# Patient Record
Sex: Male | Born: 1970
Health system: Southern US, Community
[De-identification: ages and names within clinical notes are randomized; demographics above are authoritative.]

## PROBLEM LIST (undated history)

## (undated) DIAGNOSIS — M2550 Pain in unspecified joint: Secondary | ICD-10-CM

## (undated) DIAGNOSIS — R202 Paresthesia of skin: Secondary | ICD-10-CM

## (undated) DIAGNOSIS — F419 Anxiety disorder, unspecified: Secondary | ICD-10-CM

## (undated) DIAGNOSIS — I10 Essential (primary) hypertension: Secondary | ICD-10-CM

## (undated) DIAGNOSIS — M5416 Radiculopathy, lumbar region: Secondary | ICD-10-CM

## (undated) DIAGNOSIS — M4310 Spondylolisthesis, site unspecified: Secondary | ICD-10-CM

## (undated) DIAGNOSIS — G4730 Sleep apnea, unspecified: Secondary | ICD-10-CM

## (undated) DIAGNOSIS — M549 Dorsalgia, unspecified: Secondary | ICD-10-CM

## (undated) DIAGNOSIS — R200 Anesthesia of skin: Secondary | ICD-10-CM

## (undated) DIAGNOSIS — M51369 Other intervertebral disc degeneration, lumbar region without mention of lumbar back pain or lower extremity pain: Secondary | ICD-10-CM

## (undated) DIAGNOSIS — M5136 Other intervertebral disc degeneration, lumbar region: Secondary | ICD-10-CM

## (undated) HISTORY — DX: Pain in unspecified joint: M25.50

## (undated) HISTORY — DX: Radiculopathy, lumbar region: M54.16

## (undated) HISTORY — DX: Anesthesia of skin: R20.0

## (undated) HISTORY — DX: Dorsalgia, unspecified: M54.9

## (undated) HISTORY — DX: Essential (primary) hypertension: I10

## (undated) HISTORY — DX: Other intervertebral disc degeneration, lumbar region: M51.36

## (undated) HISTORY — DX: Anesthesia of skin: R20.2

## (undated) HISTORY — DX: Other intervertebral disc degeneration, lumbar region without mention of lumbar back pain or lower extremity pain: M51.369

## (undated) HISTORY — DX: Spondylolisthesis, site unspecified: M43.10

---

## 1998-10-02 ENCOUNTER — Ambulatory Visit (HOSPITAL_COMMUNITY)
Admission: RE | Admit: 1998-10-02 | Discharge: 1998-10-02 | Payer: Self-pay | Admitting: Orthodontics and Dentofacial Orthopedics

## 1998-10-02 ENCOUNTER — Encounter: Payer: Self-pay | Admitting: Orthodontics and Dentofacial Orthopedics

## 1999-01-12 ENCOUNTER — Emergency Department (HOSPITAL_COMMUNITY): Admission: EM | Admit: 1999-01-12 | Discharge: 1999-01-12 | Payer: Self-pay | Admitting: Emergency Medicine

## 2001-03-08 ENCOUNTER — Encounter: Admission: RE | Admit: 2001-03-08 | Discharge: 2001-03-08 | Payer: Self-pay | Admitting: Urology

## 2001-03-08 ENCOUNTER — Encounter: Payer: Self-pay | Admitting: Urology

## 2001-09-15 ENCOUNTER — Encounter: Payer: Self-pay | Admitting: Urology

## 2001-09-15 ENCOUNTER — Ambulatory Visit (HOSPITAL_COMMUNITY): Admission: RE | Admit: 2001-09-15 | Discharge: 2001-09-15 | Payer: Self-pay | Admitting: Urology

## 2007-04-15 HISTORY — PX: OTHER SURGICAL HISTORY: SHX169

## 2009-02-08 ENCOUNTER — Encounter: Admission: RE | Admit: 2009-02-08 | Discharge: 2009-02-08 | Payer: Self-pay | Admitting: Family Medicine

## 2013-12-14 DIAGNOSIS — R339 Retention of urine, unspecified: Secondary | ICD-10-CM | POA: Insufficient documentation

## 2013-12-14 DIAGNOSIS — N529 Male erectile dysfunction, unspecified: Secondary | ICD-10-CM | POA: Insufficient documentation

## 2013-12-14 DIAGNOSIS — K5900 Constipation, unspecified: Secondary | ICD-10-CM | POA: Insufficient documentation

## 2013-12-14 DIAGNOSIS — K645 Perianal venous thrombosis: Secondary | ICD-10-CM | POA: Insufficient documentation

## 2015-08-29 DIAGNOSIS — I10 Essential (primary) hypertension: Secondary | ICD-10-CM | POA: Diagnosis not present

## 2016-03-26 DIAGNOSIS — I10 Essential (primary) hypertension: Secondary | ICD-10-CM | POA: Diagnosis not present

## 2016-05-09 DIAGNOSIS — Z6831 Body mass index (BMI) 31.0-31.9, adult: Secondary | ICD-10-CM | POA: Diagnosis not present

## 2016-05-09 DIAGNOSIS — N529 Male erectile dysfunction, unspecified: Secondary | ICD-10-CM | POA: Diagnosis not present

## 2016-05-09 DIAGNOSIS — N400 Enlarged prostate without lower urinary tract symptoms: Secondary | ICD-10-CM | POA: Diagnosis not present

## 2016-06-02 DIAGNOSIS — I10 Essential (primary) hypertension: Secondary | ICD-10-CM | POA: Diagnosis not present

## 2016-09-11 DIAGNOSIS — M5127 Other intervertebral disc displacement, lumbosacral region: Secondary | ICD-10-CM | POA: Diagnosis not present

## 2016-09-11 DIAGNOSIS — M9903 Segmental and somatic dysfunction of lumbar region: Secondary | ICD-10-CM | POA: Diagnosis not present

## 2016-09-11 DIAGNOSIS — M5417 Radiculopathy, lumbosacral region: Secondary | ICD-10-CM | POA: Diagnosis not present

## 2016-09-11 DIAGNOSIS — Q762 Congenital spondylolisthesis: Secondary | ICD-10-CM | POA: Diagnosis not present

## 2016-12-01 DIAGNOSIS — I10 Essential (primary) hypertension: Secondary | ICD-10-CM | POA: Diagnosis not present

## 2017-05-11 DIAGNOSIS — N138 Other obstructive and reflux uropathy: Secondary | ICD-10-CM | POA: Diagnosis not present

## 2017-05-11 DIAGNOSIS — N5201 Erectile dysfunction due to arterial insufficiency: Secondary | ICD-10-CM | POA: Diagnosis not present

## 2017-05-11 DIAGNOSIS — N401 Enlarged prostate with lower urinary tract symptoms: Secondary | ICD-10-CM | POA: Diagnosis not present

## 2017-06-05 DIAGNOSIS — I10 Essential (primary) hypertension: Secondary | ICD-10-CM | POA: Diagnosis not present

## 2017-06-18 DIAGNOSIS — J029 Acute pharyngitis, unspecified: Secondary | ICD-10-CM | POA: Diagnosis not present

## 2017-11-19 DIAGNOSIS — Z136 Encounter for screening for cardiovascular disorders: Secondary | ICD-10-CM | POA: Diagnosis not present

## 2017-11-19 DIAGNOSIS — Z0000 Encounter for general adult medical examination without abnormal findings: Secondary | ICD-10-CM | POA: Diagnosis not present

## 2018-05-12 DIAGNOSIS — N138 Other obstructive and reflux uropathy: Secondary | ICD-10-CM | POA: Diagnosis not present

## 2018-05-12 DIAGNOSIS — N5201 Erectile dysfunction due to arterial insufficiency: Secondary | ICD-10-CM | POA: Insufficient documentation

## 2018-05-12 DIAGNOSIS — N401 Enlarged prostate with lower urinary tract symptoms: Secondary | ICD-10-CM | POA: Diagnosis not present

## 2018-05-12 DIAGNOSIS — N400 Enlarged prostate without lower urinary tract symptoms: Secondary | ICD-10-CM | POA: Diagnosis not present

## 2018-05-12 DIAGNOSIS — Z8042 Family history of malignant neoplasm of prostate: Secondary | ICD-10-CM | POA: Insufficient documentation

## 2018-05-14 DIAGNOSIS — R079 Chest pain, unspecified: Secondary | ICD-10-CM | POA: Diagnosis not present

## 2018-05-14 DIAGNOSIS — I10 Essential (primary) hypertension: Secondary | ICD-10-CM | POA: Diagnosis not present

## 2018-06-01 DIAGNOSIS — R05 Cough: Secondary | ICD-10-CM | POA: Diagnosis not present

## 2018-06-01 DIAGNOSIS — B349 Viral infection, unspecified: Secondary | ICD-10-CM | POA: Diagnosis not present

## 2018-07-27 DIAGNOSIS — I10 Essential (primary) hypertension: Secondary | ICD-10-CM | POA: Diagnosis not present

## 2018-07-27 DIAGNOSIS — F418 Other specified anxiety disorders: Secondary | ICD-10-CM | POA: Diagnosis not present

## 2018-11-25 DIAGNOSIS — I10 Essential (primary) hypertension: Secondary | ICD-10-CM | POA: Diagnosis not present

## 2018-11-25 DIAGNOSIS — Z13220 Encounter for screening for lipoid disorders: Secondary | ICD-10-CM | POA: Diagnosis not present

## 2018-11-25 DIAGNOSIS — Z23 Encounter for immunization: Secondary | ICD-10-CM | POA: Diagnosis not present

## 2018-11-25 DIAGNOSIS — Z0000 Encounter for general adult medical examination without abnormal findings: Secondary | ICD-10-CM | POA: Diagnosis not present

## 2019-03-30 DIAGNOSIS — J00 Acute nasopharyngitis [common cold]: Secondary | ICD-10-CM | POA: Diagnosis not present

## 2019-03-30 DIAGNOSIS — Z03818 Encounter for observation for suspected exposure to other biological agents ruled out: Secondary | ICD-10-CM | POA: Diagnosis not present

## 2019-05-24 DIAGNOSIS — Z6835 Body mass index (BMI) 35.0-35.9, adult: Secondary | ICD-10-CM | POA: Diagnosis not present

## 2019-05-24 DIAGNOSIS — Z111 Encounter for screening for respiratory tuberculosis: Secondary | ICD-10-CM | POA: Diagnosis not present

## 2019-05-24 DIAGNOSIS — I10 Essential (primary) hypertension: Secondary | ICD-10-CM | POA: Diagnosis not present

## 2019-05-24 DIAGNOSIS — F411 Generalized anxiety disorder: Secondary | ICD-10-CM | POA: Diagnosis not present

## 2019-05-24 DIAGNOSIS — E6609 Other obesity due to excess calories: Secondary | ICD-10-CM | POA: Diagnosis not present

## 2019-06-20 DIAGNOSIS — N5201 Erectile dysfunction due to arterial insufficiency: Secondary | ICD-10-CM | POA: Diagnosis not present

## 2019-06-20 DIAGNOSIS — N138 Other obstructive and reflux uropathy: Secondary | ICD-10-CM | POA: Diagnosis not present

## 2019-06-20 DIAGNOSIS — N400 Enlarged prostate without lower urinary tract symptoms: Secondary | ICD-10-CM | POA: Diagnosis not present

## 2019-06-20 DIAGNOSIS — N401 Enlarged prostate with lower urinary tract symptoms: Secondary | ICD-10-CM | POA: Diagnosis not present

## 2019-09-19 DIAGNOSIS — M5416 Radiculopathy, lumbar region: Secondary | ICD-10-CM | POA: Diagnosis not present

## 2019-09-19 DIAGNOSIS — M545 Low back pain: Secondary | ICD-10-CM | POA: Diagnosis not present

## 2019-09-28 DIAGNOSIS — M5416 Radiculopathy, lumbar region: Secondary | ICD-10-CM | POA: Diagnosis not present

## 2019-10-04 DIAGNOSIS — M5416 Radiculopathy, lumbar region: Secondary | ICD-10-CM | POA: Diagnosis not present

## 2019-10-11 DIAGNOSIS — M5136 Other intervertebral disc degeneration, lumbar region: Secondary | ICD-10-CM | POA: Diagnosis not present

## 2019-10-21 DIAGNOSIS — M5416 Radiculopathy, lumbar region: Secondary | ICD-10-CM | POA: Diagnosis not present

## 2019-10-21 DIAGNOSIS — M5136 Other intervertebral disc degeneration, lumbar region: Secondary | ICD-10-CM | POA: Diagnosis not present

## 2019-10-21 DIAGNOSIS — M4310 Spondylolisthesis, site unspecified: Secondary | ICD-10-CM | POA: Diagnosis not present

## 2019-10-24 DIAGNOSIS — R2689 Other abnormalities of gait and mobility: Secondary | ICD-10-CM | POA: Diagnosis not present

## 2019-10-24 DIAGNOSIS — M79604 Pain in right leg: Secondary | ICD-10-CM | POA: Diagnosis not present

## 2019-10-24 DIAGNOSIS — M545 Low back pain: Secondary | ICD-10-CM | POA: Diagnosis not present

## 2019-10-26 DIAGNOSIS — M545 Low back pain: Secondary | ICD-10-CM | POA: Diagnosis not present

## 2019-10-26 DIAGNOSIS — M79604 Pain in right leg: Secondary | ICD-10-CM | POA: Diagnosis not present

## 2019-10-26 DIAGNOSIS — R2689 Other abnormalities of gait and mobility: Secondary | ICD-10-CM | POA: Diagnosis not present

## 2019-11-02 DIAGNOSIS — R2689 Other abnormalities of gait and mobility: Secondary | ICD-10-CM | POA: Diagnosis not present

## 2019-11-02 DIAGNOSIS — M79604 Pain in right leg: Secondary | ICD-10-CM | POA: Diagnosis not present

## 2019-11-02 DIAGNOSIS — M545 Low back pain: Secondary | ICD-10-CM | POA: Diagnosis not present

## 2019-11-09 DIAGNOSIS — R2689 Other abnormalities of gait and mobility: Secondary | ICD-10-CM | POA: Diagnosis not present

## 2019-11-09 DIAGNOSIS — M545 Low back pain: Secondary | ICD-10-CM | POA: Diagnosis not present

## 2019-11-09 DIAGNOSIS — M79604 Pain in right leg: Secondary | ICD-10-CM | POA: Diagnosis not present

## 2019-11-16 DIAGNOSIS — M545 Low back pain: Secondary | ICD-10-CM | POA: Diagnosis not present

## 2019-11-16 DIAGNOSIS — M79604 Pain in right leg: Secondary | ICD-10-CM | POA: Diagnosis not present

## 2019-11-16 DIAGNOSIS — R2689 Other abnormalities of gait and mobility: Secondary | ICD-10-CM | POA: Diagnosis not present

## 2019-11-21 DIAGNOSIS — R2689 Other abnormalities of gait and mobility: Secondary | ICD-10-CM | POA: Diagnosis not present

## 2019-11-21 DIAGNOSIS — F411 Generalized anxiety disorder: Secondary | ICD-10-CM | POA: Diagnosis not present

## 2019-11-21 DIAGNOSIS — M79604 Pain in right leg: Secondary | ICD-10-CM | POA: Diagnosis not present

## 2019-11-21 DIAGNOSIS — M545 Low back pain: Secondary | ICD-10-CM | POA: Diagnosis not present

## 2019-11-21 DIAGNOSIS — I10 Essential (primary) hypertension: Secondary | ICD-10-CM | POA: Diagnosis not present

## 2019-11-24 DIAGNOSIS — M79604 Pain in right leg: Secondary | ICD-10-CM | POA: Diagnosis not present

## 2019-11-24 DIAGNOSIS — M545 Low back pain: Secondary | ICD-10-CM | POA: Diagnosis not present

## 2019-11-24 DIAGNOSIS — R2689 Other abnormalities of gait and mobility: Secondary | ICD-10-CM | POA: Diagnosis not present

## 2019-11-28 DIAGNOSIS — M79604 Pain in right leg: Secondary | ICD-10-CM | POA: Diagnosis not present

## 2019-11-28 DIAGNOSIS — R2689 Other abnormalities of gait and mobility: Secondary | ICD-10-CM | POA: Diagnosis not present

## 2019-11-28 DIAGNOSIS — M545 Low back pain: Secondary | ICD-10-CM | POA: Diagnosis not present

## 2019-12-01 DIAGNOSIS — M545 Low back pain: Secondary | ICD-10-CM | POA: Diagnosis not present

## 2019-12-01 DIAGNOSIS — M79604 Pain in right leg: Secondary | ICD-10-CM | POA: Diagnosis not present

## 2019-12-01 DIAGNOSIS — R2689 Other abnormalities of gait and mobility: Secondary | ICD-10-CM | POA: Diagnosis not present

## 2019-12-05 DIAGNOSIS — M79604 Pain in right leg: Secondary | ICD-10-CM | POA: Diagnosis not present

## 2019-12-05 DIAGNOSIS — R2689 Other abnormalities of gait and mobility: Secondary | ICD-10-CM | POA: Diagnosis not present

## 2019-12-05 DIAGNOSIS — M545 Low back pain: Secondary | ICD-10-CM | POA: Diagnosis not present

## 2019-12-08 DIAGNOSIS — R2689 Other abnormalities of gait and mobility: Secondary | ICD-10-CM | POA: Diagnosis not present

## 2019-12-08 DIAGNOSIS — M545 Low back pain: Secondary | ICD-10-CM | POA: Diagnosis not present

## 2019-12-08 DIAGNOSIS — M79604 Pain in right leg: Secondary | ICD-10-CM | POA: Diagnosis not present

## 2019-12-12 DIAGNOSIS — M5416 Radiculopathy, lumbar region: Secondary | ICD-10-CM | POA: Diagnosis not present

## 2019-12-12 DIAGNOSIS — M5136 Other intervertebral disc degeneration, lumbar region: Secondary | ICD-10-CM | POA: Diagnosis not present

## 2019-12-12 DIAGNOSIS — M79604 Pain in right leg: Secondary | ICD-10-CM | POA: Diagnosis not present

## 2019-12-12 DIAGNOSIS — R2689 Other abnormalities of gait and mobility: Secondary | ICD-10-CM | POA: Diagnosis not present

## 2019-12-12 DIAGNOSIS — M4310 Spondylolisthesis, site unspecified: Secondary | ICD-10-CM | POA: Diagnosis not present

## 2019-12-12 DIAGNOSIS — M545 Low back pain: Secondary | ICD-10-CM | POA: Diagnosis not present

## 2019-12-15 DIAGNOSIS — M545 Low back pain: Secondary | ICD-10-CM | POA: Diagnosis not present

## 2019-12-15 DIAGNOSIS — R2689 Other abnormalities of gait and mobility: Secondary | ICD-10-CM | POA: Diagnosis not present

## 2019-12-15 DIAGNOSIS — M79604 Pain in right leg: Secondary | ICD-10-CM | POA: Diagnosis not present

## 2019-12-27 DIAGNOSIS — Z01818 Encounter for other preprocedural examination: Secondary | ICD-10-CM | POA: Diagnosis not present

## 2019-12-27 DIAGNOSIS — I10 Essential (primary) hypertension: Secondary | ICD-10-CM | POA: Diagnosis not present

## 2019-12-27 DIAGNOSIS — G4733 Obstructive sleep apnea (adult) (pediatric): Secondary | ICD-10-CM | POA: Diagnosis not present

## 2019-12-27 DIAGNOSIS — M4317 Spondylolisthesis, lumbosacral region: Secondary | ICD-10-CM | POA: Diagnosis not present

## 2020-02-17 ENCOUNTER — Ambulatory Visit: Payer: Self-pay | Admitting: Orthopedic Surgery

## 2020-02-21 ENCOUNTER — Other Ambulatory Visit: Payer: Self-pay

## 2020-02-24 ENCOUNTER — Other Ambulatory Visit: Payer: Self-pay

## 2020-02-24 DIAGNOSIS — I10 Essential (primary) hypertension: Secondary | ICD-10-CM | POA: Insufficient documentation

## 2020-03-06 ENCOUNTER — Ambulatory Visit: Payer: Self-pay | Admitting: Orthopedic Surgery

## 2020-03-06 DIAGNOSIS — M5416 Radiculopathy, lumbar region: Secondary | ICD-10-CM | POA: Diagnosis not present

## 2020-03-06 DIAGNOSIS — M4310 Spondylolisthesis, site unspecified: Secondary | ICD-10-CM | POA: Diagnosis not present

## 2020-03-06 NOTE — H&P (Signed)
Subjective:   Jason Moore is a pleasant 49 year old male He was have ongoing low back pain and radicular right leg pain for some time. Imaging studies demonstrate a degenerative grade 2 spondylolisthesis at L5-S1. Despite the injection therapy, physical therapy, and activity modification his overall quality-of-life his continue to deteriorate. After discussing risks and benefits, the patient has elected to move forward with surgical intervention. He is scheduled for ALIF L5-S1, PLIF L5-S1 On 03/21/20 At [facility] With Dr. Shon Baton.  Patient Active Problem List   Diagnosis Date Noted  . Hypertension 02/24/2020  . BPH with obstruction/lower urinary tract symptoms 05/12/2018  . Erectile dysfunction due to arterial insufficiency 05/12/2018  . Family history of prostate cancer in father 05/12/2018  . Constipation 12/14/2013  . Incomplete emptying of bladder 12/14/2013  . Organic impotence 12/14/2013  . Thrombosed external hemorrhoid 12/14/2013   Past Medical History:  Diagnosis Date  . Back pain   . Degeneration of lumbar intervertebral disc   . Hypertension   . Isthmic spondylolisthesis   . Joint pain   . Lumbar radiculopathy   . 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 He is alert and orientated 3. No shortness of breath, chest pain.  Heart: Regular rate and rhythm, no rubs, murmurs, or gallops  Lungs: Clear to auscultation bilaterally  Abdomen is soft and non-tender, negative loss of bowel and bladder control, no rebound tenderness. Bowel sounds 4.  Negative: skin lesions abrasions contusions  Peripheral pulses: 2+ dorsalis pedis/posterior tibialis pulses bilaterally. Compartment soft and nontender.  Gait pattern: Altered gait pattern due to severe back pain. Assistive devices: None  Neuro: Positive radicular right leg pain. Positive straight leg raise test on the right side. Positive numbness and dysesthesias in the L5 dermatome on the right side. 5/5 motor strength bilaterally in the lower extremity. Negative Babinski test, no clonus. 1+ deep tendon reflexes symmetrical in the lower extremity.  Musculoskeletal: Severe back pain with extension or rotation of the lumbar spine. No SI joint pain with direct palpation. X-rays of the lumbar spine taken 09/19/19 were reviewed. Patient has a grade 2 isthmic spondylolisthesis with advanced degenerative disc disease at L5-S1. No scoliosis. No compression fracture seen.  Lumbar MRI: completed on 09/28/19 was reviewed with the patient. It was completed at [facility]; I have independently reviewed the images as well as the radiology report. Isthmic spondylolisthesis L5-S1 with bilateral pars defect. Moderate to severe foraminal stenosis bilaterally. Moderate right foraminal disease at L4-5. Mild degenerative changes L1-4. No fracture or abnormal marrow signal changes noted.  Assessment:   Jason Moore  a very pleasant 49 year old male with low back pain and right leg  pain secondary to degenerative grade 2 spondylolisthesis at L5-S1. Attempts at conservative care including physical therapy, injection therapy, and medications have failed to significantly improve the patient's quality-of-life and therefore he would like to move all with surgical intervention.  Plan:   Anterior lumbar interbody fusion with posterior supplemental fixation of L5-S1  Since his primary issue is significant back pain I am recommending an anterior lumbar interbody fusion with posterior supplemental pedicle screw fixation at L5-S1. This would provide maximum exposure to the disc and removal of the fragments and insertion of large intervertebral cage. Since he does have and isthmic spondylolisthesis I recommend supplemental fixation posteriorly. I believe this would give him the best chance at success an improved quality-of-life. Risks and benefits of surgery were discussed with the patient. These include: Infection, bleeding, death, stroke, paralysis, ongoing or worse pain, need for additional surgery, nonunion, leak of spinal fluid, adjacent segment degeneration requiring additional fusion surgery, need for posterior decompression and/or fusion. Bleeding from major vessels, and blood clots (deep venous thrombosis)requiring additional treatment, loss in bowel and bladder control, injury to bladder, ureters, and other major abdominal organs. Additional risk for male patients: Retrograde ejaculation, and therefore infertility. Risks and benefits of spinal fusion: Infection, bleeding, death, stroke, paralysis, ongoing or worse pain, need for additional surgery, nonunion, leak of spinal fluid, adjacent segment degeneration requiring additional fusion surgery, Injury to abdominal vessels that can require anterior surgery to stop bleeding. Malposition of the cage and/or pedicle screws that could require additional surgery. Loss of bowel and bladder control. Postoperative hematoma causing neurologic  compression that could require urgent or emergent re-operation.  We will obtain preoperative medical clearance from the patient's primary care provider.  I have reviewed these medication list with him. He is not on any blood thinners. Not using any aspirin products. He is not taking any anti-inflammatory medications. He will hold his multivitamin one week prior to surgery.  Patient has been fitted for LSO brace  Patient scheduled to see vascular surgeon on 03/12/2020  All patients questions were invited and answered  Follow-up: 2 weeks postop

## 2020-03-12 ENCOUNTER — Ambulatory Visit (INDEPENDENT_AMBULATORY_CARE_PROVIDER_SITE_OTHER): Payer: BC Managed Care – PPO | Admitting: Vascular Surgery

## 2020-03-12 ENCOUNTER — Encounter: Payer: Self-pay | Admitting: Vascular Surgery

## 2020-03-12 ENCOUNTER — Other Ambulatory Visit: Payer: Self-pay

## 2020-03-12 VITALS — BP 162/100 | HR 70 | Temp 98.2°F | Resp 16 | Ht 71.5 in | Wt 260.0 lb

## 2020-03-12 DIAGNOSIS — M5137 Other intervertebral disc degeneration, lumbosacral region: Secondary | ICD-10-CM | POA: Diagnosis not present

## 2020-03-12 NOTE — Progress Notes (Signed)
[facility] [place]  Patient name: Jason Moore MRN: [id number] DOB: 1971/01/19 Sex: male  REASON FOR CONSULT: Discuss anterior exposure for L5-S1 disc fusion with Dr. Shon Baton  HPI: Jason Moore is a 49 y.o. male, here today for discussion of anterior exposure for L5-S1 ALIF.  He is here today with his wife.  He has had progressive degenerative disc disease with pain that is failed conservative treatment.  Dr. Shon Baton is recommended anterior fusion and posterior instrumentation.  He has had no prior abdominal surgery.  He has no history of cardiac disease.  He has no history of peripheral vascular occlusive disease  Past Medical History:  Diagnosis Date  . Back pain   . Degeneration of lumbar intervertebral disc   . Hypertension   . Isthmic spondylolisthesis   . Joint pain   . Lumbar radiculopathy   . Numbness and tingling     History reviewed. No pertinent family history.  SOCIAL HISTORY: Social History   Socioeconomic History  . Marital status: Married    Spouse name: Not on file  . Number of children: Not on file  . Years of education: Not on file  . Highest education level: Not on file  Occupational History  . Occupation: Parts Salesperson  Tobacco Use  . Smoking status: Never Smoker  . Smokeless tobacco: Never Used  Substance and Sexual Activity  . Alcohol use: Yes    Comment: Occasionally  . Drug use: Not on file  . Sexual activity: Not on file  Other Topics Concern  . Not on file  Social History Narrative  . Not on file   Social Determinants of Health   Financial Resource Strain:   . Difficulty of Paying Living Expenses: Not on file  Food Insecurity:   . Worried About Programme researcher, broadcasting/film/video in the Last Year: Not on file  . Ran Out of Food in the Last Year: Not on file  Transportation Needs:   . Lack of Transportation (Medical): Not on file  . Lack of Transportation (Non-Medical): Not on file  Physical Activity:   . Days  of Exercise per Week: Not on file  . Minutes of Exercise per Session: Not on file  Stress:   . Feeling of Stress : Not on file  Social Connections:   . Frequency of Communication with Friends and Family: Not on file  . Frequency of Social Gatherings with Friends and Family: Not on file  . Attends Religious Services: Not on file  . Active Member of Clubs or Organizations: Not on file  . Attends Banker Meetings: Not on file  . Marital Status: Not on file  Intimate Partner Violence:   . Fear of Current or Ex-Partner: Not on file  . Emotionally Abused: Not on file  . Physically Abused: Not on file  . Sexually Abused: Not on file    No Known Allergies  Current Outpatient Medications  Medication Sig Dispense Refill  . ascorbic acid (VITAMIN C) 500 MG tablet Take 500 mg by mouth daily.    Marland Kitchen BYSTOLIC 5 MG tablet Take 5 mg by mouth daily.    . Cholecalciferol (VITAMIN D3) 1.25 MG (50000 UT) CAPS Take by mouth.    . escitalopram (LEXAPRO) 5 MG tablet Take 5 mg by mouth daily.    Marland Kitchen gabapentin (NEURONTIN) 100 MG capsule Take 100 mg by mouth daily.    . Multiple Vitamin (THERA) TABS Take 1 tablet by mouth  daily.    . Omega-3 Fatty Acids (FISH OIL) 1200 MG CAPS Take by mouth.    . tadalafil (CIALIS) 5 MG tablet Take by mouth.    . traMADol (ULTRAM) 50 MG tablet Take by mouth.     No current facility-administered medications for this visit.    REVIEW OF SYSTEMS:  [X]  denotes positive finding, [ ]  denotes negative finding Cardiac  Comments:  Chest pain or chest pressure: x   Shortness of breath upon exertion:    Short of breath when lying flat:    Irregular heart rhythm:        Vascular    Pain in calf, thigh, or hip brought on by ambulation:    Pain in feet at night that wakes you up from your sleep:     Blood clot in your veins:    Leg swelling:         Pulmonary    Oxygen at home:    Productive cough:     Wheezing:         Neurologic    Sudden weakness in arms  or legs:     Sudden numbness in arms or legs:     Sudden onset of difficulty speaking or slurred speech:    Temporary loss of vision in one eye:     Problems with dizziness:         Gastrointestinal    Blood in stool:     Vomited blood:         Genitourinary    Burning when urinating:     Blood in urine:        Psychiatric    Major depression:         Hematologic    Bleeding problems:    Problems with blood clotting too easily:        Skin    Rashes or ulcers:        Constitutional    Fever or chills:      PHYSICAL EXAM: Vitals:   03/12/20 1549  BP: (!) 162/100  Pulse: 70  Resp: 16  Temp: 98.2 F (36.8 C)  TempSrc: Other (Comment)  SpO2: 95%  Weight: 260 lb (117.9 kg)  Height: 5' 11.5" (1.816 m)    GENERAL: The patient is a well-nourished male, in no acute distress. The vital signs are documented above. VASCULAR: Carotid arteries without bruits bilaterally.  2+ radial pulses bilaterally 2+ dorsalis pedis pulses bilaterally PULMONARY: There is good air exchange ABDOMEN: Soft and non-tender  MUSCULOSKELETAL: There are no major deformities or cyanosis. NEUROLOGIC: No focal weakness or paresthesias are detected. SKIN: There are no ulcers or rashes noted. PSYCHIATRIC: The patient has a normal affect.  DATA:   Plain films from June 2021 were reviewed showing no evidence of calcification of his aorta iliac segments  MRI from June 2021 were reviewed showing normal location of his aortic and iliac bifurcation  MEDICAL ISSUES:  Had long discussion with the patient and his wife regarding my role for exposure.  Explained to Dr. July 2021 feels his best treatment option is anterior fusion.  I explained the location of the low transverse incision from the midline to the left.  Explained mobilization of the rectus muscle and entry into the retroperitoneal space.  I discussed mobilization of the left ureter and arterial and venous structures overlying the spine.  Discussed the  potential of injury to all these particularly venous injury.  Also discussed the unlikely but possible incidence of retrograde  ejaculation.  I do not see any contraindication to surgery The patient has had no prior intra-abdominal surgery He does have moderate obesity with a BMI of 36 He has no evidence of atherosclerotic disease of his aorta iliac segments  We will proceed as planned on 03/21/2020 with L5-S1 ALIF   Larina Earthly, MD [facility] [place] Office phone [phone]

## 2020-03-19 ENCOUNTER — Encounter (HOSPITAL_COMMUNITY): Payer: Self-pay

## 2020-03-19 ENCOUNTER — Other Ambulatory Visit (HOSPITAL_COMMUNITY)
Admission: RE | Admit: 2020-03-19 | Discharge: 2020-03-19 | Disposition: A | Discharge status: Home / Self Care | Payer: BC Managed Care – PPO | Source: Ambulatory Visit | Attending: Orthopedic Surgery | Admitting: Orthopedic Surgery

## 2020-03-19 ENCOUNTER — Encounter (HOSPITAL_COMMUNITY)
Admission: RE | Admit: 2020-03-19 | Discharge: 2020-03-19 | Disposition: A | Discharge status: Home / Self Care | Payer: BC Managed Care – PPO | Source: Ambulatory Visit | Attending: Orthopedic Surgery | Admitting: Orthopedic Surgery

## 2020-03-19 ENCOUNTER — Ambulatory Visit (HOSPITAL_COMMUNITY)
Admission: RE | Admit: 2020-03-19 | Discharge: 2020-03-19 | Disposition: A | Discharge status: Home / Self Care | Payer: BC Managed Care – PPO | Source: Ambulatory Visit | Attending: Orthopedic Surgery | Admitting: Orthopedic Surgery

## 2020-03-19 ENCOUNTER — Other Ambulatory Visit: Payer: Self-pay

## 2020-03-19 DIAGNOSIS — I9752 Accidental puncture and laceration of a circulatory system organ or structure during other procedure: Secondary | ICD-10-CM | POA: Diagnosis not present

## 2020-03-19 DIAGNOSIS — S35516A Injury of unspecified iliac vein, initial encounter: Secondary | ICD-10-CM | POA: Diagnosis not present

## 2020-03-19 DIAGNOSIS — Y838 Other surgical procedures as the cause of abnormal reaction of the patient, or of later complication, without mention of misadventure at the time of the procedure: Secondary | ICD-10-CM | POA: Diagnosis not present

## 2020-03-19 DIAGNOSIS — Z01818 Encounter for other preprocedural examination: Secondary | ICD-10-CM | POA: Insufficient documentation

## 2020-03-19 DIAGNOSIS — M5117 Intervertebral disc disorders with radiculopathy, lumbosacral region: Secondary | ICD-10-CM | POA: Diagnosis not present

## 2020-03-19 DIAGNOSIS — Z981 Arthrodesis status: Secondary | ICD-10-CM | POA: Diagnosis not present

## 2020-03-19 DIAGNOSIS — M5116 Intervertebral disc disorders with radiculopathy, lumbar region: Secondary | ICD-10-CM | POA: Diagnosis not present

## 2020-03-19 DIAGNOSIS — D62 Acute posthemorrhagic anemia: Secondary | ICD-10-CM | POA: Diagnosis not present

## 2020-03-19 DIAGNOSIS — I10 Essential (primary) hypertension: Secondary | ICD-10-CM | POA: Diagnosis not present

## 2020-03-19 DIAGNOSIS — G4730 Sleep apnea, unspecified: Secondary | ICD-10-CM | POA: Diagnosis not present

## 2020-03-19 DIAGNOSIS — Z20822 Contact with and (suspected) exposure to covid-19: Secondary | ICD-10-CM | POA: Diagnosis not present

## 2020-03-19 DIAGNOSIS — Z5309 Procedure and treatment not carried out because of other contraindication: Secondary | ICD-10-CM | POA: Diagnosis not present

## 2020-03-19 DIAGNOSIS — S35514A Injury of right iliac vein, initial encounter: Secondary | ICD-10-CM | POA: Diagnosis not present

## 2020-03-19 DIAGNOSIS — M2550 Pain in unspecified joint: Secondary | ICD-10-CM | POA: Diagnosis not present

## 2020-03-19 DIAGNOSIS — S35515A Injury of left iliac vein, initial encounter: Secondary | ICD-10-CM | POA: Diagnosis not present

## 2020-03-19 DIAGNOSIS — G8918 Other acute postprocedural pain: Secondary | ICD-10-CM | POA: Diagnosis not present

## 2020-03-19 DIAGNOSIS — M4310 Spondylolisthesis, site unspecified: Secondary | ICD-10-CM | POA: Diagnosis not present

## 2020-03-19 DIAGNOSIS — I959 Hypotension, unspecified: Secondary | ICD-10-CM | POA: Diagnosis not present

## 2020-03-19 DIAGNOSIS — Z01812 Encounter for preprocedural laboratory examination: Secondary | ICD-10-CM | POA: Insufficient documentation

## 2020-03-19 DIAGNOSIS — Z539 Procedure and treatment not carried out, unspecified reason: Secondary | ICD-10-CM | POA: Diagnosis not present

## 2020-03-19 DIAGNOSIS — M4317 Spondylolisthesis, lumbosacral region: Secondary | ICD-10-CM | POA: Diagnosis not present

## 2020-03-19 DIAGNOSIS — M5137 Other intervertebral disc degeneration, lumbosacral region: Secondary | ICD-10-CM | POA: Diagnosis not present

## 2020-03-19 HISTORY — DX: Sleep apnea, unspecified: G47.30

## 2020-03-19 LAB — BASIC METABOLIC PANEL
Anion gap: 11 (ref 5–15)
BUN: 6 mg/dL (ref 6–20)
CO2: 23 mmol/L (ref 22–32)
Calcium: 9.4 mg/dL (ref 8.9–10.3)
Chloride: 105 mmol/L (ref 98–111)
Creatinine, Ser: 1.21 mg/dL (ref 0.61–1.24)
GFR, Estimated: >60 mL/min (ref >60)
Glucose, Bld: 104 mg/dL — ABNORMAL HIGH (ref 70–99)
Potassium: 3.8 mmol/L (ref 3.5–5.1)
Sodium: 139 mmol/L (ref 135–145)

## 2020-03-19 LAB — URINALYSIS, ROUTINE W REFLEX MICROSCOPIC
Bilirubin Urine: NEGATIVE
Glucose, UA: NEGATIVE mg/dL
Hgb urine dipstick: NEGATIVE
Ketones, ur: NEGATIVE mg/dL
Leukocytes,Ua: NEGATIVE
Nitrite: NEGATIVE
Protein, ur: NEGATIVE mg/dL
Specific Gravity, Urine: 1.015 (ref 1.005–1.030)
pH: 6 (ref 5.0–8.0)

## 2020-03-19 LAB — CBC
HCT: 48.6 % (ref 39.0–52.0)
Hemoglobin: 16 g/dL (ref 13.0–17.0)
MCH: 31.3 pg (ref 26.0–34.0)
MCHC: 32.9 g/dL (ref 30.0–36.0)
MCV: 94.9 fL (ref 80.0–100.0)
Platelets: 246 10*3/uL (ref 150–400)
RBC: 5.12 MIL/uL (ref 4.22–5.81)
RDW: 12.9 % (ref 11.5–15.5)
WBC: 4.6 10*3/uL (ref 4.0–10.5)
nRBC: 0 % (ref 0.0–0.2)

## 2020-03-19 LAB — SURGICAL PCR SCREEN
MRSA, PCR: NEGATIVE
Staphylococcus aureus: NEGATIVE

## 2020-03-19 LAB — PROTIME-INR
INR: 1.1 (ref 0.8–1.2)
Prothrombin Time: 13.3 seconds (ref 11.4–15.2)

## 2020-03-19 LAB — APTT: aPTT: 31 seconds (ref 24–36)

## 2020-03-19 LAB — SARS CORONAVIRUS 2 (TAT 6-24 HRS): SARS Coronavirus 2: NEGATIVE

## 2020-03-19 NOTE — Progress Notes (Signed)
[id number] [place], [place] - [address] [place] [address] Phone: [phone] Fax: [phone]      Your procedure is scheduled on Wednesday December 8th.  Report to [facility] Main Entrance "A" at 6:30 A.M., and check in at the Admitting office.  Call this number if you have problems the morning of surgery:  [phone]  Call [phone] if you have any questions prior to your surgery date Monday-Friday 8am-4pm    Remember:  Do not eat or drink anything after midnight the night before your surgery   Take these medicines the morning of surgery with A SIP OF WATER   BYSTOLIC 5 MG tablet  IF NEEDED  acetaminophen (TYLENOL) 500 MG tablet   traMADol (ULTRAM) 50 MG tablet   As of today, STOP taking any Aspirin (unless otherwise instructed by your surgeon) Aleve, Naproxen, Ibuprofen, Motrin, Advil, Goody's, BC's, all herbal medications, fish oil, and all vitamins.                      Do not wear jewelry            Do not wear lotions, powders, colognes, or deodorant.            Do not shave 48 hours prior to surgery.  Men may shave face and neck.            Do not bring valuables to the hospital.            [facility] is not responsible for any belongings or valuables.  Do NOT Smoke (Tobacco/Vaping) or drink Alcohol 24 hours prior to your procedure If you use a CPAP at night, you may bring all equipment for your overnight stay.   Contacts, glasses, dentures or bridgework may not be worn into surgery.      For patients admitted to the hospital, discharge time will be determined by your treatment team.   Patients discharged the day of surgery will not be allowed to drive home, and someone needs to stay with them for 24 hours.    Special instructions:   [place]- Preparing For Surgery  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, you can play an important role. Because skin is not sterile, your skin needs to be as free of germs as possible. You can  reduce the number of germs on your skin by washing with CHG (chlorahexidine gluconate) Soap before surgery.  CHG is an antiseptic cleaner which kills germs and bonds with the skin to continue killing germs even after washing.    Oral Hygiene is also important to reduce your risk of infection.  Remember - BRUSH YOUR TEETH THE MORNING OF SURGERY WITH YOUR REGULAR TOOTHPASTE  Please do not use if you have an allergy to CHG or antibacterial soaps. If your skin becomes reddened/irritated stop using the CHG.  Do not shave (including legs and underarms) for at least 48 hours prior to first CHG shower. It is OK to shave your face.  Please follow these instructions carefully.   1. Shower the NIGHT BEFORE SURGERY and the MORNING OF SURGERY with CHG Soap.   2. If you chose to wash your hair, wash your hair first as usual with your normal shampoo.  3. After you shampoo, rinse your hair and body thoroughly to remove the shampoo.  4. Use CHG as you would any other liquid soap. You can apply CHG directly to the skin and wash gently with a scrungie or  a clean washcloth.   5. Apply the CHG Soap to your body ONLY FROM THE NECK DOWN.  Do not use on open wounds or open sores. Avoid contact with your eyes, ears, mouth and genitals (private parts). Wash Face and genitals (private parts)  with your normal soap.   6. Wash thoroughly, paying special attention to the area where your surgery will be performed.  7. Thoroughly rinse your body with warm water from the neck down.  8. DO NOT shower/wash with your normal soap after using and rinsing off the CHG Soap.  9. Pat yourself dry with a CLEAN TOWEL.  10. Wear CLEAN PAJAMAS to bed the night before surgery  11. Place CLEAN SHEETS on your bed the night of your first shower and DO NOT SLEEP WITH PETS.   Day of Surgery: Wear Clean/Comfortable clothing the morning of surgery Do not apply any deodorants/lotions.   Remember to brush your teeth WITH YOUR REGULAR  TOOTHPASTE.   Please read over the following fact sheets that you were given.

## 2020-03-19 NOTE — Progress Notes (Signed)
PCP:  Dr. Evert Kohl Cardiologist:  Denies  EKG:  03/19/20 CXR:  03/19/20 ECHO:  Denies Stress Test:  Denies Cardiac Cath:  Denies  Fasting Blood Sugar-  N/A Checks Blood Sugar__N/A_ times a day  OSA/CPAP:  Yes, wears cpap nightly.  Will bring DOS.   ASA/Blood Thinners:  NO  Covid test 03/19/20  Anesthesia Review:  Yes, per MD request.   Patient denies shortness of breath, fever, cough, and chest pain at PAT appointment.  Patient verbalized understanding of instructions provided today at the PAT appointment.  Patient asked to review instructions at home and day of surgery.

## 2020-03-20 ENCOUNTER — Ambulatory Visit: Payer: Self-pay | Admitting: Orthopedic Surgery

## 2020-03-20 NOTE — Anesthesia Preprocedure Evaluation (Addendum)
Anesthesia Evaluation  Patient identified by MRN, date of birth, ID band Patient awake    Reviewed: Allergy & Precautions, NPO status , Patient's Chart, lab work & pertinent test results  Airway Mallampati: II  TM Distance: >3 FB Neck ROM: Full    Dental no notable dental hx.    Pulmonary sleep apnea and Continuous Positive Airway Pressure Ventilation ,    Pulmonary exam normal breath sounds clear to auscultation       Cardiovascular Exercise Tolerance: Good hypertension, Normal cardiovascular exam Rhythm:Regular Rate:Normal     Neuro/Psych  Neuromuscular disease negative psych ROS   GI/Hepatic negative GI ROS, Neg liver ROS,   Endo/Other  Morbid obesity  Renal/GU negative Renal ROS  negative genitourinary   Musculoskeletal  (+) Arthritis , Osteoarthritis,    Abdominal Normal abdominal exam  (+)   Peds negative pediatric ROS (+)  Hematology negative hematology ROS (+)   Anesthesia Other Findings   Reproductive/Obstetrics negative OB ROS                           Anesthesia Physical Anesthesia Plan  ASA: II  Anesthesia Plan: General and Regional   Post-op Pain Management: GA combined w/ Regional for post-op pain   Induction:   PONV Risk Score and Plan: 2 and Ondansetron, Dexamethasone, Propofol infusion and Midazolam  Airway Management Planned: Oral ETT  Additional Equipment:   Intra-op Plan:   Post-operative Plan: Extubation in OR  Informed Consent: I have reviewed the patients History and Physical, chart, labs and discussed the procedure including the risks, benefits and alternatives for the proposed anesthesia with the patient or authorized representative who has indicated his/her understanding and acceptance.     Dental advisory given  Plan Discussed with: Anesthesiologist, Surgeon and CRNA  Anesthesia Plan Comments: (GETA. 2 PIV. TAP block with exparel for postop pain  control as requested by dr. Shon Baton)       Anesthesia Quick Evaluation

## 2020-03-21 ENCOUNTER — Inpatient Hospital Stay (HOSPITAL_COMMUNITY): Payer: BC Managed Care – PPO | Admitting: Physician Assistant

## 2020-03-21 ENCOUNTER — Inpatient Hospital Stay (HOSPITAL_COMMUNITY): Payer: BC Managed Care – PPO

## 2020-03-21 ENCOUNTER — Encounter (HOSPITAL_COMMUNITY): Payer: Self-pay | Admitting: Orthopedic Surgery

## 2020-03-21 ENCOUNTER — Inpatient Hospital Stay (HOSPITAL_COMMUNITY)
Admission: RE | Admit: 2020-03-21 | Discharge: 2020-03-23 | DRG: 981 | Disposition: A | Discharge status: Home / Self Care | Payer: BC Managed Care – PPO | Attending: Orthopedic Surgery | Admitting: Orthopedic Surgery

## 2020-03-21 ENCOUNTER — Encounter (HOSPITAL_COMMUNITY)
Admission: RE | Disposition: A | Discharge status: Home / Self Care | Payer: Self-pay | Source: Home / Self Care | Attending: Orthopedic Surgery

## 2020-03-21 ENCOUNTER — Other Ambulatory Visit: Payer: Self-pay

## 2020-03-21 ENCOUNTER — Inpatient Hospital Stay (HOSPITAL_COMMUNITY): Payer: BC Managed Care – PPO | Admitting: Anesthesiology

## 2020-03-21 DIAGNOSIS — Z20822 Contact with and (suspected) exposure to covid-19: Secondary | ICD-10-CM | POA: Diagnosis present

## 2020-03-21 DIAGNOSIS — M2550 Pain in unspecified joint: Secondary | ICD-10-CM | POA: Diagnosis present

## 2020-03-21 DIAGNOSIS — I10 Essential (primary) hypertension: Secondary | ICD-10-CM | POA: Diagnosis present

## 2020-03-21 DIAGNOSIS — Z981 Arthrodesis status: Secondary | ICD-10-CM | POA: Diagnosis not present

## 2020-03-21 DIAGNOSIS — I959 Hypotension, unspecified: Secondary | ICD-10-CM | POA: Diagnosis not present

## 2020-03-21 DIAGNOSIS — S35515A Injury of left iliac vein, initial encounter: Secondary | ICD-10-CM | POA: Diagnosis not present

## 2020-03-21 DIAGNOSIS — M51379 Other intervertebral disc degeneration, lumbosacral region without mention of lumbar back pain or lower extremity pain: Secondary | ICD-10-CM | POA: Diagnosis present

## 2020-03-21 DIAGNOSIS — Z419 Encounter for procedure for purposes other than remedying health state, unspecified: Secondary | ICD-10-CM

## 2020-03-21 DIAGNOSIS — G4730 Sleep apnea, unspecified: Secondary | ICD-10-CM | POA: Diagnosis present

## 2020-03-21 DIAGNOSIS — I9752 Accidental puncture and laceration of a circulatory system organ or structure during other procedure: Secondary | ICD-10-CM | POA: Diagnosis not present

## 2020-03-21 DIAGNOSIS — Z5309 Procedure and treatment not carried out because of other contraindication: Secondary | ICD-10-CM | POA: Diagnosis not present

## 2020-03-21 DIAGNOSIS — S35514A Injury of right iliac vein, initial encounter: Secondary | ICD-10-CM | POA: Diagnosis not present

## 2020-03-21 DIAGNOSIS — D62 Acute posthemorrhagic anemia: Secondary | ICD-10-CM | POA: Diagnosis not present

## 2020-03-21 DIAGNOSIS — M4317 Spondylolisthesis, lumbosacral region: Secondary | ICD-10-CM | POA: Diagnosis present

## 2020-03-21 DIAGNOSIS — M5116 Intervertebral disc disorders with radiculopathy, lumbar region: Secondary | ICD-10-CM | POA: Diagnosis present

## 2020-03-21 DIAGNOSIS — M4310 Spondylolisthesis, site unspecified: Secondary | ICD-10-CM | POA: Diagnosis present

## 2020-03-21 DIAGNOSIS — S35516A Injury of unspecified iliac vein, initial encounter: Secondary | ICD-10-CM | POA: Diagnosis not present

## 2020-03-21 DIAGNOSIS — Y838 Other surgical procedures as the cause of abnormal reaction of the patient, or of later complication, without mention of misadventure at the time of the procedure: Secondary | ICD-10-CM | POA: Diagnosis not present

## 2020-03-21 DIAGNOSIS — M5137 Other intervertebral disc degeneration, lumbosacral region: Secondary | ICD-10-CM | POA: Diagnosis present

## 2020-03-21 HISTORY — PX: ANTERIOR LUMBAR FUSION: SHX1170

## 2020-03-21 HISTORY — PX: ABDOMINAL EXPOSURE: SHX5708

## 2020-03-21 LAB — GLUCOSE, CAPILLARY: Glucose-Capillary: 186 mg/dL — ABNORMAL HIGH (ref 70–99)

## 2020-03-21 LAB — HEMOGLOBIN AND HEMATOCRIT, BLOOD
HCT: 40.5 % (ref 39.0–52.0)
Hemoglobin: 12.9 g/dL — ABNORMAL LOW (ref 13.0–17.0)

## 2020-03-21 LAB — ABO/RH: ABO/RH(D): B NEG

## 2020-03-21 LAB — PREPARE RBC (CROSSMATCH)

## 2020-03-21 SURGERY — ANTERIOR LUMBAR FUSION 1 LEVEL
Anesthesia: Regional | Site: Spine Lumbar

## 2020-03-21 MED ORDER — MIDAZOLAM HCL 5 MG/5ML IJ SOLN
INTRAMUSCULAR | Status: DC | PRN
  Administered 2020-03-21: 2 mg via INTRAVENOUS

## 2020-03-21 MED ORDER — CEFAZOLIN SODIUM-DEXTROSE 2-4 GM/100ML-% IV SOLN
2.0000 g | INTRAVENOUS | Status: AC
  Administered 2020-03-21 (×2): 2 g via INTRAVENOUS
  Filled 2020-03-21: qty 100

## 2020-03-21 MED ORDER — ASPIRIN EC 81 MG PO TBEC: 81.0000 mg | DELAYED_RELEASE_TABLET | Freq: Every day | ORAL | 2 refills | Status: DC

## 2020-03-21 MED ORDER — SODIUM CHLORIDE 0.9 % IV SOLN: INTRAVENOUS | Status: DC | PRN

## 2020-03-21 MED ORDER — ONDANSETRON HCL 4 MG PO TABS: 4.0000 mg | ORAL_TABLET | Freq: Four times a day (QID) | ORAL | Status: DC | PRN

## 2020-03-21 MED ORDER — SODIUM CHLORIDE 0.9% FLUSH
3.0000 mL | Freq: Two times a day (BID) | INTRAVENOUS | Status: DC
  Administered 2020-03-22: 3 mL via INTRAVENOUS

## 2020-03-21 MED ORDER — BUPIVACAINE HCL (PF) 0.5 % IJ SOLN
INTRAMUSCULAR | Status: DC | PRN
  Administered 2020-03-21: 20 mL

## 2020-03-21 MED ORDER — GABAPENTIN 100 MG PO CAPS
100.0000 mg | ORAL_CAPSULE | Freq: Every evening | ORAL | Status: DC | PRN
  Filled 2020-03-21: qty 1

## 2020-03-21 MED ORDER — OXYCODONE HCL 5 MG PO TABS
10.0000 mg | ORAL_TABLET | ORAL | Status: DC | PRN
  Administered 2020-03-22 (×4): 10 mg via ORAL
  Filled 2020-03-21 (×5): qty 2

## 2020-03-21 MED ORDER — ACETAMINOPHEN 650 MG RE SUPP: 650.0000 mg | RECTAL | Status: DC | PRN

## 2020-03-21 MED ORDER — SODIUM CHLORIDE 0.9% IV SOLUTION: Freq: Once | INTRAVENOUS | Status: DC

## 2020-03-21 MED ORDER — CHLORHEXIDINE GLUCONATE 4 % EX LIQD: 60.0000 mL | Freq: Once | CUTANEOUS | Status: DC

## 2020-03-21 MED ORDER — HYDROMORPHONE HCL 1 MG/ML IJ SOLN
0.2500 mg | INTRAMUSCULAR | Status: DC | PRN
  Administered 2020-03-21: 0.25 mg via INTRAVENOUS

## 2020-03-21 MED ORDER — PROPOFOL 10 MG/ML IV BOLUS
INTRAVENOUS | Status: AC
  Filled 2020-03-21: qty 40

## 2020-03-21 MED ORDER — LACTATED RINGERS IV SOLN: INTRAVENOUS | Status: DC

## 2020-03-21 MED ORDER — ROCURONIUM BROMIDE 10 MG/ML (PF) SYRINGE
PREFILLED_SYRINGE | INTRAVENOUS | Status: DC | PRN
  Administered 2020-03-21: 70 mg via INTRAVENOUS
  Administered 2020-03-21: 20 mg via INTRAVENOUS
  Administered 2020-03-21: 30 mg via INTRAVENOUS
  Administered 2020-03-21 (×2): 20 mg via INTRAVENOUS

## 2020-03-21 MED ORDER — 0.9 % SODIUM CHLORIDE (POUR BTL) OPTIME
TOPICAL | Status: DC | PRN
  Administered 2020-03-21: 1000 mL

## 2020-03-21 MED ORDER — VASOPRESSIN 20 UNIT/ML IV SOLN
INTRAVENOUS | Status: DC | PRN
  Administered 2020-03-21: 1 [IU] via INTRAVENOUS
  Administered 2020-03-21 (×2): 2 [IU] via INTRAVENOUS

## 2020-03-21 MED ORDER — MENTHOL 3 MG MT LOZG: 1.0000 | LOZENGE | OROMUCOSAL | Status: DC | PRN

## 2020-03-21 MED ORDER — METHOCARBAMOL 1000 MG/10ML IJ SOLN
500.0000 mg | Freq: Four times a day (QID) | INTRAVENOUS | Status: DC | PRN
  Filled 2020-03-21: qty 5

## 2020-03-21 MED ORDER — FENTANYL CITRATE (PF) 100 MCG/2ML IJ SOLN: 25.0000 ug | INTRAMUSCULAR | Status: DC | PRN

## 2020-03-21 MED ORDER — PHENYLEPHRINE HCL (PRESSORS) 10 MG/ML IV SOLN
INTRAVENOUS | Status: DC | PRN
  Administered 2020-03-21: 40 ug via INTRAVENOUS

## 2020-03-21 MED ORDER — FENTANYL CITRATE (PF) 250 MCG/5ML IJ SOLN
INTRAMUSCULAR | Status: AC
  Filled 2020-03-21: qty 5

## 2020-03-21 MED ORDER — ONDANSETRON HCL 4 MG/2ML IJ SOLN: 4.0000 mg | Freq: Four times a day (QID) | INTRAMUSCULAR | Status: DC | PRN

## 2020-03-21 MED ORDER — SODIUM CHLORIDE 0.9% FLUSH: 3.0000 mL | INTRAVENOUS | Status: DC | PRN

## 2020-03-21 MED ORDER — DEXAMETHASONE SODIUM PHOSPHATE 10 MG/ML IJ SOLN
INTRAMUSCULAR | Status: DC | PRN
  Administered 2020-03-21: 10 mg via INTRAVENOUS

## 2020-03-21 MED ORDER — ORAL CARE MOUTH RINSE: 15.0000 mL | Freq: Once | OROMUCOSAL | Status: AC

## 2020-03-21 MED ORDER — PROMETHAZINE HCL 25 MG/ML IJ SOLN: 6.2500 mg | INTRAMUSCULAR | Status: DC | PRN

## 2020-03-21 MED ORDER — MORPHINE SULFATE (PF) 2 MG/ML IV SOLN
2.0000 mg | INTRAVENOUS | Status: AC | PRN
  Administered 2020-03-21: 2 mg via INTRAVENOUS
  Filled 2020-03-21: qty 1

## 2020-03-21 MED ORDER — ALBUMIN HUMAN 5 % IV SOLN: INTRAVENOUS | Status: DC | PRN

## 2020-03-21 MED ORDER — OXYCODONE-ACETAMINOPHEN 10-325 MG PO TABS: 1.0000 | ORAL_TABLET | Freq: Four times a day (QID) | ORAL | 0 refills | Status: AC | PRN

## 2020-03-21 MED ORDER — MIDAZOLAM HCL 2 MG/2ML IJ SOLN
INTRAMUSCULAR | Status: AC
  Filled 2020-03-21: qty 2

## 2020-03-21 MED ORDER — PHENOL 1.4 % MT LIQD: 1.0000 | OROMUCOSAL | Status: DC | PRN

## 2020-03-21 MED ORDER — ESCITALOPRAM OXALATE 10 MG PO TABS
5.0000 mg | ORAL_TABLET | Freq: Every day | ORAL | Status: DC
  Administered 2020-03-22: 5 mg via ORAL
  Filled 2020-03-21: qty 0.5
  Filled 2020-03-21: qty 1
  Filled 2020-03-21: qty 0.5

## 2020-03-21 MED ORDER — METHOCARBAMOL 500 MG PO TABS
500.0000 mg | ORAL_TABLET | Freq: Four times a day (QID) | ORAL | Status: DC | PRN
  Administered 2020-03-22 (×2): 500 mg via ORAL
  Filled 2020-03-21 (×2): qty 1

## 2020-03-21 MED ORDER — PROPOFOL 500 MG/50ML IV EMUL
INTRAVENOUS | Status: DC | PRN
  Administered 2020-03-21: 50 ug/kg/min via INTRAVENOUS

## 2020-03-21 MED ORDER — LIDOCAINE 2% (20 MG/ML) 5 ML SYRINGE
INTRAMUSCULAR | Status: DC | PRN
  Administered 2020-03-21: 100 mg via INTRAVENOUS

## 2020-03-21 MED ORDER — EPINEPHRINE 1 MG/10ML IJ SOSY
PREFILLED_SYRINGE | INTRAMUSCULAR | Status: DC | PRN
  Administered 2020-03-21: .2 mg via INTRAVENOUS

## 2020-03-21 MED ORDER — ACETAMINOPHEN 500 MG PO TABS
1000.0000 mg | ORAL_TABLET | Freq: Once | ORAL | Status: AC
  Administered 2020-03-21: 1000 mg via ORAL
  Filled 2020-03-21: qty 2

## 2020-03-21 MED ORDER — HEMOSTATIC AGENTS (NO CHARGE) OPTIME: TOPICAL | Status: DC | PRN

## 2020-03-21 MED ORDER — CHLORHEXIDINE GLUCONATE 0.12 % MT SOLN
15.0000 mL | Freq: Once | OROMUCOSAL | Status: AC
  Administered 2020-03-21: 15 mL via OROMUCOSAL
  Filled 2020-03-21: qty 15

## 2020-03-21 MED ORDER — ONDANSETRON HCL 4 MG PO TABS: 4.0000 mg | ORAL_TABLET | Freq: Three times a day (TID) | ORAL | 0 refills | Status: DC | PRN

## 2020-03-21 MED ORDER — CEFAZOLIN SODIUM-DEXTROSE 1-4 GM/50ML-% IV SOLN
1.0000 g | Freq: Three times a day (TID) | INTRAVENOUS | Status: AC
  Administered 2020-03-21 – 2020-03-22 (×2): 1 g via INTRAVENOUS
  Filled 2020-03-21 (×2): qty 50

## 2020-03-21 MED ORDER — KETAMINE HCL 10 MG/ML IJ SOLN
INTRAMUSCULAR | Status: DC | PRN
  Administered 2020-03-21: 30 mg via INTRAVENOUS

## 2020-03-21 MED ORDER — ACETAMINOPHEN 325 MG PO TABS
650.0000 mg | ORAL_TABLET | ORAL | Status: DC | PRN
  Administered 2020-03-21 – 2020-03-22 (×3): 650 mg via ORAL
  Filled 2020-03-21 (×3): qty 2

## 2020-03-21 MED ORDER — OXYCODONE HCL 5 MG PO TABS
5.0000 mg | ORAL_TABLET | ORAL | Status: DC | PRN
  Administered 2020-03-21 – 2020-03-22 (×2): 5 mg via ORAL
  Filled 2020-03-21: qty 1

## 2020-03-21 MED ORDER — LACTATED RINGERS IV SOLN: INTRAVENOUS | Status: DC | PRN

## 2020-03-21 MED ORDER — VASOPRESSIN 20 UNIT/ML IV SOLN
INTRAVENOUS | Status: AC
  Filled 2020-03-21: qty 1

## 2020-03-21 MED ORDER — FENTANYL CITRATE (PF) 250 MCG/5ML IJ SOLN
INTRAMUSCULAR | Status: DC | PRN
  Administered 2020-03-21 (×4): 50 ug via INTRAVENOUS

## 2020-03-21 MED ORDER — NEBIVOLOL HCL 5 MG PO TABS
5.0000 mg | ORAL_TABLET | Freq: Every day | ORAL | Status: DC
  Administered 2020-03-22: 5 mg via ORAL
  Filled 2020-03-21 (×2): qty 1

## 2020-03-21 MED ORDER — GABAPENTIN 300 MG PO CAPS: 300.0000 mg | ORAL_CAPSULE | Freq: Three times a day (TID) | ORAL | 0 refills | Status: DC | PRN

## 2020-03-21 MED ORDER — PHENYLEPHRINE HCL-NACL 10-0.9 MG/250ML-% IV SOLN
INTRAVENOUS | Status: DC | PRN
  Administered 2020-03-21: 20 ug/min via INTRAVENOUS

## 2020-03-21 MED ORDER — PROPOFOL 10 MG/ML IV BOLUS
INTRAVENOUS | Status: DC | PRN
  Administered 2020-03-21 (×2): 50 mg via INTRAVENOUS
  Administered 2020-03-21: 150 mg via INTRAVENOUS

## 2020-03-21 MED ORDER — ONDANSETRON HCL 4 MG/2ML IJ SOLN
INTRAMUSCULAR | Status: DC | PRN
  Administered 2020-03-21: 4 mg via INTRAVENOUS

## 2020-03-21 MED ORDER — SODIUM CHLORIDE 0.9 % IV SOLN
INTRAVENOUS | Status: AC
  Filled 2020-03-21 (×2): qty 1.2

## 2020-03-21 MED ORDER — TRANEXAMIC ACID-NACL 1000-0.7 MG/100ML-% IV SOLN
INTRAVENOUS | Status: DC | PRN
  Administered 2020-03-21: 1000 mg via INTRAVENOUS

## 2020-03-21 MED ORDER — BUPIVACAINE LIPOSOME 1.3 % IJ SUSP
INTRAMUSCULAR | Status: DC | PRN
  Administered 2020-03-21: 10 mL

## 2020-03-21 MED ORDER — KETAMINE HCL 50 MG/5ML IJ SOSY
PREFILLED_SYRINGE | INTRAMUSCULAR | Status: AC
  Filled 2020-03-21: qty 5

## 2020-03-21 SURGICAL SUPPLY — 87 items
APPLIER CLIP 11 MED OPEN (CLIP) ×6
BLADE CLIPPER SURG (BLADE) ×3 IMPLANT
BLADE SURG 10 STRL SS (BLADE) ×3 IMPLANT
BONE MATRIX OSTEOCEL PRO MED (Bone Implant) IMPLANT
CABLE BIPOLOR RESECTION CORD (MISCELLANEOUS) ×3 IMPLANT
CLIP APPLIE 11 MED OPEN (CLIP) ×4 IMPLANT
CLIP LIGATING EXTRA MED SLVR (CLIP) ×3 IMPLANT
CLIP LIGATING EXTRA SM BLUE (MISCELLANEOUS) IMPLANT
CLSR STERI-STRIP ANTIMIC 1/2X4 (GAUZE/BANDAGES/DRESSINGS) ×6 IMPLANT
COVER SURGICAL LIGHT HANDLE (MISCELLANEOUS) ×3 IMPLANT
COVER WAND RF STERILE (DRAPES) ×6 IMPLANT
DERMABOND ADVANCED (GAUZE/BANDAGES/DRESSINGS) ×1
DERMABOND ADVANCED .7 DNX12 (GAUZE/BANDAGES/DRESSINGS) ×2 IMPLANT
DRAPE C-ARM 42X72 X-RAY (DRAPES) ×3 IMPLANT
DRAPE C-ARMOR (DRAPES) ×3 IMPLANT
DRAPE POUCH INSTRU U-SHP 10X18 (DRAPES) ×3 IMPLANT
DRAPE SURG 17X23 STRL (DRAPES) ×3 IMPLANT
DRAPE U-SHAPE 47X51 STRL (DRAPES) ×3 IMPLANT
DRSG OPSITE POSTOP 4X8 (GAUZE/BANDAGES/DRESSINGS) ×3 IMPLANT
DURAPREP 26ML APPLICATOR (WOUND CARE) ×6 IMPLANT
ELECT BLADE 4.0 EZ CLEAN MEGAD (MISCELLANEOUS) ×6
ELECT CAUTERY BLADE 6.4 (BLADE) ×3 IMPLANT
ELECT PENCIL ROCKER SW 15FT (MISCELLANEOUS) ×3 IMPLANT
ELECT REM PT RETURN 9FT ADLT (ELECTROSURGICAL) ×3
ELECTRODE BLDE 4.0 EZ CLN MEGD (MISCELLANEOUS) ×4 IMPLANT
ELECTRODE REM PT RTRN 9FT ADLT (ELECTROSURGICAL) ×2 IMPLANT
GAUZE 4X4 16PLY RFD (DISPOSABLE) IMPLANT
GLOVE BIO SURGEON STRL SZ 6.5 (GLOVE) ×3 IMPLANT
GLOVE BIO SURGEON STRL SZ7.5 (GLOVE) IMPLANT
GLOVE BIOGEL PI IND STRL 6.5 (GLOVE) ×2 IMPLANT
GLOVE BIOGEL PI IND STRL 8.5 (GLOVE) ×4 IMPLANT
GLOVE BIOGEL PI INDICATOR 6.5 (GLOVE) ×1
GLOVE BIOGEL PI INDICATOR 8.5 (GLOVE) ×2
GLOVE SS BIOGEL STRL SZ 7.5 (GLOVE) ×2 IMPLANT
GLOVE SS BIOGEL STRL SZ 8.5 (GLOVE) ×4 IMPLANT
GLOVE SUPERSENSE BIOGEL SZ 7.5 (GLOVE) ×1
GLOVE SUPERSENSE BIOGEL SZ 8.5 (GLOVE) ×2
GOWN STRL REUS W/ TWL LRG LVL3 (GOWN DISPOSABLE) ×8 IMPLANT
GOWN STRL REUS W/TWL 2XL LVL3 (GOWN DISPOSABLE) ×6 IMPLANT
GOWN STRL REUS W/TWL LRG LVL3 (GOWN DISPOSABLE) ×12
HEMOSTAT SNOW SURGICEL 2X4 (HEMOSTASIS) IMPLANT
INSERT FOGARTY 61MM (MISCELLANEOUS) IMPLANT
INSERT FOGARTY SM (MISCELLANEOUS) IMPLANT
KIT BASIN OR (CUSTOM PROCEDURE TRAY) ×3 IMPLANT
KIT TURNOVER KIT B (KITS) ×3 IMPLANT
LOOP VESSEL MAXI BLUE (MISCELLANEOUS) ×3 IMPLANT
LOOP VESSEL MINI RED (MISCELLANEOUS) IMPLANT
MARKER SKIN DUAL TIP RULER LAB (MISCELLANEOUS) ×3 IMPLANT
NEEDLE SPNL 18GX3.5 QUINCKE PK (NEEDLE) ×3 IMPLANT
NS IRRIG 1000ML POUR BTL (IV SOLUTION) ×6 IMPLANT
PACK LAMINECTOMY ORTHO (CUSTOM PROCEDURE TRAY) ×3 IMPLANT
PACK UNIVERSAL I (CUSTOM PROCEDURE TRAY) ×3 IMPLANT
PAD ARMBOARD 7.5X6 YLW CONV (MISCELLANEOUS) ×12 IMPLANT
SPONGE INTESTINAL PEANUT (DISPOSABLE) ×12 IMPLANT
SPONGE LAP 18X18 RF (DISPOSABLE) ×3 IMPLANT
SPONGE LAP 4X18 RFD (DISPOSABLE) IMPLANT
SPONGE SURGIFOAM ABS GEL 100 (HEMOSTASIS) IMPLANT
STAPLER VISISTAT 35W (STAPLE) IMPLANT
SURGIFLO W/THROMBIN 8M KIT (HEMOSTASIS) ×3 IMPLANT
SUT BONE WAX W31G (SUTURE) ×3 IMPLANT
SUT MNCRL AB 3-0 PS2 27 (SUTURE) ×6 IMPLANT
SUT PDS AB 1 CTX 36 (SUTURE) ×3 IMPLANT
SUT PDS AB 1 TP1 54 (SUTURE) ×6 IMPLANT
SUT PROLENE 4 0 PS 2 18 (SUTURE) ×3 IMPLANT
SUT PROLENE 4 0 RB 1 (SUTURE)
SUT PROLENE 4 0 SH DA (SUTURE) ×3 IMPLANT
SUT PROLENE 4-0 RB1 .5 CRCL 36 (SUTURE) IMPLANT
SUT PROLENE 5 0 C 1 24 (SUTURE) IMPLANT
SUT PROLENE 5 0 CC1 (SUTURE) ×15 IMPLANT
SUT PROLENE 6 0 C 1 30 (SUTURE) ×3 IMPLANT
SUT PROLENE 6 0 CC (SUTURE) IMPLANT
SUT SILK 0 TIES 10X30 (SUTURE) ×3 IMPLANT
SUT SILK 2 0 TIES 10X30 (SUTURE) ×6 IMPLANT
SUT SILK 2 0SH CR/8 30 (SUTURE) IMPLANT
SUT SILK 3 0 TIES 10X30 (SUTURE) ×6 IMPLANT
SUT SILK 3 0SH CR/8 30 (SUTURE) IMPLANT
SUT VIC AB 1 CT1 27 (SUTURE) ×6
SUT VIC AB 1 CT1 27XBRD ANBCTR (SUTURE) ×4 IMPLANT
SUT VIC AB 2-0 CT1 18 (SUTURE) ×9 IMPLANT
SYR 20ML LL LF (SYRINGE) ×6 IMPLANT
SYR BULB IRRIG 60ML STRL (SYRINGE) ×3 IMPLANT
TOWEL GREEN STERILE (TOWEL DISPOSABLE) ×6 IMPLANT
TOWEL GREEN STERILE FF (TOWEL DISPOSABLE) ×3 IMPLANT
TRAY FOLEY W/BAG SLVR 16FR (SET/KITS/TRAYS/PACK) ×3
TRAY FOLEY W/BAG SLVR 16FR ST (SET/KITS/TRAYS/PACK) ×2 IMPLANT
WATER STERILE IRR 1000ML POUR (IV SOLUTION) ×3 IMPLANT
YANKAUER SUCT BULB TIP NO VENT (SUCTIONS) ×3 IMPLANT

## 2020-03-21 NOTE — Transfer of Care (Signed)
Immediate Anesthesia Transfer of Care Note  Patient: Jason Moore  Procedure(s) Performed: ATTEMPTED ANTERIOR LUMBAR FUSION (ALIF) LUMBAR FIVE- SACRAL ONE, POSTERIOR SPINAL FUSION INTERBODY LUMBAR FIVE-SACRAL ONE (N/A Spine Lumbar) ABDOMINAL EXPOSURE (N/A Abdomen)  Patient Location: PACU  Anesthesia Type:General  Level of Consciousness: awake, alert , patient cooperative and responds to stimulation  Airway & Oxygen Therapy: Patient Spontanous Breathing and Patient connected to face mask oxygen  Post-op Assessment: Report given to RN, Post -op Vital signs reviewed and stable and Patient moving all extremities X 4  Post vital signs: Reviewed and stable  Last Vitals:  Vitals Value Taken Time  BP 114/98 03/21/20 1304  Temp    Pulse 93 03/21/20 1310  Resp 15 03/21/20 1310  SpO2 98 % 03/21/20 1310  Vitals shown include unvalidated device data.  Last Pain:  Vitals:   03/21/20 0742  TempSrc:   PainSc: 3          Complications: No complications documented.

## 2020-03-21 NOTE — Discharge Instructions (Signed)

## 2020-03-21 NOTE — Addendum Note (Signed)
Addendum  created 03/21/20 1611 by Mellody Dance, MD   Clinical Note Signed

## 2020-03-21 NOTE — Anesthesia Postprocedure Evaluation (Addendum)
Anesthesia Post Note  Patient: CORDERA STINEMAN  Procedure(s) Performed: ATTEMPTED ANTERIOR LUMBAR FUSION (ALIF) LUMBAR FIVE- SACRAL ONE, POSTERIOR SPINAL FUSION INTERBODY LUMBAR FIVE-SACRAL ONE (N/A Spine Lumbar) ABDOMINAL EXPOSURE (N/A Abdomen)     Patient location during evaluation: PACU Anesthesia Type: Regional Level of consciousness: awake Pain management: pain level controlled Vital Signs Assessment: post-procedure vital signs reviewed and stable Respiratory status: spontaneous breathing and respiratory function stable Cardiovascular status: stable Postop Assessment: no apparent nausea or vomiting Anesthetic complications: no Comments: Patient is stable and alert/oriented.  Will d/c arterial line and patient will go to the floor. No stepdown bed available. Discussed with surgeon. Tanna Furry, MD     No complications documented.  Last Vitals:  Vitals:   03/21/20 1415 03/21/20 1500  BP: 98/60   Pulse: 77 82  Resp: 13 18  Temp:    SpO2: 97% 98%    Last Pain:  Vitals:   03/21/20 1500  TempSrc:   PainSc: Asleep                 Mellody Dance

## 2020-03-21 NOTE — Op Note (Signed)
    OPERATIVE REPORT  DATE OF SURGERY: 03/21/2020  PATIENT: Jason Moore, 49 y.o. male MRN: [id number]  DOB: [birth year]  PRE-OPERATIVE DIAGNOSIS: Degenerative disc disease  POST-OPERATIVE DIAGNOSIS:  Same  PROCEDURE: #1 anterior exposure for L5-S1, #2 repair of injury to iliac vessels at the caval bifurcation  SURGEON:  Gretta Began, M.D.  Co-surgeon Dr. Venita Lick  Assistant Marchelle Folks Ward, PA-C  The assistant was needed for exposure and to expedite the case  ANESTHESIA: General  EBL: per anesthesia record  Total I/O In: 4655 [I.V.:1900; Blood:1885; IV Piggyback:870] Out: 3500 [Urine:300; Blood:3200]  BLOOD ADMINISTERED: none  DRAINS: none  SPECIMEN: none  COUNTS CORRECT:  YES  PATIENT DISPOSITION:  PACU - hemodynamically stable  PROCEDURE DETAILS: Patient was taken to room placed supine position where the area of the abdomen was prepped and draped in usual sterile fashion.  C-arm was used to determine the level of the L5-S1 disc.  Incision was made from the midline to the left laterally and carried down through the subcutaneous fat.  The anterior rectus sheath was opened in line with the skin incision and the rectus muscle was mobilized to the right.  Retroperitoneal space was entered and blunt dissection was continued down to the level of the L5-S1 disc.  Patient had extensive osteophytes over this and had a significant slip defect.  Blunt dissection was continued to the right and left of the L5-S1 disc.  The 160 mm blade was positioned to the right of the L5-S1 disc.  Continue to mobilize elation on the left of the disc was accomplished.  There was osteophyte present and some scarring.  The 160 blade was positioned to the left of the disc.  Malleable retractors were used for superior and inferior exposure.  On further development of the exposure there was bleeding from the crotch at the junction of the right and left common iliac arteries.  On releasing the retractor it  was apparent that there was a tear at this junction.  This was away from the middle sacral vessels which had been clipped and retracted.  This was difficult to control at the location and this was controlled with pressure.  I used 5-0 Prolene sutures to sew this defect and did require several sutures for control of this longitudinal tear.  Patient initially became hypotensive but responded to volume and Cell Saver blood and bank blood.  This controlled bleeding but on attempt to mobilize for placement of the retractors was not possible to do this without tearing the vein that had been repaired.  Dr. Shon Baton and I discussed this and felt that it would not be safe to proceed with anterior fusion.  The wound was irrigated and hemostasis was given trolled.  The retractors were removed.  The anterior fascia was closed with a PDS suture beginning medially and laterally and tying in the middle.  The subcutaneous tissue and skin were closed with Vicryl sutures.  Patient was extubated in the operating room and transferred to the recovery room in stable condition   Larina Earthly, M.D., [facility] 03/21/2020 6:17 PM

## 2020-03-21 NOTE — Anesthesia Procedure Notes (Signed)
Procedure Name: Intubation Date/Time: 03/21/2020 8:38 AM Performed by: Glynda Jaeger, CRNA Pre-anesthesia Checklist: Patient identified, Patient being monitored, Timeout performed, Emergency Drugs available and Suction available Patient Re-evaluated:Patient Re-evaluated prior to induction Oxygen Delivery Method: Circle System Utilized Preoxygenation: Pre-oxygenation with 100% oxygen Induction Type: IV induction Ventilation: Mask ventilation without difficulty Laryngoscope Size: Mac and 4 Grade View: Grade II Tube type: Oral Tube size: 7.5 mm Number of attempts: 1 Airway Equipment and Method: Stylet Placement Confirmation: ETT inserted through vocal cords under direct vision,  positive ETCO2 and breath sounds checked- equal and bilateral Secured at: 22 cm Tube secured with: Tape Dental Injury: Teeth and Oropharynx as per pre-operative assessment

## 2020-03-21 NOTE — Brief Op Note (Signed)
03/21/2020  12:49 PM  PATIENT:  Jason Moore  49 y.o. male  PRE-OPERATIVE DIAGNOSIS:  Grade 2 spondylothesis L5-S1  POST-OPERATIVE DIAGNOSIS:  Grade 2 spondylothesis L5-S1  PROCEDURE:  Procedure(s) with comments: ATTEMPTED ANTERIOR LUMBAR FUSION (ALIF) LUMBAR FIVE- SACRAL ONE, POSTERIOR SPINAL FUSION INTERBODY LUMBAR FIVE-SACRAL ONE (N/A) - 5 HRS Dr. Arbie Cookey to do approach Tap block with exparel ABDOMINAL EXPOSURE (N/A)  SURGEON:  Surgeon(s) and Role: Panel 1:    Venita Lick, MD - Primary Panel 2:    * Early, Kristen Loader, MD - Primary  PHYSICIAN ASSISTANT:  Amanda Ward, PA   ANESTHESIA:   general  EBL:  4L   BLOOD ADMINISTERED: 1 unit PRBC, 1570cc Cell saver  DRAINS: none   LOCAL MEDICATIONS USED:  NONE  SPECIMEN:  No Specimen  DISPOSITION OF SPECIMEN:  N/A  COUNTS:  YES  TOURNIQUET:  * No tourniquets in log *  DICTATION: .Dragon Dictation  PLAN OF CARE: Admit to inpatient   PATIENT DISPOSITION:  PACU - hemodynamically stable.

## 2020-03-21 NOTE — Op Note (Signed)
Operative report  Preoperative diagnosis: Degenerative grade 2 spondylolisthesis at L5-S1 with discogenic back and radicular leg pain  Postoperative diagnosis: Same  Operative procedure: Attempted anterior lumbar interbody fusion L5-S1, supplemental posterior L5-S1 fusion  Complications: Injury to the iliac vessel necessitating direct repair.  Significant blood loss.  As a result we elected to abort the procedure.  Vascular surgeon: Dr. Gretta Began  First Assistant: Marchelle Folks Ward  EBL:  4L Cell saver: 1570 Transfused: 1unit PRBC  Operative report: The patient was brought the operating room placed upon the operating room table.  After successful induction of general anesthesia and endotracheal ovation teds, SCDs, and a Foley were inserted.  The anterior abdomen was prepped and draped in a standard fashion.  Timeout was taken to confirm patient, procedure, and all other important data.  At this point time Dr. Arbie Cookey, and Marchelle Folks Ward, PA scrubbed in to perform a standard anterior retroperitoneal approach to the lumbar spine.  Please refer to his dictation for details.  As a result of significant bleeding I did scrub in temporarily to assist Dr. Arbie Cookey.  Ultimately he was able to visualize and repair the iliac vein injury.  At this point time due to the excessive blood loss we both elected to abort the procedure.  Although the patient was hemodynamically stable at this point, I do not feel as though moving forward with the fusion was in his best interest.  Any attempt at retracting the iliac vein resulted in bleeding; prevented adequate exposure of the disc space.  At this point the wound was copiously irrigated with normal saline and closed in a layered fashion.  Myself and Dr. Arbie Cookey spoke with his wife and explained the complication and situation and the reason for aborting the spinal fusion.  All of her questions were addressed.  Due to the excessive blood loss I am going to admit the patient to the  stepdown unit for overnight observation.  If his hematocrit tomorrow is satisfactory then we will plan on discharging him to home.

## 2020-03-21 NOTE — OR Nursing (Signed)
Per protocol Dr. Gaylyn Rong called at 12:25 on 03/21/2020 and confirmed there were no findings of retained sponge or other unexpected foreign body.

## 2020-03-21 NOTE — Anesthesia Procedure Notes (Signed)
Anesthesia Regional Block: TAP block   Pre-Anesthetic Checklist: ,, timeout performed, Correct Patient, Correct Site, Correct Laterality, Correct Procedure, Correct Position, site marked, Risks and benefits discussed,  Surgical consent,  Pre-op evaluation,  At surgeon's request and post-op pain management  Laterality: Left  Prep: chloraprep       Needles:  Injection technique: Single-shot  Needle Type: Echogenic Stimulator Needle     Needle Length: 10cm  Needle Gauge: 20     Additional Needles:   Procedures:,,,, ultrasound used (permanent image in chart),,,,  Narrative:  Start time: 03/21/2020 8:10 AM End time: 03/21/2020 8:15 AM Injection made incrementally with aspirations every 5 mL.  Performed by: Personally  Anesthesiologist: Mellody Dance, MD  Additional Notes: A functioning IV was confirmed and monitors were applied.  Sterile prep and drape, hand hygiene and sterile gloves were used.  Negative aspiration and test dose prior to incremental administration of local anesthetic. The patient tolerated the procedure well.Ultrasound  guidance: relevant anatomy identified, needle position confirmed, local anesthetic spread visualized around nerve(s), vascular puncture avoided.  Image printed for medical record.

## 2020-03-21 NOTE — H&P (Signed)
Addendum H&P  Patient presents today for definitive surgical management for grade 2 degenerative spondylolisthesis at L5-S1 with significant back buttock and radicular leg pain.  Clinical exam is unchanged since his last office visit of 03/06/2020.  I have gone over the surgical procedure as well as the risks and benefits of surgery with him in great detail and all of his questions and concerns were addressed.  The patient has expressed a willingness to move forward with surgery.

## 2020-03-22 LAB — POCT I-STAT 7, (LYTES, BLD GAS, ICA,H+H)
Acid-base deficit: 3 mmol/L — ABNORMAL HIGH (ref 0.0–2.0)
Acid-base deficit: 6 mmol/L — ABNORMAL HIGH (ref 0.0–2.0)
Bicarbonate: 21.7 mmol/L (ref 20.0–28.0)
Bicarbonate: 22.1 mmol/L (ref 20.0–28.0)
Calcium, Ion: 0.98 mmol/L — ABNORMAL LOW (ref 1.15–1.40)
Calcium, Ion: 1.03 mmol/L — ABNORMAL LOW (ref 1.15–1.40)
HCT: 28 % — ABNORMAL LOW (ref 39.0–52.0)
HCT: 30 % — ABNORMAL LOW (ref 39.0–52.0)
Hemoglobin: 10.2 g/dL — ABNORMAL LOW (ref 13.0–17.0)
Hemoglobin: 9.5 g/dL — ABNORMAL LOW (ref 13.0–17.0)
O2 Saturation: 100 %
O2 Saturation: 100 %
Potassium: 3.5 mmol/L (ref 3.5–5.1)
Potassium: 4.6 mmol/L (ref 3.5–5.1)
Sodium: 141 mmol/L (ref 135–145)
Sodium: 143 mmol/L (ref 135–145)
TCO2: 23 mmol/L (ref 22–32)
TCO2: 23 mmol/L (ref 22–32)
pCO2 arterial: 41.1 mmHg (ref 32.0–48.0)
pCO2 arterial: 49.5 mmHg — ABNORMAL HIGH (ref 32.0–48.0)
pH, Arterial: 7.249 — ABNORMAL LOW (ref 7.350–7.450)
pH, Arterial: 7.339 — ABNORMAL LOW (ref 7.350–7.450)
pO2, Arterial: 334 mmHg — ABNORMAL HIGH (ref 83.0–108.0)
pO2, Arterial: 372 mmHg — ABNORMAL HIGH (ref 83.0–108.0)

## 2020-03-22 LAB — CBC
HCT: 34.9 % — ABNORMAL LOW (ref 39.0–52.0)
Hemoglobin: 11.2 g/dL — ABNORMAL LOW (ref 13.0–17.0)
MCH: 29.5 pg (ref 26.0–34.0)
MCHC: 32.1 g/dL (ref 30.0–36.0)
MCV: 91.8 fL (ref 80.0–100.0)
Platelets: 141 10*3/uL — ABNORMAL LOW (ref 150–400)
RBC: 3.8 MIL/uL — ABNORMAL LOW (ref 4.22–5.81)
RDW: 16.1 % — ABNORMAL HIGH (ref 11.5–15.5)
WBC: 12.6 10*3/uL — ABNORMAL HIGH (ref 4.0–10.5)
nRBC: 0 % (ref 0.0–0.2)

## 2020-03-22 MED ORDER — MAGNESIUM CITRATE PO SOLN
1.0000 | Freq: Once | ORAL | Status: AC
  Administered 2020-03-22: 1 via ORAL
  Filled 2020-03-22: qty 296

## 2020-03-22 NOTE — Evaluation (Addendum)
Physical Therapy Evaluation and Discharge Patient Details Name: Jason Moore MRN: [id number] DOB: February 03, 1971 Today's Date: 03/22/2020   History of Present Illness  Pt is a 49 y/o male who presents s/p L5-S1 ALIF on 03/21/2020. PMH significant for HTN.  Clinical Impression  Patient evaluated by Physical Therapy with no further acute PT needs identified. All education has been completed and the patient has no further questions. Pt was able to demonstrate transfers and ambulation with gross modified independence to supervision for safety. Initially without AD and at end of gait training tried the [facility]. Pt reports some improvement in hip pain with cane use, and has bilateral walking sticks at home he can use if needed. Pt was educated on precautions, appropriate activity progression, and car transfer. Pt reports MD told him he does not have to wear the brace. See below for any follow-up Physical Therapy or equipment needs. PT is signing off. Thank you for this referral.     Follow Up Recommendations No PT follow up;Supervision for mobility/OOB    Equipment Recommendations  None recommended by PT    Recommendations for Other Services       Precautions / Restrictions Precautions Precautions: Fall;Back Precaution Booklet Issued: Yes (comment) Precaution Comments: Reviewed handout and pt was cued for precautions during functional mobility. Restrictions Weight Bearing Restrictions: No      Mobility  Bed Mobility Overal bed mobility: Modified Independent             General bed mobility comments: HOB flat and rails lowered to simulate home environment. Pt was able to complete without assist.    Transfers Overall transfer level: Modified independent Equipment used: None             General transfer comment: No assist required to power-up to full stand. No unsteadiness or LOB noted.  Ambulation/Gait Ambulation/Gait assistance: Supervision Gait Distance (Feet): 300  Feet Assistive device: None;Straight cane Gait Pattern/deviations: Step-through pattern;Decreased stride length;Trunk flexed Gait velocity: Decreased Gait velocity interpretation: <1.31 ft/sec, indicative of household ambulator General Gait Details: Slow and guarded due to pain in hips/groin (R worse than L), Initially without AD and pt taking standing rest breaks due to pain. With Jason Moore, pt reports improvement.  Stairs Stairs: Yes Stairs assistance: Min guard;Supervision Stair Management: One rail Right;Alternating pattern;Forwards Number of Stairs: 10 General stair comments: VC's for sequencing and general safety.  Wheelchair Mobility    Modified Rankin (Stroke Patients Only)       Balance Overall balance assessment: Mild deficits observed, not formally tested                                           Pertinent Vitals/Pain Pain Assessment: Faces Faces Pain Scale: Hurts even more Pain Location: Hips and groin bilaterally Pain Descriptors / Indicators: Operative site guarding;Aching;Tightness Pain Intervention(s): Limited activity within patient's tolerance;Monitored during session;Repositioned    Home Living Family/patient expects to be discharged to:: Private residence Living Arrangements: Spouse/significant other Available Help at Discharge: Family;Available 24 hours/day Type of Home: House Home Access: Stairs to enter Entrance Stairs-Rails: Right;Left;Can reach both Entrance Stairs-Number of Steps: 3-5 Home Layout: One level Home Equipment: Other (comment);Toilet riser;Shower seat (walking sticks)      Prior Function Level of Independence: Independent         Comments: 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  Hand Dominance        Extremity/Trunk Assessment   Upper Extremity Assessment Upper Extremity Assessment: Overall WFL for tasks assessed    Lower Extremity Assessment Lower Extremity Assessment:  Generalized weakness (Consistent with pre-op diagnosis)    Cervical / Trunk Assessment Cervical / Trunk Assessment: Other exceptions Cervical / Trunk Exceptions: s/p surgery  Communication   Communication: No difficulties  Cognition Arousal/Alertness: Awake/alert Behavior During Therapy: WFL for tasks assessed/performed Overall Cognitive Status: Within Functional Limits for tasks assessed                                        General Comments      Exercises     Assessment/Plan    PT Assessment Patent does not need any further PT services  PT Problem List         PT Treatment Interventions      PT Goals (Current goals can be found in the Care Plan section)  Acute Rehab PT Goals Patient Stated Goal: Home today PT Goal Formulation: All assessment and education complete, DC therapy    Frequency     Barriers to discharge        Co-evaluation               AM-PAC PT "6 Clicks" Mobility  Outcome Measure Help needed turning from your back to your side while in a flat bed without using bedrails?: None Help needed moving from lying on your back to sitting on the side of a flat bed without using bedrails?: None Help needed moving to and from a bed to a chair (including a wheelchair)?: None Help needed standing up from a chair using your arms (e.g., wheelchair or bedside chair)?: None Help needed to walk in hospital room?: None Help needed climbing 3-5 steps with a railing? : A Little 6 Click Score: 23    End of Session Equipment Utilized During Treatment: Gait belt Activity Tolerance: Patient tolerated treatment well Patient left: with family/visitor present (In bathroom to brush his teeth) Nurse Communication: Mobility status PT Visit Diagnosis: Pain;Difficulty in walking, not elsewhere classified (R26.2) Pain - part of body: Hip (back, abdomen)    Time: 1191-4782 PT Time Calculation (min) (ACUTE ONLY): 29 min   Charges:   PT  Evaluation $PT Eval Low Complexity: 1 Low PT Treatments $Gait Training: 8-22 mins        Conni Slipper, PT, DPT Acute Rehabilitation Services Pager: [phone] Office: [phone]   Marylynn Pearson 03/22/2020, 12:41 PM

## 2020-03-22 NOTE — Progress Notes (Signed)
Subjective: 1 Day Post-Op Procedure(s) (LRB): ATTEMPTED ANTERIOR LUMBAR FUSION (ALIF) LUMBAR FIVE- SACRAL ONE, POSTERIOR SPINAL FUSION INTERBODY LUMBAR FIVE-SACRAL ONE (N/A) ABDOMINAL EXPOSURE (N/A) Patient reports pain as mild.   Denies dizziness. +ambulation +void -Flatus, - BM Tolerating PO w/o N/V  Objective: Vital signs in last 24 hours: Temp:  [97.3 F (36.3 C)-99.4 F (37.4 C)] 99.4 F (37.4 C) (12/09 0730) Pulse Rate:  [77-108] 108 (12/09 0730) Resp:  [9-20] 18 (12/09 0730) BP: (94-130)/(58-98) 130/74 (12/09 0730) SpO2:  [95 %-100 %] 97 % (12/09 0730) Arterial Line BP: (90-117)/(39-63) 93/46 (12/08 1600)  Intake/Output from previous day: 12/08 0701 - 12/09 0700 In: 4995 [P.O.:240; I.V.:1900; Blood:1885; IV Piggyback:970] Out: 3500 [Urine:300; Blood:3200] Intake/Output this shift: No intake/output data recorded.  Recent Labs    03/19/20 1355 03/21/20 1935 03/22/20 0451  HGB 16.0 12.9* 11.2*   Recent Labs    03/19/20 1355 03/21/20 1935 03/22/20 0451  WBC 4.6  --  12.6*  RBC 5.12  --  3.80*  HCT 48.6 40.5 34.9*  PLT 246  --  141*   Recent Labs    03/19/20 1355  NA 139  K 3.8  CL 105  CO2 23  BUN 6  CREATININE 1.21  GLUCOSE 104*  CALCIUM 9.4   Recent Labs    03/19/20 1355  INR 1.1    Neurologically intact ABD soft Neurovascular intact Sensation intact distally Intact pulses distally Dorsiflexion/Plantar flexion intact Incision: dressing C/D/I No cellulitis present Compartment soft   Assessment/Plan: 1 Day Post-Op Procedure(s) (LRB): ATTEMPTED ANTERIOR LUMBAR FUSION (ALIF) LUMBAR FIVE- SACRAL ONE, POSTERIOR SPINAL FUSION INTERBODY LUMBAR FIVE-SACRAL ONE (N/A) ABDOMINAL EXPOSURE (N/A) Advance diet Up with therapy  DVT PPx: Teds, SCDs, ambulation Encourage IS     Jason Moore 03/22/2020, 8:06 AM

## 2020-03-22 NOTE — Progress Notes (Signed)
[facility]   Subjective  - Alert and oriented.  CC soreness at incision site.   Objective 130/74 (!) 108 99.4 F (37.4 C) (Oral) 18 97%  Intake/Output Summary (Last 24 hours) at 03/22/2020 0742 Last data filed at 03/22/2020 0424 Gross per 24 hour  Intake 4995 ml  Output 3500 ml  Net 1495 ml    Moving extremities, no focal weakness DP pulses palpable Abdomin soft, dressing clean and dry   Assessment/Planning: POD # #1 anterior exposure for L5-S1, #2 repair of injury to iliac vessels at the caval bifurcation  Iliac vein repaired Abdominal incision healing well with clean dry dressing Stable disposition from a vascular point of view  Mosetta Pigeon 03/22/2020 7:42 AM --  Laboratory Lab Results: Recent Labs    03/19/20 1355 03/21/20 1935 03/22/20 0451  WBC 4.6  --  12.6*  HGB 16.0 12.9* 11.2*  HCT 48.6 40.5 34.9*  PLT 246  --  141*   BMET Recent Labs    03/19/20 1355  NA 139  K 3.8  CL 105  CO2 23  GLUCOSE 104*  BUN 6  CREATININE 1.21  CALCIUM 9.4    COAG Lab Results  Component Value Date   INR 1.1 03/19/2020   No results found for: PTT

## 2020-03-22 NOTE — Progress Notes (Signed)
[facility] [place]  Subjective  -no complaints other than just states his abdominanl incision is a bit sore.   Objective 130/74 (!) 108 99.4 F (37.4 C) (Oral) 18 97%  Intake/Output Summary (Last 24 hours) at 03/22/2020 0741 Last data filed at 03/22/2020 0424 Gross per 24 hour  Intake 4995 ml  Output 3500 ml  Net 1495 ml    Left transverse abdominal incision c/d/i Left DP pulse palpable Appropriate post-op incisional tenderness  Laboratory Lab Results: Recent Labs    03/19/20 1355 03/21/20 1935 03/22/20 0451  WBC 4.6  --  12.6*  HGB 16.0 12.9* 11.2*  HCT 48.6 40.5 34.9*  PLT 246  --  141*   BMET Recent Labs    03/19/20 1355  NA 139  K 3.8  CL 105  CO2 23  GLUCOSE 104*  BUN 6  CREATININE 1.21  CALCIUM 9.4    COAG Lab Results  Component Value Date   INR 1.1 03/19/2020   No results found for: PTT  Assessment/Planning:  Postop day 1 status post anterior spine exposure at L5-S1 by Dr. Arbie Cookey.  Ultimately there was a iliac vein injury that had to be repaired at the caval bifurcation and spine surgery was not performed.  Hemoglobin this morning 11.2 and hemodynamically stable.  Appropriate postop incisional tenderness.  Palpable pulse in the left foot.  Vascular will follow but looks good this am.  Cephus Shelling 03/22/2020 7:41 AM --

## 2020-03-23 MED ORDER — FLEET ENEMA 7-19 GM/118ML RE ENEM
1.0000 | ENEMA | Freq: Once | RECTAL | Status: AC
  Administered 2020-03-23: 1 via RECTAL
  Filled 2020-03-23: qty 1

## 2020-03-23 NOTE — Progress Notes (Signed)
    Subjective: Procedure(s) (LRB): ATTEMPTED ANTERIOR LUMBAR FUSION (ALIF) LUMBAR FIVE- SACRAL ONE, POSTERIOR SPINAL FUSION INTERBODY LUMBAR FIVE-SACRAL ONE (N/A) ABDOMINAL EXPOSURE (N/A) 2 Days Post-Op  Patient reports pain as 1 on 0-10 scale.  Reports unchanged leg pain reports incisional back pain   Positive void Negative bowel movement Negative flatus Positive chest pain or shortness of breath  Objective: Vital signs in last 24 hours: Temp:  [98.2 F (36.8 C)-99.6 F (37.6 C)] 99.1 F (37.3 C) (12/10 0720) Pulse Rate:  [95-115] 108 (12/10 0720) Resp:  [18-20] 18 (12/10 0720) BP: (105-137)/(45-83) 137/83 (12/10 0720) SpO2:  [94 %-99 %] 97 % (12/10 0720)  Intake/Output from previous day: No intake/output data recorded.  Labs: Recent Labs    03/21/20 1935 03/22/20 0451  WBC  --  12.6*  RBC  --  3.80*  HCT 40.5 34.9*  PLT  --  141*   Recent Labs    03/21/20 1131 03/21/20 1223  NA 141 143  K 4.6 3.5   No results for input(s): LABPT, INR in the last 72 hours.  Physical Exam: Neurologically intact ABD soft Intact pulses distally Incision: dressing C/D/I Compartment soft Body mass index is 34.55 kg/m.   Assessment/Plan: Patient stable  xrays n/a Continue mobilization with physical therapy Continue care  Patient remained stable.  Intact peripheral pulses.  Compartments are soft and nontender.  Vital signs are stable there is no signs of hemodynamic instability.  Abdomen remains soft and nontender no clinical findings to suggest ongoing bleeding.  At this point time I am hesitant to discharge him until he has some bowel function.  He has had no flatus or bowel movement.  He is ambulating and tolerating a regular diet.  We did provide MiraLAX yesterday and we just did an enema today.  If he has the appropriate response then he can be discharged home.  He will follow-up with me in 2 weeks.  Venita Lick, MD [facility] [phone]

## 2020-03-23 NOTE — Progress Notes (Signed)
Patient is discharged from room 3C04 at this time. Alert and in stable condition. Patient had a large bowel movement. IV site d/c'd and instructions read to patient and spouse with understanding verbalized and all questions answered. Left unit via wheelchair with all belongings at side.

## 2020-03-23 NOTE — Progress Notes (Signed)
  Progress Note    03/23/2020 7:43 AM 2 Days Post-Op  Subjective:  Pt in restroom.  Wife states he has not been passing gas but feels it is starting to move.  She says he has been up in the hallways walking.  Has some soreness.    Vitals:   03/23/20 0412 03/23/20 0720  BP: 128/61 137/83  Pulse: (!) 104 (!) 108  Resp: 20 18  Temp: 99.1 F (37.3 C) 99.1 F (37.3 C)  SpO2: 96% 97%     CBC    Component Value Date/Time   WBC 12.6 (H) 03/22/2020 0451   RBC 3.80 (L) 03/22/2020 0451   HGB 11.2 (L) 03/22/2020 0451   HCT 34.9 (L) 03/22/2020 0451   PLT 141 (L) 03/22/2020 0451   MCV 91.8 03/22/2020 0451   MCH 29.5 03/22/2020 0451   MCHC 32.1 03/22/2020 0451   RDW 16.1 (H) 03/22/2020 0451    BMET    Component Value Date/Time   NA 143 03/21/2020 1223   K 3.5 03/21/2020 1223   CL 105 03/19/2020 1355   CO2 23 03/19/2020 1355   GLUCOSE 104 (H) 03/19/2020 1355   BUN 6 03/19/2020 1355   CREATININE 1.21 03/19/2020 1355   CALCIUM 9.4 03/19/2020 1355   GFRNONAA >60 03/19/2020 1355    INR    Component Value Date/Time   INR 1.1 03/19/2020 1355     Intake/Output Summary (Last 24 hours) at 03/23/2020 0743 Last data filed at 03/22/2020 1521 Gross per 24 hour  Intake 0 ml  Output --  Net 0 ml     Assessment:  49 y.o. male is s/p:  #1 anterior exposure for L5-S1 #2 repair of injury to iliac vessels at the caval bifurcation  2 Days Post-Op  Plan: -pt in restroom at time of visit -if pt unable to pass gas, may benefit from dulcolax supp. -follow up per ortho.    Doreatha Massed, PA-C [facility] [phone] 03/23/2020 7:43 AM

## 2020-03-25 LAB — BPAM RBC
Blood Product Expiration Date: 202112292359
Blood Product Expiration Date: 202201012359
Blood Product Expiration Date: 202201072359
Blood Product Expiration Date: 202201092359
Blood Product Expiration Date: 202201102359
Blood Product Expiration Date: 202201102359
ISSUE DATE / TIME: 202112081048
ISSUE DATE / TIME: 202112081048
ISSUE DATE / TIME: 202112081144
ISSUE DATE / TIME: 202112081144
Unit Type and Rh: 1700
Unit Type and Rh: 1700
Unit Type and Rh: 1700
Unit Type and Rh: 1700
Unit Type and Rh: 1700
Unit Type and Rh: 1700

## 2020-03-25 LAB — TYPE AND SCREEN
ABO/RH(D): B NEG
Antibody Screen: NEGATIVE
Unit division: 0
Unit division: 0
Unit division: 0
Unit division: 0
Unit division: 0
Unit division: 0

## 2020-03-26 MED FILL — Sodium Chloride IV Soln 0.9%: INTRAVENOUS | Qty: 4000 | Status: AC

## 2020-03-26 MED FILL — Heparin Sodium (Porcine) Inj 1000 Unit/ML: INTRAMUSCULAR | Qty: 60 | Status: AC

## 2020-03-26 NOTE — Discharge Summary (Signed)
Patient ID: Jason Moore MRN: [id number] DOB/AGE: 1970/05/11 49 y.o.  Admit date: 03/21/2020 Discharge date: 03/26/2020  Admission Diagnoses:  Active Problems:   Degenerative disc disease at L5-S1 level   Discharge Diagnoses:  Active Problems:   Degenerative disc disease at L5-S1 level  status post Procedure(s): ATTEMPTED ANTERIOR LUMBAR FUSION (ALIF) LUMBAR FIVE- SACRAL ONE, POSTERIOR SPINAL FUSION INTERBODY LUMBAR FIVE-SACRAL ONE ABDOMINAL EXPOSURE  Past Medical History:  Diagnosis Date  . Back pain   . Degeneration of lumbar intervertebral disc   . Hypertension   . Isthmic spondylolisthesis   . Joint pain   . Lumbar radiculopathy   . Numbness and tingling   . Sleep apnea    cpap @ [facility]    Surgeries: Procedure(s): ATTEMPTED ANTERIOR LUMBAR FUSION (ALIF) LUMBAR FIVE- SACRAL ONE, POSTERIOR SPINAL FUSION INTERBODY LUMBAR FIVE-SACRAL ONE ABDOMINAL EXPOSURE on 03/21/2020   Consultants: Treatment Team:  Larina Earthly, MD  Discharged Condition: Improved  Hospital Course: Jason Moore is an 49 y.o. male who was admitted 03/21/2020 for operative treatment of Grade 2 spondylothesis L5-S1. Patient failed conservative treatments (please see the history and physical for the specifics) and had severe unremitting pain that affects sleep, daily activities and work/hobbies. After pre-op clearance, the patient was taken to the operating room on 03/21/2020 and underwent  Procedure(s): ATTEMPTED ANTERIOR LUMBAR FUSION (ALIF) LUMBAR FIVE- SACRAL ONE, POSTERIOR SPINAL FUSION INTERBODY LUMBAR FIVE-SACRAL ONE ABDOMINAL EXPOSURE.    Patient was given perioperative antibiotics:  Anti-infectives (From admission, onward)   Start     Dose/Rate Route Frequency Ordered Stop   03/21/20 2000  ceFAZolin (ANCEF) IVPB 1 g/50 mL premix        1 g 100 mL/hr over 30 Minutes Intravenous Every 8 hours 03/21/20 1723 03/22/20 0952   03/21/20 0645  ceFAZolin (ANCEF) IVPB 2g/100 mL premix        2  g 200 mL/hr over 30 Minutes Intravenous 30 min pre-op 03/21/20 0645 03/21/20 1232       Patient was given sequential compression devices and early ambulation to prevent DVT.   Patient benefited maximally from hospital stay and there were no complications. At the time of discharge, the patient was urinating/moving their bowels without difficulty, tolerating a regular diet, pain is controlled with oral pain medications and they have been cleared by PT/OT.   Recent vital signs: No data found.   Recent laboratory studies: No results for input(s): WBC, HGB, HCT, PLT, NA, K, CL, CO2, BUN, CREATININE, GLUCOSE, INR, CALCIUM in the last 72 hours.  Invalid input(s): PT, 2   Discharge Medications:   Allergies as of 03/23/2020   No Known Allergies     Medication List    STOP taking these medications   acetaminophen 500 MG tablet Commonly known as: TYLENOL   tadalafil 5 MG tablet Commonly known as: CIALIS   traMADol 50 MG tablet Commonly known as: ULTRAM     TAKE these medications   ascorbic acid 500 MG tablet Commonly known as: VITAMIN C Take 500 mg by mouth daily.   aspirin EC 81 MG tablet Take 1 tablet (81 mg total) by mouth daily. Start on POD #2   Bystolic 5 MG tablet Generic drug: nebivolol Take 5 mg by mouth daily.   escitalopram 5 MG tablet Commonly known as: LEXAPRO Take 5 mg by mouth daily in the afternoon.   Fish Oil 1200 MG Caps Take 1,200 mg by mouth daily.   gabapentin 300 MG capsule Commonly known  as: Neurontin Take 1 capsule (300 mg total) by mouth 3 (three) times daily as needed for up to 14 days. What changed:   medication strength  how much to take  when to take this  reasons to take this   multivitamin with minerals Tabs tablet Take 1 tablet by mouth daily.   ondansetron 4 MG tablet Commonly known as: Zofran Take 1 tablet (4 mg total) by mouth every 8 (eight) hours as needed for nausea or vomiting.   oxyCODONE-acetaminophen 10-325 MG  tablet Commonly known as: Percocet Take 1 tablet by mouth every 6 (six) hours as needed for up to 5 days for pain.   Vitamin D-3 125 MCG (5000 UT) Tabs Take 5,000 Units by mouth daily.       Diagnostic Studies: DG Chest 2 View  Result Date: 03/19/2020 CLINICAL DATA:  Preop. EXAM: CHEST - 2 VIEW COMPARISON:  None. FINDINGS: The heart size and mediastinal contours are within normal limits. Both lungs are clear. The visualized skeletal structures are unremarkable. IMPRESSION: No active cardiopulmonary disease. Electronically Signed   By: Lupita Raider M.D.   On: 03/19/2020 14:10   DG C-Arm 1-60 Min-No Report  Result Date: 03/21/2020 Fluoroscopy was utilized by the requesting physician.  No radiographic interpretation.   DG OR LOCAL ABDOMEN  Result Date: 03/21/2020 CLINICAL DATA:  Assessment for retained foreign body, L5-S1 fusion. EXAM: OR LOCAL ABDOMEN COMPARISON:  Radiographs 02/08/2009 FINDINGS: Two small linear densities projecting over the left S2 sacral ala probably represent vascular clips based on morphology. No curved linear metal density characteristic of a needle is seen. No radiodensities characteristic of sponge noted. No specific worrisome foreign body noted. Scattered gas in the small bowel noted. Degenerative findings in the lower lumbar spine. IMPRESSION: 1. No compelling radiographic findings of medial, retained sponge, or other unexpected foreign body. 2. Two small linear densities projecting over the left S2 sacral ala probably represent vascular clips based on morphology. I called these results to the operating room and was informed that small clips in this vicinity were expected. Electronically Signed   By: Gaylyn Rong M.D.   On: 03/21/2020 12:28    Discharge Instructions    Incentive spirometry RT   Complete by: As directed        Follow-up Information    Venita Lick, MD. Schedule an appointment as soon as possible for a visit in 2 weeks.   Specialty:  Orthopedic Surgery Why: If symptoms worsen, For suture removal, For wound re-check Contact information: [address] [place] [address] [phone]               Discharge Plan:  discharge to home  Disposition:  stable    Signed: Leonette Monarch Raju Coppolino for [facility] PA-C [facility] [phone] 03/26/2020, 8:18 AM

## 2020-03-27 ENCOUNTER — Ambulatory Visit (HOSPITAL_COMMUNITY)
Admission: RE | Admit: 2020-03-27 | Discharge: 2020-03-27 | Disposition: A | Discharge status: Home / Self Care | Payer: BC Managed Care – PPO | Source: Ambulatory Visit | Attending: Orthopedic Surgery | Admitting: Orthopedic Surgery

## 2020-03-27 ENCOUNTER — Other Ambulatory Visit (HOSPITAL_COMMUNITY): Payer: Self-pay | Admitting: Orthopedic Surgery

## 2020-03-27 ENCOUNTER — Other Ambulatory Visit: Payer: Self-pay

## 2020-03-27 DIAGNOSIS — M7989 Other specified soft tissue disorders: Secondary | ICD-10-CM | POA: Diagnosis not present

## 2020-03-27 DIAGNOSIS — M79604 Pain in right leg: Secondary | ICD-10-CM | POA: Diagnosis not present

## 2020-03-27 DIAGNOSIS — M79605 Pain in left leg: Secondary | ICD-10-CM | POA: Diagnosis not present

## 2020-03-29 ENCOUNTER — Encounter (HOSPITAL_COMMUNITY): Payer: Self-pay | Admitting: Orthopedic Surgery

## 2020-04-09 ENCOUNTER — Ambulatory Visit: Payer: Self-pay | Admitting: Orthopedic Surgery

## 2020-04-20 ENCOUNTER — Ambulatory Visit: Payer: Self-pay | Admitting: Orthopedic Surgery

## 2020-04-20 NOTE — H&P (Deleted)
  The note originally documented on this encounter has been moved the the encounter in which it belongs.  

## 2020-04-20 NOTE — H&P (Signed)
Subjective:   PMH OSA, HTN 6 weeks s/p attempted and aborted ALIF (due to bleeding issues requiring transfusion), no post-op complications. CC: LBP and right leg pain > left leg pain; failed conservative treatment including OTC meds, rx meds (tramadol and gabapentin), injection therapy Scheduled for TLIF L5- S1 at [facility] with Dr. Rolena Infante on 05/02/2020  Patient Active Problem List   Diagnosis Date Noted  . Degenerative disc disease at L5-S1 level 03/21/2020  . Hypertension 02/24/2020  . BPH with obstruction/lower urinary tract symptoms 05/12/2018  . Erectile dysfunction due to arterial insufficiency 05/12/2018  . Family history of prostate cancer in father 05/12/2018  . Constipation 12/14/2013  . Incomplete emptying of bladder 12/14/2013  . Organic impotence 12/14/2013  . Thrombosed external hemorrhoid 12/14/2013   Past Medical History:  Diagnosis Date  . Back pain   . Degeneration of lumbar intervertebral disc   . Hypertension   . Isthmic spondylolisthesis   . Joint pain   . Lumbar radiculopathy   . Numbness and tingling   . Sleep apnea    cpap @ [facility]    Past Surgical History:  Procedure Laterality Date  . ABDOMINAL EXPOSURE N/A 03/21/2020   Procedure: ABDOMINAL EXPOSURE;  Surgeon: Rosetta Posner, MD;  [facility];  Service: Vascular;  Laterality: N/A;  . ANTERIOR LUMBAR FUSION N/A 03/21/2020   Procedure: ATTEMPTED ANTERIOR LUMBAR FUSION (ALIF) LUMBAR FIVE- SACRAL ONE, POSTERIOR SPINAL FUSION INTERBODY LUMBAR FIVE-SACRAL ONE;  Surgeon: Melina Schools, MD;  Location: [place];  Service: Orthopedics;  Laterality: N/A;  5 HRS Dr. Donnetta Hutching to do approach Tap block with exparel  . SHOULDER ARTHROSCOPY  04/15/2007    Current Outpatient Medications  Medication Sig Dispense Refill Last Dose  . ascorbic acid (VITAMIN C) 500 MG tablet Take 500 mg by mouth daily.     Marland Kitchen aspirin EC 81 MG tablet Take 1 tablet (81 mg total) by mouth daily. Start on POD #2 30 tablet 2   . BYSTOLIC 5 MG tablet  Take 5 mg by mouth daily.     . Cholecalciferol (VITAMIN D-3) 125 MCG (5000 UT) TABS Take 5,000 Units by mouth daily.     Marland Kitchen escitalopram (LEXAPRO) 5 MG tablet Take 5 mg by mouth daily in the afternoon.      . gabapentin (NEURONTIN) 300 MG capsule Take 1 capsule (300 mg total) by mouth 3 (three) times daily as needed for up to 14 days. 42 capsule 0   . Multiple Vitamin (MULTIVITAMIN WITH MINERALS) TABS tablet Take 1 tablet by mouth daily.     . Omega-3 Fatty Acids (FISH OIL) 1200 MG CAPS Take 1,200 mg by mouth daily.      . ondansetron (ZOFRAN) 4 MG tablet Take 1 tablet (4 mg total) by mouth every 8 (eight) hours as needed for nausea or vomiting. 20 tablet 0    No current facility-administered medications for this visit.   No Known Allergies  Social History   Tobacco Use  . Smoking status: Never Smoker  . Smokeless tobacco: Never Used  Substance Use Topics  . Alcohol use: Yes    Comment: Occasionally    No family history on file.  Review of Systems Pertinent items are noted in HPI.  Objective:   Vitals:  Ht: 5 ft 11 in 04/20/2020 01:20 pm BP: 120/78 R arm 04/20/2020 01:29 pm Pulse: 76 bpm 04/20/2020 01:30 pm  Clinical exam: Jason Moore is a pleasant individual, who appears younger than their stated age. He is alert and  orientated 3. No shortness of breath, chest pain.  Heart: Regular rate and rhythm, no rubs, murmurs, or gallops  Lungs: Clear to auscultation bilaterally  Abdomen is soft and non-tender, negative loss of bowel and bladder control, no rebound tenderness. Bowel sounds 4.  Negative: skin lesions abrasions contusions. Previous anterior abdomen incision well healed without any signs of infection. +early keloid formation.  Peripheral pulses: 2+ dorsalis pedis/posterior tibialis pulses bilaterally. Compartment soft and nontender.  Gait pattern: Altered gait pattern due to severe back pain.  Assistive devices: None  Neuro: Positive radicular right leg pain.  Positive straight leg raise test on the right side. Positive numbness and dysesthesias in the L5 dermatome on the right side. 5/5 motor strength bilaterally in the lower extremity. Negative Babinski test, no clonus. 1+ deep tendon reflexes symmetrical in the lower extremity.  Musculoskeletal: Severe back pain with extension or rotation of the lumbar spine. No SI joint pain with direct palpation.  X-rays of the lumbar spine taken 09/19/19 were reviewed. Patient has a grade 2 isthmic spondylolisthesis with advanced degenerative disc disease at L5-S1. No scoliosis. No compression fracture seen.  Lumbar MRI: completed on 09/28/19 was reviewed with the patient. It was completed at [facility]; I have independently reviewed the images as well as the radiology report. Isthmic spondylolisthesis L5-S1 with bilateral pars defect. Moderate to severe foraminal stenosis bilaterally. Moderate right foraminal disease at L4-5. Mild degenerative changes L1-4. No fracture or abnormal marrow signal changes noted.  Assessment:   Patient is a pleasant 50 year old male who is 6 weeks out from an attempted anterior lumbar interbody fusion, unfortunately the fusion procedure was aborted due to significant bleeding issues during the exposure. Fortunately, today the patient is doing well and his incision is well healed. He continues to have back pain and bilateral leg pain right side worse than left and he has expressed a desire to move forward with a transforaminal lumbar inner body fusion to help address his low back pain and radicular leg pain which unfortunately was not addressed during the attempted anterior lumbar interbody fusion due to the complications.  Plan:   Transforaminal lumbar interbodyy fusion L55-S1 at [facility] with Dr. Shon Baton on 05/02/2020  Risks and benefits of surgery were discussed with the patient. These include: Infection, bleeding, death, stroke, paralysis, ongoing or worse pain, need for  additional surgery, nonunion, leak of spinal fluid, adjacent segment degeneration requiring additional fusion surgery, Injury to abdominal vessels that can require anterior surgery to stop bleeding. Malposition of the cage and/or pedicle screws that could require additional surgery. Loss of bowel and bladder control. Postoperative hematoma causing neurologic compression that could require urgent or emergent re-operation.   We have also discussed the post-operative recovery period to include: bathing/showering restrictions, wound healing, activity (and driving) restrictions, medications/pain mangement. Patient was provided with a printout of his discharge instructions.  We have also discussed post-operative redflags to include: signs and symptoms of postoperative infection, DVT/PE.  We had previously obtained preoperative medical clearance which was completed within the last 3 months, no new cardio or pulmonary symptoms.  I reviewed the patient's medication list with him. Advised him to disc aspirin, over-the-counter NSAIDs, and multivitamin one week prior to surgery. He can restart his aspirin for 8 hours after surgery as well as his multivitamin. Advised him to avoid over-the-counter anti-inflammatory medications approximately 6 weeks postoperatively.  Patient already has LSO brace  Patient's wife attended appointment with him today. Both the patient and his wife's questions were invited  and answered  Follow-up: 2 weeks postop

## 2020-04-27 NOTE — Progress Notes (Signed)
[id number] [place], [place] - [address] [place] [address] Phone: [phone] Fax: [phone]      Your procedure is scheduled on 05/02/2020.  Report to [facility] Main Entrance "A" at 08:30 A.M., and check in at the Admitting office.  Call this number if you have problems the morning of surgery:  [phone]  Call [phone] if you have any questions prior to your surgery date Monday-Friday 8am-4pm    Remember:  Do not eat or drink after midnight the night before your surgery     Take these medicines the morning of surgery with A SIP OF WATER  BYSTOLIC gabapentin (NEURONTIN) - as needed  As of today, STOP taking any Aspirin (unless otherwise instructed by your surgeon) Aleve, Naproxen, Ibuprofen, Motrin, Advil, Goody's, BC's, all herbal medications, fish oil, and all vitamins.                      Do not wear jewelry, make up, or nail polish            Do not wear lotions, powders, perfumes/colognes, or deodorant.            Do not shave 48 hours prior to surgery.  Men may shave face and neck.            Do not bring valuables to the hospital.            [facility] is not responsible for any belongings or valuables.  Do NOT Smoke (Tobacco/Vaping) or drink Alcohol 24 hours prior to your procedure If you use a CPAP at night, you may bring all equipment for your overnight stay.   Contacts, glasses, dentures or bridgework may not be worn into surgery.      For patients admitted to the hospital, discharge time will be determined by your treatment team.   Patients discharged the day of surgery will not be allowed to drive home, and someone needs to stay with them for 24 hours.    Special instructions:   - Preparing For Surgery  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, you can play an important role. Because skin is not sterile, your skin needs to be as free of germs as possible. You can reduce the number of germs on your skin by washing  with CHG (chlorahexidine gluconate) Soap before surgery.  CHG is an antiseptic cleaner which kills germs and bonds with the skin to continue killing germs even after washing.    Oral Hygiene is also important to reduce your risk of infection.  Remember - BRUSH YOUR TEETH THE MORNING OF SURGERY WITH YOUR REGULAR TOOTHPASTE  Please do not use if you have an allergy to CHG or antibacterial soaps. If your skin becomes reddened/irritated stop using the CHG.  Do not shave (including legs and underarms) for at least 48 hours prior to first CHG shower. It is OK to shave your face.  Please follow these instructions carefully.   1. Shower the NIGHT BEFORE SURGERY and the MORNING OF SURGERY with CHG Soap.   2. If you chose to wash your hair, wash your hair first as usual with your normal shampoo.  3. After you shampoo, rinse your hair and body thoroughly to remove the shampoo.  4. Use CHG as you would any other liquid soap. You can apply CHG directly to the skin and wash gently with a scrungie or a clean washcloth.   5. Apply the CHG Soap to your  body ONLY FROM THE NECK DOWN.  Do not use on open wounds or open sores. Avoid contact with your eyes, ears, mouth and genitals (private parts). Wash Face and genitals (private parts)  with your normal soap.   6. Wash thoroughly, paying special attention to the area where your surgery will be performed.  7. Thoroughly rinse your body with warm water from the neck down.  8. DO NOT shower/wash with your normal soap after using and rinsing off the CHG Soap.  9. Pat yourself dry with a CLEAN TOWEL.  10. Wear CLEAN PAJAMAS to bed the night before surgery  11. Place CLEAN SHEETS on your bed the night of your first shower and DO NOT SLEEP WITH PETS.   Day of Surgery: Wear Clean/Comfortable clothing the morning of surgery Do not apply any deodorants/lotions.   Remember to brush your teeth WITH YOUR REGULAR TOOTHPASTE.   Please read over the following fact  sheets that you were given.

## 2020-04-28 ENCOUNTER — Other Ambulatory Visit (HOSPITAL_COMMUNITY)
Admission: RE | Admit: 2020-04-28 | Discharge: 2020-04-28 | Disposition: A | Discharge status: Home / Self Care | Payer: BC Managed Care – PPO | Source: Ambulatory Visit | Attending: Orthopedic Surgery | Admitting: Orthopedic Surgery

## 2020-04-28 DIAGNOSIS — Z01812 Encounter for preprocedural laboratory examination: Secondary | ICD-10-CM | POA: Insufficient documentation

## 2020-04-28 DIAGNOSIS — Z20822 Contact with and (suspected) exposure to covid-19: Secondary | ICD-10-CM | POA: Diagnosis not present

## 2020-04-28 LAB — SARS CORONAVIRUS 2 (TAT 6-24 HRS): SARS Coronavirus 2: NEGATIVE

## 2020-04-30 ENCOUNTER — Encounter (HOSPITAL_COMMUNITY): Payer: Self-pay

## 2020-04-30 ENCOUNTER — Encounter (HOSPITAL_COMMUNITY)
Admission: RE | Admit: 2020-04-30 | Discharge: 2020-04-30 | Disposition: A | Discharge status: Home / Self Care | Payer: BC Managed Care – PPO | Source: Ambulatory Visit | Attending: Orthopedic Surgery | Admitting: Orthopedic Surgery

## 2020-04-30 ENCOUNTER — Other Ambulatory Visit: Payer: Self-pay

## 2020-04-30 ENCOUNTER — Other Ambulatory Visit (HOSPITAL_COMMUNITY): Payer: BC Managed Care – PPO

## 2020-04-30 DIAGNOSIS — Z20822 Contact with and (suspected) exposure to covid-19: Secondary | ICD-10-CM | POA: Diagnosis not present

## 2020-04-30 DIAGNOSIS — Z01812 Encounter for preprocedural laboratory examination: Secondary | ICD-10-CM | POA: Insufficient documentation

## 2020-04-30 DIAGNOSIS — M5117 Intervertebral disc disorders with radiculopathy, lumbosacral region: Secondary | ICD-10-CM | POA: Diagnosis not present

## 2020-04-30 DIAGNOSIS — G4730 Sleep apnea, unspecified: Secondary | ICD-10-CM | POA: Diagnosis not present

## 2020-04-30 DIAGNOSIS — Z981 Arthrodesis status: Secondary | ICD-10-CM | POA: Diagnosis not present

## 2020-04-30 DIAGNOSIS — K645 Perianal venous thrombosis: Secondary | ICD-10-CM | POA: Diagnosis not present

## 2020-04-30 DIAGNOSIS — M4326 Fusion of spine, lumbar region: Secondary | ICD-10-CM | POA: Diagnosis not present

## 2020-04-30 DIAGNOSIS — M4316 Spondylolisthesis, lumbar region: Secondary | ICD-10-CM | POA: Diagnosis not present

## 2020-04-30 DIAGNOSIS — M5417 Radiculopathy, lumbosacral region: Secondary | ICD-10-CM | POA: Diagnosis not present

## 2020-04-30 DIAGNOSIS — M5136 Other intervertebral disc degeneration, lumbar region: Secondary | ICD-10-CM | POA: Diagnosis not present

## 2020-04-30 DIAGNOSIS — M4317 Spondylolisthesis, lumbosacral region: Secondary | ICD-10-CM | POA: Diagnosis not present

## 2020-04-30 DIAGNOSIS — I10 Essential (primary) hypertension: Secondary | ICD-10-CM | POA: Diagnosis not present

## 2020-04-30 HISTORY — DX: Anxiety disorder, unspecified: F41.9

## 2020-04-30 LAB — CBC
HCT: 42.8 % (ref 39.0–52.0)
Hemoglobin: 13.6 g/dL (ref 13.0–17.0)
MCH: 29.4 pg (ref 26.0–34.0)
MCHC: 31.8 g/dL (ref 30.0–36.0)
MCV: 92.4 fL (ref 80.0–100.0)
Platelets: 294 10*3/uL (ref 150–400)
RBC: 4.63 MIL/uL (ref 4.22–5.81)
RDW: 13.8 % (ref 11.5–15.5)
WBC: 4.9 10*3/uL (ref 4.0–10.5)
nRBC: 0 % (ref 0.0–0.2)

## 2020-04-30 LAB — APTT: aPTT: 30 seconds (ref 24–36)

## 2020-04-30 LAB — BASIC METABOLIC PANEL
Anion gap: 8 (ref 5–15)
BUN: 7 mg/dL (ref 6–20)
CO2: 25 mmol/L (ref 22–32)
Calcium: 9.3 mg/dL (ref 8.9–10.3)
Chloride: 106 mmol/L (ref 98–111)
Creatinine, Ser: 1.31 mg/dL — ABNORMAL HIGH (ref 0.61–1.24)
GFR, Estimated: >60 mL/min (ref >60)
Glucose, Bld: 117 mg/dL — ABNORMAL HIGH (ref 70–99)
Potassium: 3.8 mmol/L (ref 3.5–5.1)
Sodium: 139 mmol/L (ref 135–145)

## 2020-04-30 LAB — URINALYSIS, ROUTINE W REFLEX MICROSCOPIC
Bilirubin Urine: NEGATIVE
Glucose, UA: NEGATIVE mg/dL
Hgb urine dipstick: NEGATIVE
Ketones, ur: NEGATIVE mg/dL
Leukocytes,Ua: NEGATIVE
Nitrite: NEGATIVE
Protein, ur: NEGATIVE mg/dL
Specific Gravity, Urine: 1.014 (ref 1.005–1.030)
pH: 6 (ref 5.0–8.0)

## 2020-04-30 LAB — TYPE AND SCREEN
ABO/RH(D): B NEG
Antibody Screen: NEGATIVE

## 2020-04-30 LAB — SURGICAL PCR SCREEN
MRSA, PCR: NEGATIVE
Staphylococcus aureus: NEGATIVE

## 2020-04-30 LAB — PROTIME-INR
INR: 1.1 (ref 0.8–1.2)
Prothrombin Time: 13.3 seconds (ref 11.4–15.2)

## 2020-04-30 NOTE — Progress Notes (Signed)
PCP - [name] @ [facility] Cardiologist - na   Chest x-ray -  03/19/20 EKG - 03/19/20 Stress Test - na ECHO - na Cardiac Cath - na  Sleep Study - yes--yrs. ago CPAP - yes    Blood Thinner Instructions: na Aspirin Instructions: last dose 04/25/20   COVID TEST-  04/28/20 negative results   Anesthesia review: per MD order Antionette Poles aware)  Patient denies shortness of breath, fever, cough and chest pain at PAT appointment   All instructions explained to the patient, with a verbal understanding of the material. Patient agrees to go over the instructions while at home for a better understanding. Patient also instructed to self quarantine after being tested for COVID-19. The opportunity to ask questions was provided.

## 2020-05-01 ENCOUNTER — Ambulatory Visit: Payer: Self-pay | Admitting: Orthopedic Surgery

## 2020-05-02 ENCOUNTER — Inpatient Hospital Stay (HOSPITAL_COMMUNITY): Payer: BC Managed Care – PPO

## 2020-05-02 ENCOUNTER — Encounter (HOSPITAL_COMMUNITY): Payer: Self-pay | Admitting: Orthopedic Surgery

## 2020-05-02 ENCOUNTER — Inpatient Hospital Stay (HOSPITAL_COMMUNITY)
Admission: RE | Admit: 2020-05-02 | Discharge: 2020-05-03 | DRG: 455 | Disposition: A | Discharge status: Home / Self Care | Payer: BC Managed Care – PPO | Attending: Orthopedic Surgery | Admitting: Orthopedic Surgery

## 2020-05-02 ENCOUNTER — Encounter (HOSPITAL_COMMUNITY)
Admission: RE | Disposition: A | Discharge status: Home / Self Care | Payer: Self-pay | Source: Home / Self Care | Attending: Orthopedic Surgery

## 2020-05-02 ENCOUNTER — Inpatient Hospital Stay (HOSPITAL_COMMUNITY): Payer: BC Managed Care – PPO | Admitting: Certified Registered Nurse Anesthetist

## 2020-05-02 ENCOUNTER — Inpatient Hospital Stay (HOSPITAL_COMMUNITY): Payer: BC Managed Care – PPO | Admitting: Physician Assistant

## 2020-05-02 DIAGNOSIS — I10 Essential (primary) hypertension: Secondary | ICD-10-CM | POA: Diagnosis present

## 2020-05-02 DIAGNOSIS — M4317 Spondylolisthesis, lumbosacral region: Secondary | ICD-10-CM | POA: Diagnosis present

## 2020-05-02 DIAGNOSIS — M4326 Fusion of spine, lumbar region: Secondary | ICD-10-CM | POA: Diagnosis not present

## 2020-05-02 DIAGNOSIS — G4730 Sleep apnea, unspecified: Secondary | ICD-10-CM | POA: Diagnosis present

## 2020-05-02 DIAGNOSIS — Z981 Arthrodesis status: Secondary | ICD-10-CM | POA: Diagnosis not present

## 2020-05-02 DIAGNOSIS — Z20822 Contact with and (suspected) exposure to covid-19: Secondary | ICD-10-CM | POA: Diagnosis present

## 2020-05-02 DIAGNOSIS — Z419 Encounter for procedure for purposes other than remedying health state, unspecified: Secondary | ICD-10-CM

## 2020-05-02 DIAGNOSIS — M4316 Spondylolisthesis, lumbar region: Secondary | ICD-10-CM | POA: Diagnosis not present

## 2020-05-02 DIAGNOSIS — M5417 Radiculopathy, lumbosacral region: Secondary | ICD-10-CM | POA: Diagnosis present

## 2020-05-02 HISTORY — PX: TRANSFORAMINAL LUMBAR INTERBODY FUSION (TLIF) WITH PEDICLE SCREW FIXATION 1 LEVEL: SHX6141

## 2020-05-02 LAB — GLUCOSE, CAPILLARY: Glucose-Capillary: 132 mg/dL — ABNORMAL HIGH (ref 70–99)

## 2020-05-02 SURGERY — TRANSFORAMINAL LUMBAR INTERBODY FUSION (TLIF) WITH PEDICLE SCREW FIXATION 1 LEVEL
Anesthesia: General | Site: Spine Lumbar

## 2020-05-02 MED ORDER — PHENYLEPHRINE HCL-NACL 10-0.9 MG/250ML-% IV SOLN
INTRAVENOUS | Status: AC
  Filled 2020-05-02: qty 250

## 2020-05-02 MED ORDER — BUPIVACAINE LIPOSOME 1.3 % IJ SUSP
20.0000 mL | Freq: Once | INTRAMUSCULAR | Status: DC
  Filled 2020-05-02: qty 20

## 2020-05-02 MED ORDER — LIDOCAINE 2% (20 MG/ML) 5 ML SYRINGE
INTRAMUSCULAR | Status: AC
  Filled 2020-05-02: qty 5

## 2020-05-02 MED ORDER — ONDANSETRON HCL 4 MG PO TABS: 4.0000 mg | ORAL_TABLET | Freq: Three times a day (TID) | ORAL | 0 refills | Status: DC | PRN

## 2020-05-02 MED ORDER — PROPOFOL 10 MG/ML IV BOLUS
INTRAVENOUS | Status: AC
  Filled 2020-05-02: qty 40

## 2020-05-02 MED ORDER — ACETAMINOPHEN 650 MG RE SUPP: 650.0000 mg | RECTAL | Status: DC | PRN

## 2020-05-02 MED ORDER — TRANEXAMIC ACID-NACL 1000-0.7 MG/100ML-% IV SOLN
INTRAVENOUS | Status: DC | PRN
  Administered 2020-05-02: 1000 mg via INTRAVENOUS

## 2020-05-02 MED ORDER — OXYCODONE HCL 5 MG PO TABS: 5.0000 mg | ORAL_TABLET | ORAL | Status: DC | PRN

## 2020-05-02 MED ORDER — POLYETHYLENE GLYCOL 3350 17 G PO PACK: 17.0000 g | PACK | Freq: Every day | ORAL | Status: DC | PRN

## 2020-05-02 MED ORDER — GABAPENTIN 300 MG PO CAPS
300.0000 mg | ORAL_CAPSULE | Freq: Every day | ORAL | Status: DC | PRN
Start: 2020-05-02 — End: 2020-05-03

## 2020-05-02 MED ORDER — PHENYLEPHRINE HCL (PRESSORS) 10 MG/ML IV SOLN
INTRAVENOUS | Status: AC
  Filled 2020-05-02: qty 1

## 2020-05-02 MED ORDER — FENTANYL CITRATE (PF) 100 MCG/2ML IJ SOLN
INTRAMUSCULAR | Status: DC | PRN
  Administered 2020-05-02: 150 ug via INTRAVENOUS

## 2020-05-02 MED ORDER — ONDANSETRON HCL 4 MG/2ML IJ SOLN
4.0000 mg | Freq: Four times a day (QID) | INTRAMUSCULAR | Status: DC | PRN
  Administered 2020-05-02: 4 mg via INTRAVENOUS
  Filled 2020-05-02: qty 2

## 2020-05-02 MED ORDER — SODIUM CHLORIDE 0.9 % IV SOLN: 250.0000 mL | INTRAVENOUS | Status: DC

## 2020-05-02 MED ORDER — PROPOFOL 10 MG/ML IV BOLUS
INTRAVENOUS | Status: DC | PRN
  Administered 2020-05-02: 200 mg via INTRAVENOUS
  Administered 2020-05-02: 50 mg via INTRAVENOUS
  Administered 2020-05-02 (×2): 100 mg via INTRAVENOUS

## 2020-05-02 MED ORDER — FENTANYL CITRATE (PF) 100 MCG/2ML IJ SOLN: 25.0000 ug | INTRAMUSCULAR | Status: DC | PRN

## 2020-05-02 MED ORDER — MAGNESIUM CITRATE PO SOLN
1.0000 | Freq: Once | ORAL | Status: DC | PRN
  Filled 2020-05-02 (×2): qty 296

## 2020-05-02 MED ORDER — MIDAZOLAM HCL 5 MG/5ML IJ SOLN
INTRAMUSCULAR | Status: DC | PRN
  Administered 2020-05-02: 2 mg via INTRAVENOUS

## 2020-05-02 MED ORDER — SUCCINYLCHOLINE CHLORIDE 20 MG/ML IJ SOLN
INTRAMUSCULAR | Status: DC | PRN
  Administered 2020-05-02: 140 mg via INTRAVENOUS

## 2020-05-02 MED ORDER — MORPHINE SULFATE (PF) 2 MG/ML IV SOLN
2.0000 mg | INTRAVENOUS | Status: AC | PRN
  Administered 2020-05-02: 2 mg via INTRAVENOUS
  Filled 2020-05-02: qty 1

## 2020-05-02 MED ORDER — BUPIVACAINE-EPINEPHRINE (PF) 0.25% -1:200000 IJ SOLN
INTRAMUSCULAR | Status: AC
  Filled 2020-05-02: qty 30

## 2020-05-02 MED ORDER — SUFENTANIL CITRATE 250 MCG/5ML IV SOLN
0.2500 ug/kg/h | Freq: Once | INTRAVENOUS | Status: AC
  Administered 2020-05-02: .25 ug/kg/h via INTRAVENOUS
  Filled 2020-05-02: qty 5

## 2020-05-02 MED ORDER — LACTATED RINGERS IV SOLN: INTRAVENOUS | Status: DC

## 2020-05-02 MED ORDER — PROPOFOL 10 MG/ML IV BOLUS
INTRAVENOUS | Status: AC
  Filled 2020-05-02: qty 20

## 2020-05-02 MED ORDER — HYDROMORPHONE HCL 1 MG/ML IJ SOLN
INTRAMUSCULAR | Status: DC | PRN
  Administered 2020-05-02: .5 mg via INTRAVENOUS

## 2020-05-02 MED ORDER — ONDANSETRON HCL 4 MG/2ML IJ SOLN
INTRAMUSCULAR | Status: AC
  Filled 2020-05-02: qty 2

## 2020-05-02 MED ORDER — OXYCODONE-ACETAMINOPHEN 10-325 MG PO TABS: 1.0000 | ORAL_TABLET | Freq: Four times a day (QID) | ORAL | 0 refills | Status: AC | PRN

## 2020-05-02 MED ORDER — THROMBIN 20000 UNITS EX KIT
PACK | CUTANEOUS | Status: AC
  Filled 2020-05-02: qty 1

## 2020-05-02 MED ORDER — THROMBIN 20000 UNITS EX SOLR
CUTANEOUS | Status: AC
  Filled 2020-05-02: qty 20000

## 2020-05-02 MED ORDER — FENTANYL CITRATE (PF) 250 MCG/5ML IJ SOLN
INTRAMUSCULAR | Status: AC
  Filled 2020-05-02: qty 5

## 2020-05-02 MED ORDER — METHOCARBAMOL 1000 MG/10ML IJ SOLN
500.0000 mg | Freq: Four times a day (QID) | INTRAVENOUS | Status: DC | PRN
  Administered 2020-05-02: 500 mg via INTRAVENOUS
  Filled 2020-05-02 (×2): qty 5

## 2020-05-02 MED ORDER — CEFAZOLIN SODIUM-DEXTROSE 1-4 GM/50ML-% IV SOLN
1.0000 g | Freq: Three times a day (TID) | INTRAVENOUS | Status: AC
Start: 2020-05-02 — End: 2020-05-03
  Administered 2020-05-02 – 2020-05-03 (×2): 1 g via INTRAVENOUS
  Filled 2020-05-02 (×2): qty 50

## 2020-05-02 MED ORDER — ALBUMIN HUMAN 5 % IV SOLN: INTRAVENOUS | Status: DC | PRN

## 2020-05-02 MED ORDER — ACETAMINOPHEN 500 MG PO TABS
1000.0000 mg | ORAL_TABLET | Freq: Once | ORAL | Status: AC
  Administered 2020-05-02: 1000 mg via ORAL
  Filled 2020-05-02: qty 2

## 2020-05-02 MED ORDER — THROMBIN 20000 UNITS EX SOLR
CUTANEOUS | Status: DC | PRN
  Administered 2020-05-02: 20 mL via TOPICAL

## 2020-05-02 MED ORDER — KETAMINE HCL 50 MG/5ML IJ SOSY
PREFILLED_SYRINGE | INTRAMUSCULAR | Status: AC
  Filled 2020-05-02: qty 5

## 2020-05-02 MED ORDER — MIDAZOLAM HCL 2 MG/2ML IJ SOLN
INTRAMUSCULAR | Status: AC
  Filled 2020-05-02: qty 2

## 2020-05-02 MED ORDER — SODIUM CHLORIDE 0.9% FLUSH: 3.0000 mL | INTRAVENOUS | Status: DC | PRN

## 2020-05-02 MED ORDER — MENTHOL 3 MG MT LOZG: 1.0000 | LOZENGE | OROMUCOSAL | Status: DC | PRN

## 2020-05-02 MED ORDER — CEFAZOLIN SODIUM-DEXTROSE 2-4 GM/100ML-% IV SOLN
2.0000 g | INTRAVENOUS | Status: AC
  Administered 2020-05-02: 2 g via INTRAVENOUS
  Filled 2020-05-02: qty 100

## 2020-05-02 MED ORDER — DEXAMETHASONE SODIUM PHOSPHATE 10 MG/ML IJ SOLN
INTRAMUSCULAR | Status: DC | PRN
  Administered 2020-05-02: 10 mg via INTRAVENOUS

## 2020-05-02 MED ORDER — TRANEXAMIC ACID-NACL 1000-0.7 MG/100ML-% IV SOLN
INTRAVENOUS | Status: AC
  Filled 2020-05-02: qty 100

## 2020-05-02 MED ORDER — PHENYLEPHRINE HCL-NACL 10-0.9 MG/250ML-% IV SOLN
INTRAVENOUS | Status: DC | PRN
  Administered 2020-05-02: 40 ug/min via INTRAVENOUS

## 2020-05-02 MED ORDER — BUPIVACAINE-EPINEPHRINE 0.25% -1:200000 IJ SOLN
INTRAMUSCULAR | Status: DC | PRN
  Administered 2020-05-02: 15 mL
  Administered 2020-05-02: 10 mL

## 2020-05-02 MED ORDER — LIDOCAINE 2% (20 MG/ML) 5 ML SYRINGE
INTRAMUSCULAR | Status: DC | PRN
  Administered 2020-05-02: 60 mg via INTRAVENOUS

## 2020-05-02 MED ORDER — DEXAMETHASONE SODIUM PHOSPHATE 10 MG/ML IJ SOLN
INTRAMUSCULAR | Status: AC
  Filled 2020-05-02: qty 1

## 2020-05-02 MED ORDER — 0.9 % SODIUM CHLORIDE (POUR BTL) OPTIME
TOPICAL | Status: DC | PRN
  Administered 2020-05-02: 1000 mL

## 2020-05-02 MED ORDER — NEBIVOLOL HCL 5 MG PO TABS
5.0000 mg | ORAL_TABLET | Freq: Every day | ORAL | Status: DC
  Administered 2020-05-03: 5 mg via ORAL
  Filled 2020-05-02: qty 1

## 2020-05-02 MED ORDER — AMISULPRIDE (ANTIEMETIC) 5 MG/2ML IV SOLN: 10.0000 mg | Freq: Once | INTRAVENOUS | Status: DC | PRN

## 2020-05-02 MED ORDER — ROCURONIUM BROMIDE 10 MG/ML (PF) SYRINGE
PREFILLED_SYRINGE | INTRAVENOUS | Status: AC
  Filled 2020-05-02: qty 10

## 2020-05-02 MED ORDER — METHOCARBAMOL 500 MG PO TABS: 500.0000 mg | ORAL_TABLET | Freq: Three times a day (TID) | ORAL | 0 refills | Status: AC | PRN

## 2020-05-02 MED ORDER — SODIUM CHLORIDE 0.9% FLUSH: 3.0000 mL | Freq: Two times a day (BID) | INTRAVENOUS | Status: DC

## 2020-05-02 MED ORDER — ACETAMINOPHEN 325 MG PO TABS
650.0000 mg | ORAL_TABLET | ORAL | Status: DC | PRN
  Administered 2020-05-02 – 2020-05-03 (×2): 650 mg via ORAL
  Filled 2020-05-02 (×2): qty 2

## 2020-05-02 MED ORDER — ORAL CARE MOUTH RINSE: 15.0000 mL | Freq: Once | OROMUCOSAL | Status: AC

## 2020-05-02 MED ORDER — OXYCODONE HCL 5 MG PO TABS
10.0000 mg | ORAL_TABLET | ORAL | Status: DC | PRN
  Administered 2020-05-02 – 2020-05-03 (×6): 10 mg via ORAL
  Filled 2020-05-02 (×6): qty 2

## 2020-05-02 MED ORDER — HEMOSTATIC AGENTS (NO CHARGE) OPTIME
TOPICAL | Status: DC | PRN
  Administered 2020-05-02 (×2): 1 via TOPICAL

## 2020-05-02 MED ORDER — METHYLPREDNISOLONE ACETATE 40 MG/ML IJ SUSP
INTRAMUSCULAR | Status: DC | PRN
  Administered 2020-05-02: 40 mg

## 2020-05-02 MED ORDER — BUPIVACAINE LIPOSOME 1.3 % IJ SUSP
INTRAMUSCULAR | Status: DC | PRN
Start: 2020-05-02 — End: 2020-05-02
  Administered 2020-05-02: 20 mL

## 2020-05-02 MED ORDER — CHLORHEXIDINE GLUCONATE 0.12 % MT SOLN
15.0000 mL | Freq: Once | OROMUCOSAL | Status: AC
  Administered 2020-05-02: 15 mL via OROMUCOSAL
  Filled 2020-05-02: qty 15

## 2020-05-02 MED ORDER — METHYLPREDNISOLONE ACETATE 40 MG/ML IJ SUSP
INTRAMUSCULAR | Status: AC
  Filled 2020-05-02: qty 1

## 2020-05-02 MED ORDER — ONDANSETRON HCL 4 MG PO TABS: 4.0000 mg | ORAL_TABLET | Freq: Four times a day (QID) | ORAL | Status: DC | PRN

## 2020-05-02 MED ORDER — GABAPENTIN 300 MG PO CAPS
300.0000 mg | ORAL_CAPSULE | Freq: Once | ORAL | Status: AC
  Administered 2020-05-02: 300 mg via ORAL
  Filled 2020-05-02: qty 1

## 2020-05-02 MED ORDER — KETAMINE HCL 10 MG/ML IJ SOLN
INTRAMUSCULAR | Status: DC | PRN
  Administered 2020-05-02 (×3): 10 mg via INTRAVENOUS
  Administered 2020-05-02: 50 mg via INTRAVENOUS

## 2020-05-02 MED ORDER — METHOCARBAMOL 500 MG PO TABS
500.0000 mg | ORAL_TABLET | Freq: Four times a day (QID) | ORAL | Status: DC | PRN
  Administered 2020-05-03 (×3): 500 mg via ORAL
  Filled 2020-05-02 (×3): qty 1

## 2020-05-02 MED ORDER — HYDROMORPHONE HCL 1 MG/ML IJ SOLN
INTRAMUSCULAR | Status: AC
  Filled 2020-05-02: qty 0.5

## 2020-05-02 MED ORDER — PHENOL 1.4 % MT LIQD: 1.0000 | OROMUCOSAL | Status: DC | PRN

## 2020-05-02 SURGICAL SUPPLY — 78 items
BLADE CLIPPER SURG (BLADE) IMPLANT
BUR EGG ELITE 4.0 (BURR) ×2 IMPLANT
BUR SURG 4X8 MED (BURR) IMPLANT
BURR SURG 4X8 MED (BURR)
CABLE BIPOLOR RESECTION CORD (MISCELLANEOUS) ×2 IMPLANT
CANISTER SUCT 3000ML PPV (MISCELLANEOUS) ×2 IMPLANT
CLIP NEUROVISION LG (CLIP) ×2 IMPLANT
CLSR STERI-STRIP ANTIMIC 1/2X4 (GAUZE/BANDAGES/DRESSINGS) ×2 IMPLANT
COVER SURGICAL LIGHT HANDLE (MISCELLANEOUS) ×2 IMPLANT
COVER WAND RF STERILE (DRAPES) IMPLANT
DEVICE ENDSKLTN NANOLCK 8MM XL (Cage) ×1 IMPLANT
DRAPE C-ARM 42X72 X-RAY (DRAPES) ×2 IMPLANT
DRAPE C-ARMOR (DRAPES) ×2 IMPLANT
DRAPE POUCH INSTRU U-SHP 10X18 (DRAPES) ×2 IMPLANT
DRAPE SURG 17X23 STRL (DRAPES) ×2 IMPLANT
DRAPE U-SHAPE 47X51 STRL (DRAPES) ×2 IMPLANT
DRSG OPSITE POSTOP 4X6 (GAUZE/BANDAGES/DRESSINGS) ×4 IMPLANT
DRSG OPSITE POSTOP 4X8 (GAUZE/BANDAGES/DRESSINGS) ×2 IMPLANT
DURAPREP 26ML APPLICATOR (WOUND CARE) ×2 IMPLANT
ELECT BLADE 4.0 EZ CLEAN MEGAD (MISCELLANEOUS) ×2
ELECT BLADE 6.5 EXT (BLADE) IMPLANT
ELECT PENCIL ROCKER SW 15FT (MISCELLANEOUS) ×2 IMPLANT
ELECT REM PT RETURN 9FT ADLT (ELECTROSURGICAL) ×2
ELECTRODE BLDE 4.0 EZ CLN MEGD (MISCELLANEOUS) ×1 IMPLANT
ELECTRODE REM PT RTRN 9FT ADLT (ELECTROSURGICAL) ×1 IMPLANT
ENDOSKELETON NANOLOCK 8MM XL (Cage) ×2 IMPLANT
GLOVE BIOGEL PI IND STRL 8.5 (GLOVE) ×1 IMPLANT
GLOVE BIOGEL PI INDICATOR 8.5 (GLOVE) ×1
GLOVE SS BIOGEL STRL SZ 8.5 (GLOVE) ×1 IMPLANT
GLOVE SUPERSENSE BIOGEL SZ 8.5 (GLOVE) ×1
GOWN STRL REUS W/ TWL LRG LVL3 (GOWN DISPOSABLE) ×1 IMPLANT
GOWN STRL REUS W/TWL 2XL LVL3 (GOWN DISPOSABLE) ×4 IMPLANT
GOWN STRL REUS W/TWL LRG LVL3 (GOWN DISPOSABLE) ×2
GUIDEWIRE NITINOL BEVEL TIP (WIRE) ×8 IMPLANT
KIT BASIN OR (CUSTOM PROCEDURE TRAY) ×2 IMPLANT
KIT POSITION SURG JACKSON T1 (MISCELLANEOUS) IMPLANT
KIT TURNOVER KIT B (KITS) ×2 IMPLANT
LIGHT SOURCE ANGLE TIP STR 7FT (MISCELLANEOUS) ×2 IMPLANT
MODULE EMG NEEDLE SSEP NVM5 (NEEDLE) ×2 IMPLANT
MODULE NVM5 NEXT GEN EMG (NEEDLE) ×2 IMPLANT
NEEDLE 18GX1X1/2 (RX/OR ONLY) (NEEDLE) ×2 IMPLANT
NEEDLE 22X1 1/2 (OR ONLY) (NEEDLE) ×2 IMPLANT
NEEDLE SPNL 18GX3.5 QUINCKE PK (NEEDLE) ×4 IMPLANT
NS IRRIG 1000ML POUR BTL (IV SOLUTION) ×2 IMPLANT
PACK LAMINECTOMY ORTHO (CUSTOM PROCEDURE TRAY) ×2 IMPLANT
PACK UNIVERSAL I (CUSTOM PROCEDURE TRAY) ×2 IMPLANT
PAD ARMBOARD 7.5X6 YLW CONV (MISCELLANEOUS) ×4 IMPLANT
PATTIES SURGICAL .5 X.5 (GAUZE/BANDAGES/DRESSINGS) ×4 IMPLANT
PATTIES SURGICAL .5 X1 (DISPOSABLE) IMPLANT
POSITIONER HEAD PRONE TRACH (MISCELLANEOUS) ×2 IMPLANT
PROBE BALL TIP NVM5 SNG USE (BALLOONS) ×2 IMPLANT
PUTTY DBX 1CC (Putty) ×2 IMPLANT
PUTTY DBX 1CC DEPUY (Putty) ×1 IMPLANT
REDUCTION EXT RELINE MAS MOD (Neuro Prosthesis/Implant) ×4 IMPLANT
ROD RELINE MAS LORD 5.5X45MM (Rod) ×2 IMPLANT
ROD RELINE MAS TI LORD 5.5X40 (Rod) ×4 IMPLANT
SCREW LOCK RELINE 5.5 TULIP (Screw) ×10 IMPLANT
SCREW RED MAS POLY 7.5X40MM (Screw) ×2 IMPLANT
SCREW RED RELINE 7.5X45MM POLY (Screw) ×2 IMPLANT
SCREW SHANK RELINE 7.5X40MM 2C (Screw) ×2 IMPLANT
SCREW SHANK RELINE MOD 7.5X45 (Screw) ×2 IMPLANT
SCREW SHANK RLINE MD 7.5X45 2C (Screw) ×1 IMPLANT
SPONGE INTESTINAL PEANUT (DISPOSABLE) ×2 IMPLANT
SPONGE LAP 4X18 RFD (DISPOSABLE) ×6 IMPLANT
SPONGE SURGIFOAM ABS GEL 100 (HEMOSTASIS) ×2 IMPLANT
SURGIFLO W/THROMBIN 8M KIT (HEMOSTASIS) IMPLANT
SUT BONE WAX W31G (SUTURE) ×2 IMPLANT
SUT MNCRL AB 3-0 PS2 27 (SUTURE) ×4 IMPLANT
SUT VIC AB 1 CT1 18XCR BRD 8 (SUTURE) ×2 IMPLANT
SUT VIC AB 1 CT1 8-18 (SUTURE) ×4
SUT VIC AB 2-0 CT1 18 (SUTURE) ×4 IMPLANT
SYR BULB IRRIG 60ML STRL (SYRINGE) ×2 IMPLANT
SYR CONTROL 10ML LL (SYRINGE) ×2 IMPLANT
TOWEL GREEN STERILE (TOWEL DISPOSABLE) ×2 IMPLANT
TOWEL GREEN STERILE FF (TOWEL DISPOSABLE) ×2 IMPLANT
TRAY FOLEY MTR SLVR 16FR STAT (SET/KITS/TRAYS/PACK) ×2 IMPLANT
WATER STERILE IRR 1000ML POUR (IV SOLUTION) ×2 IMPLANT
YANKAUER SUCT BULB TIP NO VENT (SUCTIONS) ×2 IMPLANT

## 2020-05-02 NOTE — Transfer of Care (Signed)
Immediate Anesthesia Transfer of Care Note  Patient: Jason Moore  Procedure(s) Performed: TRANSFORAMINAL LUMBAR INTERBODY FUSION (TLIF) LUMBAR 5-SACRAL 1 (N/A Spine Lumbar)  Patient Location: PACU  Anesthesia Type:General  Level of Consciousness: drowsy  Airway & Oxygen Therapy: Patient Spontanous Breathing and Patient connected to face mask oxygen  Post-op Assessment: Report given to RN and Post -op Vital signs reviewed and stable  Post vital signs: Reviewed and stable  Last Vitals:  Vitals Value Taken Time  BP 133/77 05/02/20 1357  Temp    Pulse 112 05/02/20 1359  Resp 10 05/02/20 1359  SpO2 100 % 05/02/20 1359  Vitals shown include unvalidated device data.  Last Pain:  Vitals:   05/02/20 0702  TempSrc: Oral  PainSc: 3       Patients Stated Pain Goal: 1 (05/02/20 0355)  Complications: No complications documented.

## 2020-05-02 NOTE — Discharge Instructions (Signed)

## 2020-05-02 NOTE — Brief Op Note (Signed)
05/02/2020  1:31 PM  PATIENT:  Jason Moore  50 y.o. male  PRE-OPERATIVE DIAGNOSIS:  L5-S1 slip with right radicular leg pain  POST-OPERATIVE DIAGNOSIS:  L5-S1 slip with right radicular leg pain  PROCEDURE:  Procedure(s) with comments: TRANSFORAMINAL LUMBAR INTERBODY FUSION (TLIF) LUMBAR 5-SACRAL 1 (N/A) - 4.5 hrs  SURGEON:  Surgeon(s) and Role:    Venita Lick, MD - Primary  PHYSICIAN ASSISTANT:  Amanda Ward, PA  ANESTHESIA:   general  EBL:  150 mL   BLOOD ADMINISTERED:none  DRAINS: none   LOCAL MEDICATIONS USED:  MARCAINE    and OTHER exparel.  depomedrol  SPECIMEN:  No Specimen  DISPOSITION OF SPECIMEN:  PATHOLOGY  COUNTS:  YES  TOURNIQUET:  * No tourniquets in log *  DICTATION: .Dragon Dictation  PLAN OF CARE: Admit to inpatient   PATIENT DISPOSITION:  PACU - hemodynamically stable.

## 2020-05-02 NOTE — Anesthesia Procedure Notes (Signed)
Procedure Name: Intubation Date/Time: 05/02/2020 8:41 AM Performed by: Lytle Michaels, CRNA Pre-anesthesia Checklist: Patient identified, Emergency Drugs available, Suction available and Patient being monitored Patient Re-evaluated:Patient Re-evaluated prior to induction Oxygen Delivery Method: Circle system utilized Preoxygenation: Pre-oxygenation with 100% oxygen Induction Type: IV induction Ventilation: Mask ventilation without difficulty Laryngoscope Size: Miller and 2 Grade View: Grade I Tube type: Oral Tube size: 7.5 mm Number of attempts: 1 Airway Equipment and Method: Stylet and Oral airway Placement Confirmation: ETT inserted through vocal cords under direct vision,  positive ETCO2 and breath sounds checked- equal and bilateral Tube secured with: Tape Dental Injury: Teeth and Oropharynx as per pre-operative assessment

## 2020-05-02 NOTE — Anesthesia Preprocedure Evaluation (Signed)
Anesthesia Evaluation  Patient identified by MRN, date of birth, ID band Patient awake    Reviewed: Allergy & Precautions, NPO status , Patient's Chart, lab work & pertinent test results, reviewed documented beta blocker date and time   Airway Mallampati: II  TM Distance: >3 FB Neck ROM: Full    Dental  (+) Dental Advisory Given   Pulmonary sleep apnea and Continuous Positive Airway Pressure Ventilation ,    breath sounds clear to auscultation       Cardiovascular hypertension, Pt. on medications and Pt. on home beta blockers  Rhythm:Regular Rate:Normal     Neuro/Psych  Neuromuscular disease    GI/Hepatic negative GI ROS, Neg liver ROS,   Endo/Other  negative endocrine ROS  Renal/GU Renal disease     Musculoskeletal  (+) Arthritis ,   Abdominal   Peds  Hematology negative hematology ROS (+)   Anesthesia Other Findings   Reproductive/Obstetrics                             Anesthesia Physical Anesthesia Plan  ASA: II  Anesthesia Plan: General   Post-op Pain Management:    Induction: Intravenous  PONV Risk Score and Plan: 2 and Ondansetron, Dexamethasone and Treatment may vary due to age or medical condition  Airway Management Planned: Oral ETT  Additional Equipment: None  Intra-op Plan:   Post-operative Plan: Extubation in OR  Informed Consent: I have reviewed the patients History and Physical, chart, labs and discussed the procedure including the risks, benefits and alternatives for the proposed anesthesia with the patient or authorized representative who has indicated his/her understanding and acceptance.     Dental advisory given  Plan Discussed with: CRNA  Anesthesia Plan Comments:         Anesthesia Quick Evaluation

## 2020-05-02 NOTE — Op Note (Signed)
Operative report  Preoperative diagnosis: Grade 2 borderline 3 isthmic spondylolisthesis L5-S1 with significant right radicular L5 pain, and dysesthesias.  Postoperative diagnosis: Same  Operative procedure: Transforaminal lumbar interbody fusion L5-S1.  Complications: None  EBL: 150 cc  First Assistant: Glynis Smiles, PA  Implants: Titan extra long size 8 intervertebral TLIF cage.  NuVasive MIS pedicle screw fixation.  7.5 x 45 mm length screws at L5, 7.5 x 40 mm length screws at S1.  Graft: Autograft, and DBX  Intraoperative neuro monitoring: No adverse free running EMG or SSEP activity noted.  All screws tested and there is no adverse activity all screws tested no activity greater than 40 mA except for the left S1 which demonstrated positive activity at 39 mA.  Indications: Jason Moore is a very pleasant 50 year old gentleman with severe debilitating radicular leg pain and back pain.  An attempted anterior lumbar interbody fusion was undertaken 6 weeks ago that failed because of significant bleeding and inability to expose the disc space level.  The patient was ultimately discharged and allowed to heal and returns today for definitive management of his spondylolisthesis.  Patient continues to have severe radiculopathy and back pain that has gotten progressively worse.  As result of the failure of conservative management and the attempted ALIF we elected to move forward with the TLIF.  Operative report: Patient was brought the operating room placed upon the operating room table after successful induction of general anesthesia and endotracheal to patient, teds SCDs and Foley were inserted.  Patient was turned prone onto the Lori frame and all bony prominences were well-padded.  The back was prepped and draped in a standard fashion.  Timeout was taken to confirm patient procedure and all other important data.  Since she was having significant radicular right leg pain we elected to do the TLIF  cage insertion on the right side.  Jamshidi needle was used to identify the lateral aspect of the L5 and S1 pedicle and this area was marked out on the skin.  I infiltrated the area with quarter percent Marcaine with epinephrine.  Sternal incision was made and sharp dissection was carried out down to the deep fascia.  I then injected the paraspinal muscles with quarter percent Marcaine with Exparel in order to improve postoperative pain control and intraoperative hemostasis.  I then placed the Jamshidi needle through the paraspinal muscle down to the lateral aspect of the L5 pedicle.  Using fluoroscopy identified the proper starting position and then I advanced the Jamshidi needle into the L5 pedicle I used the AP fluoroscopy image as well as direct neural stimulation to confirm satisfactory positioning of the Jamshidi needle.  As I neared the medial wall of the pedicle on the AP view I switched to the lateral view.  I confirmed that it was just beyond the posterior wall the vertebral body indicating had satisfactory trajectory.  I advanced the CT needle into the vertebral body and then placed the guidepin to cannulate the pedicle.  I repeated this exact same procedure at S1 and on the contralateral side. Once all 4 pedicle were cannulated I proceeded with the screw implantation.  The L5 pedicle was tapped with a 6.5 tap and elected to use the larger 7.5 screw given the patient's size.  I placed the pedicle screw over the guidepin which was attached to the retractor blade.  Once both screws were properly positioned I directly stimulated to the pedicle screws on this right side and there was no adverse activity at greater  than 40 mA.  I placed the medial retracting blade and I exposed the posterior lateral aspect of the L5-S1 level.  The pars defect was clearly visualized and I removed the overlying cartilaginous material.  I then identified the S1 lamina and the L5 and S1 facet complex.  I then used an osteotome  to resect the inferior L5 facet, and then continue to resect the superior edge of the S1 lamina.   At this point I carefully dissected through the ligamentum flavum and developed a plane between the ligamentum flavum and the thecal sac.  Because of the structural deformity I was very careful about how aggressive I was with the Kerrison rongeurs to prevent iatrogenic durotomy.  Using the neuro patties I created a plane between the thecal sac and the overlying lamina and thickened ligamentum flavum I resected a portion of the L5 facet and the medial edge of the S1 facet.  The large epidural veins were identified and coagulated with bipolar electrocautery I continued to gently dissect superiorly in the area of the pars defect until I could identify the L5 nerve root.  Once I was able to identify the L5 nerve root I protected it with a neuro patty and continued my dissection to expose the posterior annulus.  At this point I can now palpate the inferior edge of the L4-5 pedicle and the superior edge of the S1 pedicle.  The posterior annulus was identified and I gently tracked at the L5 nerve root superiorly and an annulotomy was performed.  Using pituitary rongeurs curettes and Kerrison rongeurs I remove the bulk of the disc material.  I then used an Epstein curette to remove some of the posterior exostosis that had formed from the slip.  At this point with the discectomy complete I then went to the left-hand side and placed my pedicle screws in the same fashion that had done on the right.  Both of these pedicle screws were stimulated and there was no adverse activity at L5 at greater than 40 mA, and at 39 mA at S1.  I then placed a temporary rod and reduced slipped and held in place on the side based on his lateral imaging studies there was improvement in the overall slip it was now a low-grade 1 spondylolisthesis.  At this point I then went back to the right-hand side and continued using the curettes to remove all the  disc material and adequately prepared the intervertebral space for the cage.  The L5 nerve root was still gently retracted and protected to prevent iatrogenic injury.  There is no adverse free running EMG activity noted throughout the case in this L5 distribution.  Once I felt the discectomy was complete I then trialed the intervertebral spacers and elected to use a size 8 cage.  The 9 cage was felt to be too big and increased potential risk of direct trauma to the L4 nerve root during insertion.  The size 8 cage extra long was packed with the autograft bone that had been harvested from the decompression and then a layer of DBX was placed to seal the edges.  With the thecal sac and nerve root protected I inserted the device into the intervertebral space.  The cage was brought into a vertical position and then kicked over into a horizontal position.  It was then advanced and so it was in the anterior third of the disc space.  Final x-rays demonstrated satisfactory positioning of the cage in the intervertebral space.  He had excellent contact with the endplates and it was not contacting the L5 nerve root.  At this point time with the intervertebral cage in place I remove the temporary rod on the left-hand side and then irrigated the right wound copiously with normal saline.  With the intervertebral cage properly positioned I performed a slightly more aggressive laminotomy of L5.  I removed more of the L5 lamina so that I can track the L5 nerve root from the foramen to the origin on the thecal sac.  The ligamentum flavum recess was resected in order to adequately decompress the nerve.  At this point I could clearly visualize the L5 nerve root from its origin down in the lateral recess on the medial side of the L5 pedicle and into the foramen.  It was freely mobile and no longer under significant compression.  At this point I was pleased with the positioning of the intervertebral cage and the decompression.  I  irrigated this wound copiously normal saline and used bipolar cautery and Floseal to obtain and maintain hemostasis.  Approximately 1/2 cc (20 mg) of Depo-Medrol was placed over the L5 nerve root to aid in postoperative analgesia.  The polyaxial heads were attached to the screws and they were advanced down to the appropriate depth.  The retracting devices were removed and I placed a 40 mm length rod and secured to the pedicle screw.  The locking caps were inserted and torqued according to manufacture standards.  At this point had excellent fixation on the right side and the x-rays were satisfactory.  I then went to the left side and exposed the L5-S1 facet complex.  Using a high-speed bur I decorticated this facet joint as well as the transverse process of L5 and a portion of that of S1.  At this point I then placed the remaining portion of bone graft in the posterior lateral gutter and in the L5-S1 facet to aid in the fusion.  I then placed a 40 mm length rod and locked it using the locking caps according manufacture standards.  The insertion tabs were broken off and removed and I took my final x-rays.  I had satisfactory position of the implant and the pedicle screws.  I then checked 1 last time to ensure that my decompression was adequate.  I could easily pass my Woodson elevator superiorly in the lateral recess I have visualized the entire L5 nerve root and I could pass my Woodson elevator down into the S1 foramen without any difficulty.  At this point time both wounds were copiously irrigated with and closed in a layered fashion with interrupted #1 Vicryl suture, 2-0 Vicryl suture, and 3-0 Monocryl for the skin.  Prior to wound closure we did confirm hemostasis with direct visualization and bipolar cautery.  And the wounds were copiously irrigated before closure.  Dry dressings were applied and the patient was ultimately extubated transfer the PACU without incident.  The end of the case all needle and  sponge counts were correct.  There were no adverse intraoperative events.

## 2020-05-02 NOTE — Anesthesia Postprocedure Evaluation (Signed)
Anesthesia Post Note  Patient: Jason Moore  Procedure(s) Performed: TRANSFORAMINAL LUMBAR INTERBODY FUSION (TLIF) LUMBAR 5-SACRAL 1 (N/A Spine Lumbar)     Patient location during evaluation: PACU Anesthesia Type: General Level of consciousness: awake Pain management: pain level controlled Vital Signs Assessment: post-procedure vital signs reviewed and stable Respiratory status: spontaneous breathing Cardiovascular status: stable Postop Assessment: no apparent nausea or vomiting Anesthetic complications: no   No complications documented.  Last Vitals:  Vitals:   05/02/20 1357 05/02/20 1400  BP: 133/77   Pulse: (!) 111 (!) 112  Resp: 20 10  Temp: (!) 36.1 C   SpO2: 96% 100%    Last Pain:  Vitals:   05/02/20 1357  TempSrc:   PainSc: Asleep                 Ranae Casebier

## 2020-05-02 NOTE — H&P (Signed)
Addendum H&P  Patient returns today for L5 and S1 decompression and fusion.  Patient is continued to have severe back buttock and progressive right radiculopathy.  As result of the progressive neuropathic pain and dysesthesias we have elected to move forward with the L5-S1 TLIF.  I have gone over the surgical procedure with the patient including all risks, benefits, and alternatives.  All of his questions were addressed.  There is been no significant change in his clinical exam since his last office note of 04/20/2020.

## 2020-05-03 ENCOUNTER — Encounter (HOSPITAL_COMMUNITY): Payer: Self-pay | Admitting: Orthopedic Surgery

## 2020-05-03 MED ORDER — FLEET ENEMA 7-19 GM/118ML RE ENEM
1.0000 | ENEMA | Freq: Once | RECTAL | Status: AC
  Administered 2020-05-03: 1 via RECTAL
  Filled 2020-05-03: qty 1

## 2020-05-03 MED ORDER — SENNOSIDES-DOCUSATE SODIUM 8.6-50 MG PO TABS: 1.0000 | ORAL_TABLET | Freq: Every evening | ORAL | Status: DC | PRN

## 2020-05-03 NOTE — Progress Notes (Signed)
Patient is discharged from room 3C03 at this time. Alert and in stable condition. IV site d/c'd and instructions read to patient and spouse with understanding verbalized and all questions answered. Left unit via wheelchair with all belongings at side. ?

## 2020-05-03 NOTE — Progress Notes (Signed)
    Subjective: Procedure(s) (LRB): TRANSFORAMINAL LUMBAR INTERBODY FUSION (TLIF) LUMBAR 5-SACRAL 1 (N/A) 1 Day Post-Op  Patient reports pain as 3 on 0-10 scale.  Reports decreased leg pain reports incisional back pain   Positive void Negative bowel movement Negative flatus Negative chest pain or shortness of breath  Objective: Vital signs in last 24 hours: Temp:  [97.7 F (36.5 C)-99.6 F (37.6 C)] 99.6 F (37.6 C) (01/20 0730) Pulse Rate:  [76-113] 76 (01/20 0730) Resp:  [9-20] 18 (01/20 0730) BP: (127-163)/(59-85) 158/80 (01/20 0730) SpO2:  [93 %-100 %] 99 % (01/20 0730)  Intake/Output from previous day: 01/19 0701 - 01/20 0700 In: 1850 [I.V.:1600; IV Piggyback:250] Out: 400 [Urine:250; Blood:150]  Labs: Recent Labs    04/30/20 1041  WBC 4.9  RBC 4.63  HCT 42.8  PLT 294   Recent Labs    04/30/20 1041  NA 139  K 3.8  CL 106  CO2 25  BUN 7  CREATININE 1.31*  GLUCOSE 117*  CALCIUM 9.3   Recent Labs    04/30/20 1041  INR 1.1    Physical Exam: Neurologically intact ABD soft Intact pulses distally Dorsiflexion/Plantar flexion intact Incision: dressing C/D/I and no drainage Compartment soft There is no height or weight on file to calculate BMI.   Assessment/Plan: Patient stable  xrays n/a Continue mobilization with physical therapy Continue care  Advance diet Up with therapy  Patient doing well overall. Plan on d/c today if cleared by PT and having positive flatus.   Venita Lick, MD [facility] [phone]

## 2020-05-03 NOTE — Evaluation (Signed)
Occupational Therapy Evaluation Patient Details Name: Jason Moore MRN: [id number] DOB: 06-Aug-1970 Today's Date: 05/03/2020    History of Present Illness 50 y.o. male presenting with progressive neuropathic pain and dysesthesia s/p L5 and S1 TLIF on 1/19 by Dr. Shon Baton. PMHx significant for L5-S1 ALIF on 12/8, DDD, HTN, and sleep apnea.   Clinical Impression   PTA patient was independent with ADLs/IADLs without AD and was working full-time as a Optician, dispensing. Since previous back surgery on 12/8, patient has been working from home. Patient currently functioning at Mod I level grossly for observed ADLs including UB/LB bathing/dressing and LB dressing in sitting/standing without AE/AD and good adherence to back precautions. Patient able to recall 3/3 back precautions and don/doff lumbar corset independently. Patient does not require continued acute occupational therapy services with OT to sign off at this time.     Follow Up Recommendations  No OT follow up    Equipment Recommendations  None recommended by OT    Recommendations for Other Services       Precautions / Restrictions Precautions Precautions: Fall;Back Precaution Booklet Issued: No Precaution Comments: Reviewed back precautions. Patient able to recall 3/3 precautions from previous surgery. Restrictions Weight Bearing Restrictions: No      Mobility Bed Mobility Overal bed mobility: Modified Independent                  Transfers Overall transfer level: Modified independent Equipment used: None                  Balance Overall balance assessment: Mild deficits observed, not formally tested                                         ADL either performed or assessed with clinical judgement   ADL Overall ADL's : Modified independent                                       General ADL Comments: Patient demonstrates UB bathing/dressing and LB dressing with Mod I in  sitting/standing. Good adherence to back precautions.     Vision   Vision Assessment?: No apparent visual deficits     Perception     Praxis      Pertinent Vitals/Pain Pain Assessment: No/denies pain     Hand Dominance     Extremity/Trunk Assessment Upper Extremity Assessment Upper Extremity Assessment: Overall WFL for tasks assessed   Lower Extremity Assessment Lower Extremity Assessment: Defer to PT evaluation   Cervical / Trunk Assessment Cervical / Trunk Assessment: Other exceptions Cervical / Trunk Exceptions: s/p spinal surgery   Communication Communication Communication: No difficulties   Cognition Arousal/Alertness: Awake/alert Behavior During Therapy: WFL for tasks assessed/performed Overall Cognitive Status: Within Functional Limits for tasks assessed                                     General Comments  Clean, dry dressing at incision.    Exercises     Shoulder Instructions      Home Living Family/patient expects to be discharged to:: Private residence Living Arrangements: Spouse/significant other Available Help at Discharge: Family;Available 24 hours/day Type of Home: House Home Access: Stairs to enter [employer] of Steps:  3-5 Entrance Stairs-Rails: Right;Left;Can reach both Home Layout: One level     Bathroom Shower/Tub: Producer, television/film/video: Handicapped height (in 1 bathroom, in 2 bathrooms standard height.) Bathroom Accessibility: No   Home Equipment: Other (comment);Toilet riser;Shower seat (walking sticks)          Prior Functioning/Environment Level of Independence: Independent        Comments: Independent with ADLs/IADLs including working full-time Engineer, production parts.        OT Problem List: Pain      OT Treatment/Interventions:      OT Goals(Current goals can be found in the care plan section) Acute Rehab OT Goals Patient Stated Goal: To return home and to work. OT Goal  Formulation: With patient  OT Frequency:     Barriers to D/C:            Co-evaluation              AM-PAC OT "6 Clicks" Daily Activity     Outcome Measure Help from another person eating meals?: None Help from another person taking care of personal grooming?: None Help from another person toileting, which includes using toliet, bedpan, or urinal?: None Help from another person bathing (including washing, rinsing, drying)?: None Help from another person to put on and taking off regular upper body clothing?: None Help from another person to put on and taking off regular lower body clothing?: None 6 Click Score: 24   End of Session Nurse Communication: Mobility status  Activity Tolerance: Patient tolerated treatment well Patient left: in chair;with call bell/phone within reach  OT Visit Diagnosis: Pain Pain - part of body:  (Back (incisional))                Time: 6226-3335 OT Time Calculation (min): 29 min Charges:  OT General Charges $OT Visit: 1 Visit OT Evaluation $OT Eval Low Complexity: 1 Low OT Treatments $Self Care/Home Management : 8-22 mins  Ziquan Fidel H. OTR/L Supplemental OT, Department of rehab services [phone]  Xaden Kaufman R H. 05/03/2020, 8:03 AM

## 2020-05-03 NOTE — Evaluation (Signed)
Physical Therapy Evaluation Patient Details Name: Jason Moore MRN: [id number] DOB: Mar 06, 1971 Today's Date: 05/03/2020   History of Present Illness  50 y.o. male presenting with progressive neuropathic pain and dysesthesia s/p L5 and S1 TLIF on 1/19 by Dr. Rolena Infante. PMHx significant for L5-S1 ALIF on 12/8, DDD, HTN, and sleep apnea.  Clinical Impression   Patient received sitting at EOB, very cooperative and pleasant, spouse present through session and very supportive. Able to mobilize well today with good recall of all precautions but did require cues to maintain precautions functionally this morning. Did well with stair training and gait, preferred using RW for longer distances however did well with short distance ambulation in room without BUE support. Also practiced car transfers and bed mobility. Left sitting at EOB with spouse present, all needs otherwise met. Will benefit from skilled OP PT services once cleared for participation by MD.     Follow Up Recommendations Outpatient PT;Other (comment) (when cleared by surgeon)    Equipment Recommendations  Rolling walker with 5" wheels;3in1 (PT);Other (comment) (bariatric 3 in 1)    Recommendations for Other Services       Precautions / Restrictions Precautions Precautions: Fall;Back Precaution Booklet Issued: No Precaution Comments: Reviewed back precautions. Patient able to recall 3/3 precautions from previous surgery. Required Braces or Orthoses: Spinal Brace Spinal Brace: Lumbar corset;Applied in sitting position Restrictions Weight Bearing Restrictions: No      Mobility  Bed Mobility Overal bed mobility: Modified Independent;Needs Assistance Bed Mobility: Rolling;Sidelying to Sit;Sit to Sidelying Rolling: Modified independent (Device/Increase time) Sidelying to sit: Supervision     Sit to sidelying: Supervision General bed mobility comments: HOB flat and rails lowered to simulate home environment. Pt was able to  complete without assist. Still needed review of technique and step by step cues at times    Transfers Overall transfer level: Modified independent Equipment used: None             General transfer comment: No assist required to power-up to full stand. No unsteadiness or LOB noted.  Ambulation/Gait Ambulation/Gait assistance: Supervision Gait Distance (Feet): 350 Feet Assistive device: Rolling walker (2 wheeled) Gait Pattern/deviations: Step-through pattern;Decreased stride length;Trunk flexed Gait velocity: Decreased   General Gait Details: slow but steady with RW; would probably do OK without device but still feels a little weak in BLEs and feels more confident with BUE support  Stairs   Stairs assistance: Min guard Stair Management: One rail Right;Step to pattern;Forwards Number of Stairs: 12 General stair comments: VC's for sequencing and general safety.  Wheelchair Mobility    Modified Rankin (Stroke Patients Only)       Balance Overall balance assessment: Mild deficits observed, not formally tested                                           Pertinent Vitals/Pain Pain Assessment: 0-10 Pain Score: 6  Pain Location: hip flexors bilaterally Pain Descriptors / Indicators: Operative site guarding;Aching;Tightness Pain Intervention(s): Limited activity within patient's tolerance;Monitored during session    [place] expects to be discharged to:: Private residence Living Arrangements: Spouse/significant other Available Help at Discharge: Family;Available 24 hours/day Type of Home: House Home Access: Stairs to enter Entrance Stairs-Rails: Right;Left;Can reach both Entrance Stairs-Number of Steps: 3-5 Home Layout: One level Home Equipment: Other (comment);Toilet riser;Shower seat (walking sticks)      Prior Function Level of Independence:  Independent         Comments: Independent with ADLs/IADLs including working  full-time [employer].     Hand Dominance        Extremity/Trunk Assessment   Upper Extremity Assessment Upper Extremity Assessment: Defer to OT evaluation    Lower Extremity Assessment Lower Extremity Assessment: Generalized weakness    Cervical / Trunk Assessment Cervical / Trunk Assessment: Other exceptions Cervical / Trunk Exceptions: s/p spinal surgery  Communication   Communication: No difficulties  Cognition Arousal/Alertness: Awake/alert Behavior During Therapy: WFL for tasks assessed/performed Overall Cognitive Status: Within Functional Limits for tasks assessed                                        General Comments General comments (skin integrity, edema, etc.): Clean, dry dressing at incision.    Exercises     Assessment/Plan    PT Assessment Patient needs continued PT services  PT Problem List Decreased strength;Decreased knowledge of use of DME;Obesity;Decreased activity tolerance;Decreased safety awareness;Decreased balance;Decreased knowledge of precautions;Pain;Decreased mobility;Decreased coordination       PT Treatment Interventions DME instruction;Balance training;Gait training;Stair training;Functional mobility training;Patient/family education;Therapeutic activities;Therapeutic exercise    PT Goals (Current goals can be found in the Care Plan section)  Acute Rehab PT Goals Patient Stated Goal: To return home and to work. PT Goal Formulation: With patient/family Time For Goal Achievement: 05/24/20 Potential to Achieve Goals: Good    Frequency Min 5X/week   Barriers to discharge        Co-evaluation               AM-PAC PT "6 Clicks" Mobility  Outcome Measure Help needed turning from your back to your side while in a flat bed without using bedrails?: None Help needed moving from lying on your back to sitting on the side of a flat bed without using bedrails?: A Little Help needed moving to and from a bed  to a chair (including a wheelchair)?: None Help needed standing up from a chair using your arms (e.g., wheelchair or bedside chair)?: None Help needed to walk in hospital room?: A Little Help needed climbing 3-5 steps with a railing? : A Little 6 Click Score: 21    End of Session Equipment Utilized During Treatment: Back brace Activity Tolerance: Patient tolerated treatment well Patient left: in bed;with call bell/phone within reach;with family/visitor present (sitting at EOB) Nurse Communication: Mobility status PT Visit Diagnosis: Pain;Difficulty in walking, not elsewhere classified (R26.2) Pain - Right/Left:  (bilateral) Pain - part of body: Hip    Time: 2224-1146 PT Time Calculation (min) (ACUTE ONLY): 29 min   Charges:   PT Evaluation $PT Eval Moderate Complexity: 1 Mod PT Treatments $Gait Training: 8-22 mins       Windell Norfolk, DPT, PN1   Supplemental Physical Therapist [place]    Pager [phone] Acute Rehab Office [phone]

## 2020-05-05 NOTE — Discharge Summary (Signed)
Patient ID: Jason Moore MRN: [id number] DOB/AGE: 50-Nov-1972 50 y.o.  Admit date: 05/02/2020 Discharge date: 05/05/2020  Admission Diagnoses:  Active Problems:   Fusion of lumbar spine   Discharge Diagnoses:  Active Problems:   Fusion of lumbar spine  status post Procedure(s): TRANSFORAMINAL LUMBAR INTERBODY FUSION (TLIF) LUMBAR 5-SACRAL 1  Past Medical History:  Diagnosis Date  . Anxiety   . Back pain   . Degeneration of lumbar intervertebral disc   . Hypertension   . Isthmic spondylolisthesis   . Joint pain   . Lumbar radiculopathy   . Numbness and tingling   . Sleep apnea    cpap @ [facility]    Surgeries: Procedure(s): TRANSFORAMINAL LUMBAR INTERBODY FUSION (TLIF) LUMBAR 5-SACRAL 1 on 05/02/2020   Consultants:   Discharged Condition: Improved  Hospital Course: Jason Moore is an 50 y.o. male who was admitted 05/02/2020 for operative treatment of Grade 2 borderline 3 isthmic spondylolisthesis L5-S1 with significant right radicular L5 pain, and dysesthesias.. Patient failed conservative treatments (please see the history and physical for the specifics) and had severe unremitting pain that affects sleep, daily activities and work/hobbies. After pre-op clearance, the patient was taken to the operating room on 05/02/2020 and underwent  Procedure(s): TRANSFORAMINAL LUMBAR INTERBODY FUSION (TLIF) LUMBAR 5-SACRAL 1.    Patient was given perioperative antibiotics:  Anti-infectives (From admission, onward)   Start     Dose/Rate Route Frequency Ordered Stop   05/02/20 1700  ceFAZolin (ANCEF) IVPB 1 g/50 mL premix        1 g 100 mL/hr over 30 Minutes Intravenous Every 8 hours 05/02/20 1608 05/03/20 1539   05/02/20 0643  ceFAZolin (ANCEF) IVPB 2g/100 mL premix        2 g 200 mL/hr over 30 Minutes Intravenous 30 min pre-op 05/02/20 3009 05/02/20 0915       Patient was given sequential compression devices and early ambulation to prevent DVT.   Patient benefited maximally  from hospital stay and there were no complications. At the time of discharge, the patient was urinating/moving their bowels without difficulty, tolerating a regular diet, pain is controlled with oral pain medications and they have been cleared by PT/OT.   Recent vital signs: No data found.   Recent laboratory studies: No results for input(s): WBC, HGB, HCT, PLT, NA, K, CL, CO2, BUN, CREATININE, GLUCOSE, INR, CALCIUM in the last 72 hours.  Invalid input(s): PT, 2   Discharge Medications:   Allergies as of 05/03/2020   No Known Allergies     Medication List    STOP taking these medications   ascorbic acid 500 MG tablet Commonly known as: VITAMIN C   aspirin EC 81 MG tablet   Fish Oil 1200 MG Caps   multivitamin with minerals Tabs tablet   Vitamin D-3 125 MCG (5000 UT) Tabs     TAKE these medications   Bystolic 5 MG tablet Generic drug: nebivolol Take 5 mg by mouth daily.   gabapentin 300 MG capsule Commonly known as: NEURONTIN Take 300 mg by mouth daily as needed (pain).   methocarbamol 500 MG tablet Commonly known as: Robaxin Take 1 tablet (500 mg total) by mouth every 8 (eight) hours as needed for up to 5 days for muscle spasms.   ondansetron 4 MG tablet Commonly known as: Zofran Take 1 tablet (4 mg total) by mouth every 8 (eight) hours as needed for nausea or vomiting.   oxyCODONE-acetaminophen 10-325 MG tablet Commonly known as: Percocet Take 1  tablet by mouth every 6 (six) hours as needed for up to 5 days for pain.       Diagnostic Studies: DG Lumbar Spine 2-3 Views  Result Date: 05/02/2020 CLINICAL DATA:  L5-S1 TLIF EXAM: DG C-ARM 1-60 MIN; LUMBAR SPINE - 2-3 VIEW COMPARISON:  02/08/2009 lumbar spine radiographs FLUOROSCOPY TIME:  Fluoroscopy Time:  2 minutes 47 seconds Radiation Exposure Index (if provided by the fluoroscopic device): Sub 144 mGy Number of Acquired Spot Images: 2 FINDINGS: Nondiagnostic spot fluoroscopic intraoperative lateral and AP views  of the lumbosacral junction demonstrate bilateral pedicle screws at L5 and S1 with interbody spacer in the L5-S1 disc space. IMPRESSION: Intraoperative fluoroscopic guidance for L5-S1 fusion. Electronically Signed   By: Delbert Phenix M.D.   On: 05/02/2020 13:07   DG C-Arm 1-60 Min  Result Date: 05/02/2020 CLINICAL DATA:  L5-S1 TLIF EXAM: DG C-ARM 1-60 MIN; LUMBAR SPINE - 2-3 VIEW COMPARISON:  02/08/2009 lumbar spine radiographs FLUOROSCOPY TIME:  Fluoroscopy Time:  2 minutes 47 seconds Radiation Exposure Index (if provided by the fluoroscopic device): Sub 144 mGy Number of Acquired Spot Images: 2 FINDINGS: Nondiagnostic spot fluoroscopic intraoperative lateral and AP views of the lumbosacral junction demonstrate bilateral pedicle screws at L5 and S1 with interbody spacer in the L5-S1 disc space. IMPRESSION: Intraoperative fluoroscopic guidance for L5-S1 fusion. Electronically Signed   By: Delbert Phenix M.D.   On: 05/02/2020 13:07    Discharge Instructions    Incentive spirometry RT   Complete by: As directed        Follow-up Information    Venita Lick, MD. Schedule an appointment as soon as possible for a visit in 2 weeks.   Specialty: Orthopedic Surgery Why: For suture removal, For wound re-check, If symptoms worsen Contact information: [address] [place] [address] [phone]               Discharge Plan:  discharge to  home  Disposition: stable    Signed: Leonette Monarch Pranika Finks for [facility] PA-C [facility] [phone] 05/05/2020, 9:55 AM

## 2020-05-18 ENCOUNTER — Emergency Department (HOSPITAL_COMMUNITY): Payer: BC Managed Care – PPO

## 2020-05-18 ENCOUNTER — Other Ambulatory Visit: Payer: Self-pay

## 2020-05-18 ENCOUNTER — Inpatient Hospital Stay (HOSPITAL_COMMUNITY)
Admission: EM | Admit: 2020-05-18 | Discharge: 2020-05-23 | DRG: 314 | Disposition: A | Discharge status: Home / Self Care | Payer: BC Managed Care – PPO | Attending: Internal Medicine | Admitting: Internal Medicine

## 2020-05-18 ENCOUNTER — Encounter (HOSPITAL_COMMUNITY): Payer: Self-pay | Admitting: Emergency Medicine

## 2020-05-18 DIAGNOSIS — G8929 Other chronic pain: Secondary | ICD-10-CM | POA: Diagnosis present

## 2020-05-18 DIAGNOSIS — M5137 Other intervertebral disc degeneration, lumbosacral region: Secondary | ICD-10-CM | POA: Diagnosis not present

## 2020-05-18 DIAGNOSIS — Z79899 Other long term (current) drug therapy: Secondary | ICD-10-CM

## 2020-05-18 DIAGNOSIS — M25552 Pain in left hip: Secondary | ICD-10-CM | POA: Diagnosis present

## 2020-05-18 DIAGNOSIS — R609 Edema, unspecified: Secondary | ICD-10-CM | POA: Diagnosis not present

## 2020-05-18 DIAGNOSIS — F419 Anxiety disorder, unspecified: Secondary | ICD-10-CM | POA: Diagnosis present

## 2020-05-18 DIAGNOSIS — Z9109 Other allergy status, other than to drugs and biological substances: Secondary | ICD-10-CM

## 2020-05-18 DIAGNOSIS — Z91048 Other nonmedicinal substance allergy status: Secondary | ICD-10-CM

## 2020-05-18 DIAGNOSIS — Z83511 Family history of glaucoma: Secondary | ICD-10-CM | POA: Diagnosis not present

## 2020-05-18 DIAGNOSIS — E669 Obesity, unspecified: Secondary | ICD-10-CM | POA: Diagnosis not present

## 2020-05-18 DIAGNOSIS — R911 Solitary pulmonary nodule: Secondary | ICD-10-CM | POA: Diagnosis not present

## 2020-05-18 DIAGNOSIS — G8918 Other acute postprocedural pain: Secondary | ICD-10-CM | POA: Diagnosis not present

## 2020-05-18 DIAGNOSIS — M5459 Other low back pain: Secondary | ICD-10-CM | POA: Diagnosis present

## 2020-05-18 DIAGNOSIS — I10 Essential (primary) hypertension: Secondary | ICD-10-CM | POA: Diagnosis not present

## 2020-05-18 DIAGNOSIS — I2699 Other pulmonary embolism without acute cor pulmonale: Secondary | ICD-10-CM | POA: Diagnosis not present

## 2020-05-18 DIAGNOSIS — M7989 Other specified soft tissue disorders: Secondary | ICD-10-CM | POA: Diagnosis not present

## 2020-05-18 DIAGNOSIS — R000 Tachycardia, unspecified: Secondary | ICD-10-CM | POA: Diagnosis present

## 2020-05-18 DIAGNOSIS — R0602 Shortness of breath: Secondary | ICD-10-CM | POA: Diagnosis not present

## 2020-05-18 DIAGNOSIS — E871 Hypo-osmolality and hyponatremia: Secondary | ICD-10-CM | POA: Diagnosis not present

## 2020-05-18 DIAGNOSIS — E872 Acidosis, unspecified: Secondary | ICD-10-CM

## 2020-05-18 DIAGNOSIS — I9789 Other postprocedural complications and disorders of the circulatory system, not elsewhere classified: Secondary | ICD-10-CM | POA: Diagnosis not present

## 2020-05-18 DIAGNOSIS — Z833 Family history of diabetes mellitus: Secondary | ICD-10-CM | POA: Diagnosis not present

## 2020-05-18 DIAGNOSIS — Z6838 Body mass index (BMI) 38.0-38.9, adult: Secondary | ICD-10-CM | POA: Diagnosis not present

## 2020-05-18 DIAGNOSIS — M545 Low back pain, unspecified: Secondary | ICD-10-CM

## 2020-05-18 DIAGNOSIS — Z981 Arthrodesis status: Secondary | ICD-10-CM

## 2020-05-18 DIAGNOSIS — I82402 Acute embolism and thrombosis of unspecified deep veins of left lower extremity: Secondary | ICD-10-CM | POA: Diagnosis not present

## 2020-05-18 DIAGNOSIS — I82409 Acute embolism and thrombosis of unspecified deep veins of unspecified lower extremity: Secondary | ICD-10-CM | POA: Diagnosis present

## 2020-05-18 DIAGNOSIS — M79605 Pain in left leg: Secondary | ICD-10-CM | POA: Diagnosis not present

## 2020-05-18 DIAGNOSIS — Y838 Other surgical procedures as the cause of abnormal reaction of the patient, or of later complication, without mention of misadventure at the time of the procedure: Secondary | ICD-10-CM | POA: Diagnosis present

## 2020-05-18 DIAGNOSIS — Z20822 Contact with and (suspected) exposure to covid-19: Secondary | ICD-10-CM | POA: Diagnosis present

## 2020-05-18 DIAGNOSIS — S35516D Injury of unspecified iliac vein, subsequent encounter: Secondary | ICD-10-CM

## 2020-05-18 DIAGNOSIS — G4733 Obstructive sleep apnea (adult) (pediatric): Secondary | ICD-10-CM | POA: Diagnosis not present

## 2020-05-18 DIAGNOSIS — I82592 Chronic embolism and thrombosis of other specified deep vein of left lower extremity: Secondary | ICD-10-CM

## 2020-05-18 LAB — CBC WITH DIFFERENTIAL/PLATELET
Abs Immature Granulocytes: 0.06 10*3/uL (ref 0.00–0.07)
Basophils Absolute: 0.1 10*3/uL (ref 0.0–0.1)
Basophils Relative: 1 %
Eosinophils Absolute: 0.1 10*3/uL (ref 0.0–0.5)
Eosinophils Relative: 1 %
HCT: 41.5 % (ref 39.0–52.0)
Hemoglobin: 13 g/dL (ref 13.0–17.0)
Immature Granulocytes: 1 %
Lymphocytes Relative: 13 %
Lymphs Abs: 1.5 10*3/uL (ref 0.7–4.0)
MCH: 29 pg (ref 26.0–34.0)
MCHC: 31.3 g/dL (ref 30.0–36.0)
MCV: 92.4 fL (ref 80.0–100.0)
Monocytes Absolute: 0.6 10*3/uL (ref 0.1–1.0)
Monocytes Relative: 5 %
Neutro Abs: 9 10*3/uL — ABNORMAL HIGH (ref 1.7–7.7)
Neutrophils Relative %: 79 %
Platelets: 444 10*3/uL — ABNORMAL HIGH (ref 150–400)
RBC: 4.49 MIL/uL (ref 4.22–5.81)
RDW: 13.6 % (ref 11.5–15.5)
WBC: 11.3 10*3/uL — ABNORMAL HIGH (ref 4.0–10.5)
nRBC: 0 % (ref 0.0–0.2)

## 2020-05-18 LAB — URINALYSIS, ROUTINE W REFLEX MICROSCOPIC
Bilirubin Urine: NEGATIVE
Glucose, UA: NEGATIVE mg/dL
Hgb urine dipstick: NEGATIVE
Ketones, ur: NEGATIVE mg/dL
Leukocytes,Ua: NEGATIVE
Nitrite: NEGATIVE
Protein, ur: NEGATIVE mg/dL
Specific Gravity, Urine: 1.02 (ref 1.005–1.030)
pH: 5 (ref 5.0–8.0)

## 2020-05-18 LAB — HEPARIN LEVEL (UNFRACTIONATED): Heparin Unfractionated: 1.03 IU/mL — ABNORMAL HIGH (ref 0.30–0.70)

## 2020-05-18 LAB — COMPREHENSIVE METABOLIC PANEL
ALT: 30 U/L (ref 0–44)
AST: 27 U/L (ref 15–41)
Albumin: 3.9 g/dL (ref 3.5–5.0)
Alkaline Phosphatase: 81 U/L (ref 38–126)
Anion gap: 16 — ABNORMAL HIGH (ref 5–15)
BUN: 14 mg/dL (ref 6–20)
CO2: 21 mmol/L — ABNORMAL LOW (ref 22–32)
Calcium: 9.5 mg/dL (ref 8.9–10.3)
Chloride: 95 mmol/L — ABNORMAL LOW (ref 98–111)
Creatinine, Ser: 1.44 mg/dL — ABNORMAL HIGH (ref 0.61–1.24)
GFR, Estimated: 59 mL/min — ABNORMAL LOW (ref >60)
Glucose, Bld: 143 mg/dL — ABNORMAL HIGH (ref 70–99)
Potassium: 4 mmol/L (ref 3.5–5.1)
Sodium: 132 mmol/L — ABNORMAL LOW (ref 135–145)
Total Bilirubin: 0.8 mg/dL (ref 0.3–1.2)
Total Protein: 7.4 g/dL (ref 6.5–8.1)

## 2020-05-18 LAB — SARS CORONAVIRUS 2 BY RT PCR (HOSPITAL ORDER, PERFORMED IN ~~LOC~~ HOSPITAL LAB): SARS Coronavirus 2: NEGATIVE

## 2020-05-18 LAB — LACTIC ACID, PLASMA
Lactic Acid, Venous: 4.6 mmol/L (ref 0.5–1.9)
Lactic Acid, Venous: 2.8 mmol/L (ref 0.5–1.9)

## 2020-05-18 LAB — CK: Total CK: 198 U/L (ref 49–397)

## 2020-05-18 MED ORDER — MORPHINE SULFATE (PF) 4 MG/ML IV SOLN
6.0000 mg | Freq: Once | INTRAVENOUS | Status: AC
  Administered 2020-05-18: 6 mg via INTRAVENOUS
  Filled 2020-05-18: qty 2

## 2020-05-18 MED ORDER — SODIUM CHLORIDE 0.9 % IV BOLUS
500.0000 mL | Freq: Once | INTRAVENOUS | Status: AC
  Administered 2020-05-18: 500 mL via INTRAVENOUS

## 2020-05-18 MED ORDER — OXYCODONE-ACETAMINOPHEN 10-325 MG PO TABS: 1.0000 | ORAL_TABLET | ORAL | Status: DC | PRN

## 2020-05-18 MED ORDER — ACETAMINOPHEN 325 MG PO TABS: 650.0000 mg | ORAL_TABLET | Freq: Four times a day (QID) | ORAL | Status: DC | PRN

## 2020-05-18 MED ORDER — NEBIVOLOL HCL 5 MG PO TABS
5.0000 mg | ORAL_TABLET | Freq: Every day | ORAL | Status: DC
  Administered 2020-05-18 – 2020-05-23 (×6): 5 mg via ORAL
  Filled 2020-05-18 (×6): qty 1

## 2020-05-18 MED ORDER — HEPARIN BOLUS VIA INFUSION
6500.0000 [IU] | Freq: Once | INTRAVENOUS | Status: AC
  Administered 2020-05-18: 6500 [IU] via INTRAVENOUS
  Filled 2020-05-18: qty 6500

## 2020-05-18 MED ORDER — OXYCODONE-ACETAMINOPHEN 5-325 MG PO TABS
1.0000 | ORAL_TABLET | Freq: Once | ORAL | Status: AC
Start: 2020-05-18 — End: 2020-05-18
  Administered 2020-05-18: 1 via ORAL
  Filled 2020-05-18: qty 1

## 2020-05-18 MED ORDER — METHOCARBAMOL 500 MG PO TABS
500.0000 mg | ORAL_TABLET | Freq: Three times a day (TID) | ORAL | Status: DC | PRN
  Administered 2020-05-18 – 2020-05-23 (×11): 500 mg via ORAL
  Filled 2020-05-18 (×11): qty 1

## 2020-05-18 MED ORDER — HEPARIN (PORCINE) 25000 UT/250ML-% IV SOLN
1950.0000 [IU]/h | INTRAVENOUS | Status: AC
  Administered 2020-05-18: 1700 [IU]/h via INTRAVENOUS
  Administered 2020-05-18: 1500 [IU]/h via INTRAVENOUS
  Administered 2020-05-19 – 2020-05-20 (×3): 1600 [IU]/h via INTRAVENOUS
  Administered 2020-05-21: 1900 [IU]/h via INTRAVENOUS
  Administered 2020-05-21: 1950 [IU]/h via INTRAVENOUS
  Filled 2020-05-18 (×9): qty 250

## 2020-05-18 MED ORDER — IOHEXOL 350 MG/ML SOLN
100.0000 mL | Freq: Once | INTRAVENOUS | Status: AC | PRN
  Administered 2020-05-18: 75 mL via INTRAVENOUS

## 2020-05-18 MED ORDER — ONDANSETRON HCL 4 MG/2ML IJ SOLN: 4.0000 mg | Freq: Four times a day (QID) | INTRAMUSCULAR | Status: DC | PRN

## 2020-05-18 MED ORDER — ONDANSETRON HCL 4 MG PO TABS: 4.0000 mg | ORAL_TABLET | Freq: Four times a day (QID) | ORAL | Status: DC | PRN

## 2020-05-18 MED ORDER — GABAPENTIN 300 MG PO CAPS
300.0000 mg | ORAL_CAPSULE | Freq: Three times a day (TID) | ORAL | Status: DC
  Administered 2020-05-18 – 2020-05-23 (×15): 300 mg via ORAL
  Filled 2020-05-18 (×15): qty 1

## 2020-05-18 MED ORDER — OXYCODONE HCL 5 MG PO TABS: 5.0000 mg | ORAL_TABLET | ORAL | Status: DC | PRN

## 2020-05-18 MED ORDER — ESCITALOPRAM OXALATE 10 MG PO TABS: 5.0000 mg | ORAL_TABLET | Freq: Every day | ORAL | Status: DC

## 2020-05-18 MED ORDER — OXYCODONE HCL 5 MG PO TABS
10.0000 mg | ORAL_TABLET | ORAL | Status: DC | PRN
  Administered 2020-05-18 – 2020-05-21 (×15): 10 mg via ORAL
  Filled 2020-05-18 (×16): qty 2

## 2020-05-18 MED ORDER — ACETAMINOPHEN 325 MG PO TABS: 325.0000 mg | ORAL_TABLET | ORAL | Status: DC | PRN

## 2020-05-18 NOTE — ED Notes (Signed)
Patient transported to CT 

## 2020-05-18 NOTE — Progress Notes (Signed)
Bilateral  lower extremity venous study completed.   Spoke with RN to give results.   Please see CV Proc for preliminary results.   Axten Pascucci, RVT  

## 2020-05-18 NOTE — Progress Notes (Signed)
ANTICOAGULATION CONSULT NOTE - Initial Consult  Pharmacy Consult for heparin Indication: DVT and PE  No Known Allergies  Patient Measurements: Height: 5\' 11"  (180.3 cm) Weight: 125 kg (275 lb 9.2 oz) IBW/kg (Calculated) : 75.3 Heparin Dosing Weight: 103.4 kg  Vital Signs: Temp: 100.1 F (37.8 C) (02/04 1022) Temp Source: Rectal (02/04 1022) BP: 127/73 (02/04 0954) Pulse Rate: 103 (02/04 0954)  Labs: Recent Labs    05/18/20 0454 05/18/20 0814  HGB 13.0  --   HCT 41.5  --   PLT 444*  --   CREATININE 1.44*  --   CKTOTAL  --  198    Estimated Creatinine Clearance: 82.6 mL/min (A) (by C-G formula based on SCr of 1.44 mg/dL (H)).   Medical History: Past Medical History:  Diagnosis Date  . Anxiety   . Back pain   . Degeneration of lumbar intervertebral disc   . Hypertension   . Isthmic spondylolisthesis   . Joint pain   . Lumbar radiculopathy   . Numbness and tingling   . Sleep apnea    cpap @ [facility]    Assessment: 50 yo M found to have a pulmonary embolus in R lower lobe on CTA and an acute LLE DVT on doppler imaging. Pharmacy asked to start IV heparin. No AC noted PTA. CBC wnl.  Goal of Therapy:  Heparin level 0.3-0.7 units/ml Monitor platelets by anticoagulation protocol: Yes   Plan:  Give heparin 6500 unit IV bolus Start heparin infusion at 1700 units/hr Check 6-hr HL Monitor daily HL, CBC, s/sx bleeding  44, PharmD, BCPS PGY2 Cardiology Pharmacy Resident Phone: [phone] 05/18/2020  11:48 AM  Please check [email or web address] for unit-specific pharmacy phone numbers.

## 2020-05-18 NOTE — Progress Notes (Signed)
ANTICOAGULATION CONSULT NOTE - Initial Consult  Pharmacy Consult for heparin Indication: DVT and PE  Allergies  Allergen Reactions  . Tape Other (See Comments)    Fabric-like tape left red bumps on the skin after it was removed  . Other Other (See Comments)    Seasonal allergies- Itchy eyes, runny nose, etc..    Patient Measurements: Height: 5\' 11"  (180.3 cm) Weight: 125 kg (275 lb 9.2 oz) IBW/kg (Calculated) : 75.3 Heparin Dosing Weight: 103.4 kg  Vital Signs: Temp: 100.1 F (37.8 C) (02/04 1022) Temp Source: Rectal (02/04 1022) BP: 163/98 (02/04 1900) Pulse Rate: 82 (02/04 1900)  Labs: Recent Labs    05/18/20 0454 05/18/20 0814 05/18/20 1830  HGB 13.0  --   --   HCT 41.5  --   --   PLT 444*  --   --   HEPARINUNFRC  --   --  1.03*  CREATININE 1.44*  --   --   CKTOTAL  --  198  --     Estimated Creatinine Clearance: 82.6 mL/min (A) (by C-G formula based on SCr of 1.44 mg/dL (H)).   Medical History: Past Medical History:  Diagnosis Date  . Anxiety   . Back pain   . Degeneration of lumbar intervertebral disc   . Hypertension   . Isthmic spondylolisthesis   . Joint pain   . Lumbar radiculopathy   . Numbness and tingling   . Sleep apnea    cpap @ [facility]    Assessment: 50 yo M found to have a pulmonary embolus in R lower lobe on CTA and an acute LLE DVT on doppler imaging. Pharmacy asked to start IV heparin. No AC noted PTA. CBC wnl.  Initial heparin level supratherapeutic on 1700 units/hr, no bleeding observed and rate confirmed.    Goal of Therapy:  Heparin level 0.3-0.7 units/ml Monitor platelets by anticoagulation protocol: Yes   Plan:  Hold heparin gtt x 1h, then restart gtt at 1500 units/hr F/u 6 hour heparin level  44, PharmD Clinical Pharmacist ED Pharmacist Phone # [phone] 05/18/2020 8:43 PM

## 2020-05-18 NOTE — ED Triage Notes (Addendum)
Patient reports left lower back pain radiating down to his left leg with swelling onset last night , denies injury or fall . Back surgery last 05/02/20.

## 2020-05-18 NOTE — H&P (Signed)
History and Physical  Patient Name: Jason Moore     YIF:[id number]    DOB: December 09, 1970    DOA: 05/18/2020 PCP: [name], MD  Patient coming from: Home  Chief Complaint: Leg swelling      HPI: Jason Moore is a 50 y.o. M with hx HTN, OSA on CPAP and recent lumbar fusion who presents with acute left leg swelling.  Patient has chronic degenerative back disease, and was to have L5-S1 fusion last Dec, from an anterior approach, but suffered a tear in the left common iliac vein, which was repaired, and the fusion was aborted.  He did return on 1/19 and the lumbar spinal fusion was completed then without complication.  Since then, he has been home, recuperating well.  IN the last 24 hours, however, he started to notice swelling and discomfort in his left leg.  This progressed until it was severe and the thigh was tense and he came tot he ER.  In the ER, he had HR 136, and swelling in the left thigh without loss of distal pulse.  US of the left leg showed extensive left DVT.  CTA chest showed a small right sided pulmonary embolism.  Lactic acid elevated but no signs or symptoms to suggest infection.          ROS: Review of Systems  Constitutional: Negative for chills, fever and malaise/fatigue.  Respiratory: Negative for cough, hemoptysis, sputum production, shortness of breath and wheezing.   Cardiovascular: Positive for chest pain and leg swelling. Negative for palpitations, orthopnea, claudication and PND.  Musculoskeletal: Positive for back pain and myalgias.  All other systems reviewed and are negative.         Past Medical History:  Diagnosis Date  . Anxiety   . Back pain   . Degeneration of lumbar intervertebral disc   . Hypertension   . Isthmic spondylolisthesis   . Joint pain   . Lumbar radiculopathy   . Numbness and tingling   . Sleep apnea    cpap @ [facility]    Past Surgical History:  Procedure Laterality Date  . ABDOMINAL EXPOSURE N/A 03/21/2020    Procedure: ABDOMINAL EXPOSURE;  Surgeon: Larina Earthly, MD;  Location: [facility] OR;  Service: Vascular;  Laterality: N/A;  . ANTERIOR LUMBAR FUSION N/A 03/21/2020   Procedure: ATTEMPTED ANTERIOR LUMBAR FUSION (ALIF) LUMBAR FIVE- SACRAL ONE, POSTERIOR SPINAL FUSION INTERBODY LUMBAR FIVE-SACRAL ONE;  Surgeon: Venita Lick, MD;  Location: [facility] OR;  Service: Orthopedics;  Laterality: N/A;  5 HRS Dr. Arbie Cookey to do approach Tap block with exparel  . SHOULDER ARTHROSCOPY  04/15/2007  . TRANSFORAMINAL LUMBAR INTERBODY FUSION (TLIF) WITH PEDICLE SCREW FIXATION 1 LEVEL N/A 05/02/2020   Procedure: TRANSFORAMINAL LUMBAR INTERBODY FUSION (TLIF) LUMBAR 5-SACRAL 1;  Surgeon: Venita Lick, MD;  Location: [facility] OR;  Service: Orthopedics;  Laterality: N/A;  4.5 hrs    Social History: Patient lives at home with his wife.  Works in [employer].  The patient walks with a walker since his surgery, prior to that unassisted.  Nonsmoker.  Allergies  Allergen Reactions  . Tape Other (See Comments)    Fabric-like tape left red bumps on the skin after it was removed  . Other Other (See Comments)    Seasonal allergies- Itchy eyes, runny nose, etc..    Family history: family history includes Diabetes in his mother; Glaucoma in his mother.  Prior to Admission medications   Medication Sig Start Date End Date Taking? Authorizing Provider  gabapentin (  NEURONTIN) 300 MG capsule Take 300 mg by mouth 3 (three) times daily.   Yes [provider]  methocarbamol (ROBAXIN) 500 MG tablet Take 500 mg by mouth 3 (three) times daily.   Yes [provider]  nebivolol (BYSTOLIC) 5 MG tablet Take 5 mg by mouth daily.   Yes [provider]  ondansetron (ZOFRAN) 4 MG tablet Take 1 tablet (4 mg total) by mouth every 8 (eight) hours as needed for nausea or vomiting. 05/02/20  Yes Venita Lick, MD  oxyCODONE-acetaminophen (PERCOCET) 10-325 MG tablet Take 1 tablet by mouth 3 (three) times daily.   Yes [provider]   escitalopram (LEXAPRO) 5 MG tablet Take 5 mg by mouth daily.    [provider]       Physical Exam: BP 132/77   Pulse 98   Temp 100.1 F (37.8 C) (Rectal)   Resp 17   Ht 5\' 11"  (1.803 m)   Wt 125 kg   SpO2 97%   BMI 38.43 kg/m  General appearance: Well-developed, adult male, alert and in no acute distress.   Eyes: Anicteric, conjunctiva pink, lids and lashes normal. PERRL.    ENT: No nasal deformity, discharge, epistaxis.  Hearing normal. OP moist without lesions.  Lips and dentition normal Skin: Warm and dry.  No jaundice.  No suspicious rashes or lesions. Cardiac: Tachycardic, regular, nl S1-S2, no murmurs appreciated.  Capillary refill is brisk.  JVP normal.  Marked painful LLE edema, normal on right.  Radial and DP pulses 2+ and symmetric. Respiratory: Normal respiratory rate and rhythm.  CTAB without rales or wheezes. Abdomen: Abdomen soft.  No TTP or gaurding. No ascites, distension, hepatosplenomegaly.   MSK: No deformities or effusions of the large joints of the upper or lower extremities bilaterally.  No cyanosis or clubbing.  The left leg is swollen, the thigh is somewhat tender.  The left foot is not pale and has good movement and cap refill. Neuro: Cranial nerves normal.  Sensation intact to light touch. Speech is fluent.  Muscle strength 5/5 and symmetric.    Psych: Sensorium intact and responding to questions, attention normal.  Behavior appropriate.  Affect normal.  Judgment and insight appear normal.     Labs on Admission:  I have personally reviewed following labs and imaging studies: CBC: Recent Labs  Lab 05/18/20 0454  WBC 11.3*  NEUTROABS 9.0*  HGB 13.0  HCT 41.5  MCV 92.4  PLT 444*   Basic Metabolic Panel: Recent Labs  Lab 05/18/20 0454  NA 132*  K 4.0  CL 95*  CO2 21*  GLUCOSE 143*  BUN 14  CREATININE 1.44*  CALCIUM 9.5   GFR: Estimated Creatinine Clearance: 82.6 mL/min (A) (by C-G formula based on SCr of 1.44 mg/dL  (H)).  Liver Function Tests: Recent Labs  Lab 05/18/20 0454  AST 27  ALT 30  ALKPHOS 81  BILITOT 0.8  PROT 7.4  ALBUMIN 3.9   No results for input(s): LIPASE, AMYLASE in the last 168 hours. No results for input(s): AMMONIA in the last 168 hours. Coagulation Profile: No results for input(s): INR, PROTIME in the last 168 hours. Cardiac Enzymes: Recent Labs  Lab 05/18/20 0814  CKTOTAL 198   BNP (last 3 results) No results for input(s): PROBNP in the last 8760 hours. HbA1C: No results for input(s): HGBA1C in the last 72 hours. CBG: No results for input(s): GLUCAP in the last 168 hours. Lipid Profile: No results for input(s): CHOL, HDL, LDLCALC, TRIG, CHOLHDL,  LDLDIRECT in the last 72 hours. Thyroid Function Tests: No results for input(s): TSH, T4TOTAL, FREET4, T3FREE, THYROIDAB in the last 72 hours. Anemia Panel: No results for input(s): VITAMINB12, FOLATE, FERRITIN, TIBC, IRON, RETICCTPCT in the last 72 hours. Sepsis Labs:   Recent Results (from the past 240 hour(s))  SARS Coronavirus 2 by RT PCR (hospital order, performed in [facility] hospital lab) Nasopharyngeal Nasopharyngeal Swab     Status: None   Collection Time: 05/18/20  8:50 AM   Specimen: Nasopharyngeal Swab  Result Value Ref Range Status   SARS Coronavirus 2 NEGATIVE NEGATIVE Final    Comment: (NOTE) SARS-CoV-2 target nucleic acids are NOT DETECTED.  The SARS-CoV-2 RNA is generally detectable in upper and lower respiratory specimens during the acute phase of infection. The lowest concentration of SARS-CoV-2 viral copies this assay can detect is 250 copies / mL. A negative result does not preclude SARS-CoV-2 infection and should not be used as the sole basis for treatment or other patient management decisions.  A negative result may occur with improper specimen collection / handling, submission of specimen other than nasopharyngeal swab, presence of viral mutation(s) within the areas targeted by this  assay, and inadequate number of viral copies (<250 copies / mL). A negative result must be combined with clinical observations, patient history, and epidemiological information.  Fact Sheet for Patients:   BoilerBrush.com.cy  Fact Sheet for Healthcare Providers: https://pope.com/  This test is not yet approved or  cleared by the [place] FDA and has been authorized for detection and/or diagnosis of SARS-CoV-2 by FDA under an Emergency Use Authorization (EUA).  This EUA will remain in effect (meaning this test can be used) for the duration of the COVID-19 declaration under Section 564(b)(1) of the Act, 21 U.S.C. section 360bbb-3(b)(1), unless the authorization is terminated or revoked sooner.  Performed at [facility], [address], [place], [place] [address]            Radiological Exams on Admission: Personally reviewed CTA report shows right PE; CXR personally reviewed, shows no acute disease: CT Angio Chest PE W and/or Wo Contrast  Result Date: 05/18/2020 CLINICAL DATA:  Tachycardia. Lumbar surgery a week ago. High probability DVT in the left lower extremity. PE suspected. EXAM: CT ANGIOGRAPHY CHEST WITH CONTRAST TECHNIQUE: Multidetector CT imaging of the chest was performed using the standard protocol during bolus administration of intravenous contrast. Multiplanar CT image reconstructions and MIPs were obtained to evaluate the vascular anatomy. CONTRAST:  69mL OMNIPAQUE IOHEXOL 350 MG/ML SOLN COMPARISON:  None. FINDINGS: Cardiovascular: The thoracic aorta is normal in caliber without atherosclerosis or dissection. The heart size is normal. No coronary artery calcifications are identified. There is respiratory motion with stairstep artifact in the bases limiting evaluation for more peripheral branches. Within these limitations, there are no definite emboli seen on the left. There is a pulmonary embolus in the medial right  lower lobe on axial images 174 through 178. No other definitive emboli are seen on the right. No central emboli are seen on the right or left. Mediastinum/Nodes: No enlarged mediastinal, hilar, or axillary lymph nodes. Thyroid gland, trachea, and esophagus demonstrate no significant findings. Lungs/Pleura: A small nodule on the right on series 7, image 48 is in close proximity to the fissure, possibly but not definitely an intra fissural node measuring up to 5 mm. No other nodules or masses. No focal infiltrates. The central airways are normal. No pneumothorax. Upper Abdomen: No acute abnormality. Musculoskeletal: No chest wall  abnormality. No acute or significant osseous findings. Review of the MIP images confirms the above findings. IMPRESSION: 1. Single pulmonary embolus in the right lower lobe. 2. 5 mm nodule in the right lung. No follow-up needed if patient is low-risk. Non-contrast chest CT can be considered in 12 months if patient is high-risk. This recommendation follows the consensus statement: Guidelines for Management of Incidental Pulmonary Nodules Detected on CT Images: From the Fleischner Society 2017; Radiology 2017; 284:228-243. Findings were called to Dr. Blane Ohara in the emergency room. Electronically Signed   By: Gerome Sam III M.D   On: 05/18/2020 11:08   DG Chest Portable 1 View  Result Date: 05/18/2020 CLINICAL DATA:  Shortness of breath.  Left low back pain. EXAM: PORTABLE CHEST 1 VIEW COMPARISON:  None. FINDINGS: Heart size is normal. No edema or effusion is present. No focal airspace disease is evident. IMPRESSION: Negative one view chest x-ray. Electronically Signed   By: Marin Roberts M.D.   On: 05/18/2020 09:36   VAS [place] LOWER EXTREMITY VENOUS (DVT) (ONLY [facility] & [facility] 7a-7p)  Result Date: 05/18/2020  Lower Venous DVT Study Indications: Pain, and Edema.  Risk Factors: Surgery Back. Comparison Study: No previous Exam Performing Technologist: Clint Guy RVT  Examination  Guidelines: A complete evaluation includes B-mode imaging, spectral Doppler, color Doppler, and power Doppler as needed of all accessible portions of each vessel. Bilateral testing is considered an integral part of a complete examination. Limited examinations for reoccurring indications may be performed as noted. The reflux portion of the exam is performed with the patient in reverse Trendelenburg.  +-----+---------------+---------+-----------+----------+--------------+ RIGHTCompressibilityPhasicitySpontaneityPropertiesThrombus Aging +-----+---------------+---------+-----------+----------+--------------+ CFV  Full           Yes      Yes                                 +-----+---------------+---------+-----------+----------+--------------+ SFJ  Full                                                        +-----+---------------+---------+-----------+----------+--------------+   +---------+---------------+---------+-----------+----------+--------------+ LEFT     CompressibilityPhasicitySpontaneityPropertiesThrombus Aging +---------+---------------+---------+-----------+----------+--------------+ CFV      None           No       No                   Acute          +---------+---------------+---------+-----------+----------+--------------+ SFJ      None                                         Acute          +---------+---------------+---------+-----------+----------+--------------+ FV Prox  None                                         Acute          +---------+---------------+---------+-----------+----------+--------------+ FV Mid   None  Acute          +---------+---------------+---------+-----------+----------+--------------+ FV DistalNone                                         Acute          +---------+---------------+---------+-----------+----------+--------------+ PFV      None                                          Acute          +---------+---------------+---------+-----------+----------+--------------+ POP      None           No       No                   Acute          +---------+---------------+---------+-----------+----------+--------------+ PTV      None                                         Acute          +---------+---------------+---------+-----------+----------+--------------+ PERO     None                                         Acute          +---------+---------------+---------+-----------+----------+--------------+  Summary: RIGHT: - No evidence of common femoral vein obstruction.  LEFT: - Findings consistent with acute deep vein thrombosis involving the left external iliac vein, common femoral vein, SF junction, left femoral vein, left proximal profunda vein, left popliteal vein, left posterior tibial veins, and left peroneal veins.   *See table(s) above for measurements and observations.    Preliminary           Assessment/Plan   Acute pulmonary embolism without cor pulmonale Acute DVT, extensive thrombus   -Start heparin until Vascular surgery plans for intervention or not are clear  -Consult Vascular surgery for extensive LLE DVT in setting of recent LEFT iliac vein repair      Degenerative disk disease  and recent lumbar spinal fusion -Continue PRN percocet, Robaxin, and gabapentin  Hypertension -Continue nebivolol  OSA  -Continue CPAP at night  Elevated lactic acid  Clearing with IV fluids.  The patient is noted to have a lactate>4. With the current information available to me, I don't think the patient is in septic shock. The lactate>4, is likely related to his extensive DVT.  Obesity BMI 38     DVT prophylaxis: N/A, on treatment heparin  Code Status: ULL  Family Communication: Wife at bedside  Disposition Plan: Anticipate IV heparin, IV fluids and Vascular surgery consultation.  Given recent iliac repair, percutaneous thrombectomy is  obviously high risk, but is being considered carefully by vascular surgery.  Consults called: Vascular Surgery, Orthopedics Admission status: INAPTIENT   At the time of admission, it appears that the appropriate admission status for this patient is INPATIENT. This is judged to be reasonable and necessary in order to provide the required intensity of service to ensure the patient's safety given: -presenting symptoms of leg pain, swelling -physical exam findings of swelling,  tachycardia, and  -initial radiographic and laboratory data pulmonary embolism, extensive left leg DVT -in the context of their chronic comorbidities HTN    Together, these circumstances are felt to place him at high risk for further clinical deterioration threatening life, limb, or organ requiring a high intensity of service due to this acute illness that poses a threat to life, limb or bodily function.  I certify that at the point of admission it is my clinical judgment that the patient will require inpatient hospital care spanning beyond 2 midnights from the point of admission and that early discharge would result in unnecessary risk of decompensation and readmission or threat to life, limb or bodily function.   Medical decision making: Patient seen at 11:00 AM on 05/18/2020.  The patient was discussed with Dr. Jodi MourningZavitz.  What exists of the patient's chart was reviewed in depth and summarized above.  Clinical condition: heart rate imrpoving, BP stable.        Earl Liteshristopher P Tilman [facility] Please page though AMION or Epic secure chat:  For password, contact charge nurse

## 2020-05-18 NOTE — ED Provider Notes (Signed)
[facility] EMERGENCY DEPARTMENT Provider Note   CSN: [id number] Arrival date & time: 05/18/20  8938     History Chief Complaint  Patient presents with  . Back pain / Leg Pain     Jason Moore is a 50 y.o. male.  Patient with history of degenerative disc disease, high blood pressure presents with severe pain and swelling to the left thigh and leg with mild radiation of back pain down the leg.  Patient had surgery by orthopedics Dr. Shon Baton on January 19 with overall went well.  Patient denies any shortness of breath or fevers.  He denies any injuries or falls.  Symptoms started last night.  Patient not on any blood thinners no history of blood clot.        Past Medical History:  Diagnosis Date  . Anxiety   . Back pain   . Degeneration of lumbar intervertebral disc   . Hypertension   . Isthmic spondylolisthesis   . Joint pain   . Lumbar radiculopathy   . Numbness and tingling   . Sleep apnea    cpap @ [facility]    Patient Active Problem List   Diagnosis Date Noted  . Fusion of lumbar spine 05/02/2020  . Degenerative disc disease at L5-S1 level 03/21/2020  . Hypertension 02/24/2020  . BPH with obstruction/lower urinary tract symptoms 05/12/2018  . Erectile dysfunction due to arterial insufficiency 05/12/2018  . Family history of prostate cancer in father 05/12/2018  . Constipation 12/14/2013  . Incomplete emptying of bladder 12/14/2013  . Organic impotence 12/14/2013  . Thrombosed external hemorrhoid 12/14/2013    Past Surgical History:  Procedure Laterality Date  . ABDOMINAL EXPOSURE N/A 03/21/2020   Procedure: ABDOMINAL EXPOSURE;  Surgeon: Larina Earthly, MD;  Location: [facility] OR;  Service: Vascular;  Laterality: N/A;  . ANTERIOR LUMBAR FUSION N/A 03/21/2020   Procedure: ATTEMPTED ANTERIOR LUMBAR FUSION (ALIF) LUMBAR FIVE- SACRAL ONE, POSTERIOR SPINAL FUSION INTERBODY LUMBAR FIVE-SACRAL ONE;  Surgeon: Venita Lick, MD;  Location: [facility] OR;  Service:  Orthopedics;  Laterality: N/A;  5 HRS Dr. Arbie Cookey to do approach Tap block with exparel  . SHOULDER ARTHROSCOPY  04/15/2007  . TRANSFORAMINAL LUMBAR INTERBODY FUSION (TLIF) WITH PEDICLE SCREW FIXATION 1 LEVEL N/A 05/02/2020   Procedure: TRANSFORAMINAL LUMBAR INTERBODY FUSION (TLIF) LUMBAR 5-SACRAL 1;  Surgeon: Venita Lick, MD;  Location: [facility] OR;  Service: Orthopedics;  Laterality: N/A;  4.5 hrs       No family history on file.  Social History   Tobacco Use  . Smoking status: Never Smoker  . Smokeless tobacco: Never Used  Vaping Use  . Vaping Use: Never used  Substance Use Topics  . Alcohol use: Yes    Comment: Occasionally  . Drug use: Never    Home Medications Prior to Admission medications   Medication Sig Start Date End Date Taking? Authorizing Provider  BYSTOLIC 5 MG tablet Take 5 mg by mouth daily. 01/01/20   [provider]  gabapentin (NEURONTIN) 300 MG capsule Take 300 mg by mouth daily as needed (pain).    [provider]  ondansetron (ZOFRAN) 4 MG tablet Take 1 tablet (4 mg total) by mouth every 8 (eight) hours as needed for nausea or vomiting. 05/02/20   Venita Lick, MD    Allergies    Patient has no known allergies.  Review of Systems   Review of Systems  Constitutional: Negative for chills and fever.  HENT: Negative for congestion.   Eyes: Negative  for visual disturbance.  Respiratory: Negative for shortness of breath.   Cardiovascular: Positive for leg swelling. Negative for chest pain.  Gastrointestinal: Negative for abdominal pain and vomiting.  Genitourinary: Negative for dysuria and flank pain.  Musculoskeletal: Positive for back pain. Negative for neck pain and neck stiffness.  Skin: Negative for rash.  Neurological: Negative for weakness, light-headedness and headaches.    Physical Exam Updated Vital Signs BP 127/73   Pulse (!) 103   Temp 100.1 F (37.8 C) (Rectal)   Resp 20   Ht 5\' 11"  (1.803 m)   Wt 125 kg   SpO2 96%    BMI 38.43 kg/m   Physical Exam Vitals and nursing note reviewed.  Constitutional:      Appearance: He is well-developed and well-nourished.  HENT:     Head: Normocephalic and atraumatic.  Eyes:     General:        Right eye: No discharge.        Left eye: No discharge.     Conjunctiva/sclera: Conjunctivae normal.  Neck:     Trachea: No tracheal deviation.  Cardiovascular:     Rate and Rhythm: Regular rhythm. Tachycardia present.     Pulses: Normal pulses.  Pulmonary:     Effort: Pulmonary effort is normal.     Breath sounds: Normal breath sounds.  Abdominal:     General: There is no distension.     Palpations: Abdomen is soft.     Tenderness: There is no abdominal tenderness. There is no guarding.  Musculoskeletal:        General: Swelling and tenderness present. No signs of injury.     Cervical back: Normal range of motion and neck supple.     Left lower leg: Edema present.  Skin:    General: Skin is warm.     Findings: No rash.     Comments: Patient has significant edema circumferential thigh and leg on the left, 2+ distal pulses, pain with any range of motion of that leg and thigh and joints.  Mild diffuse warmth but no external sign of infection.  Neurological:     General: No focal deficit present.     Mental Status: He is alert and oriented to person, place, and time.  Psychiatric:        Mood and Affect: Mood and affect normal.     Comments: Patient uncomfortable     ED Results / Procedures / Treatments   Labs (all labs ordered are listed, but only abnormal results are displayed) Labs Reviewed  CBC WITH DIFFERENTIAL/PLATELET - Abnormal; Notable for the following components:      Result Value   WBC 11.3 (*)    Platelets 444 (*)    Neutro Abs 9.0 (*)    All other components within normal limits  COMPREHENSIVE METABOLIC PANEL - Abnormal; Notable for the following components:   Sodium 132 (*)    Chloride 95 (*)    CO2 21 (*)    Glucose, Bld 143 (*)     Creatinine, Ser 1.44 (*)    GFR, Estimated 59 (*)    Anion gap 16 (*)    All other components within normal limits  LACTIC ACID, PLASMA - Abnormal; Notable for the following components:   Lactic Acid, Venous 4.6 (*)    All other components within normal limits  SARS CORONAVIRUS 2 BY RT PCR (HOSPITAL ORDER, PERFORMED IN [facility])  CULTURE, BLOOD (ROUTINE X 2)  CULTURE, BLOOD (ROUTINE  X 2)  URINALYSIS, ROUTINE W REFLEX MICROSCOPIC  CK  LACTIC ACID, PLASMA    EKG None  Radiology CT Angio Chest PE W and/or Wo Contrast  Result Date: 05/18/2020 CLINICAL DATA:  Tachycardia. Lumbar surgery a week ago. High probability DVT in the left lower extremity. PE suspected. EXAM: CT ANGIOGRAPHY CHEST WITH CONTRAST TECHNIQUE: Multidetector CT imaging of the chest was performed using the standard protocol during bolus administration of intravenous contrast. Multiplanar CT image reconstructions and MIPs were obtained to evaluate the vascular anatomy. CONTRAST:  7mL OMNIPAQUE IOHEXOL 350 MG/ML SOLN COMPARISON:  None. FINDINGS: Cardiovascular: The thoracic aorta is normal in caliber without atherosclerosis or dissection. The heart size is normal. No coronary artery calcifications are identified. There is respiratory motion with stairstep artifact in the bases limiting evaluation for more peripheral branches. Within these limitations, there are no definite emboli seen on the left. There is a pulmonary embolus in the medial right lower lobe on axial images 174 through 178. No other definitive emboli are seen on the right. No central emboli are seen on the right or left. Mediastinum/Nodes: No enlarged mediastinal, hilar, or axillary lymph nodes. Thyroid gland, trachea, and esophagus demonstrate no significant findings. Lungs/Pleura: A small nodule on the right on series 7, image 48 is in close proximity to the fissure, possibly but not definitely an intra fissural node measuring up to 5 mm. No other  nodules or masses. No focal infiltrates. The central airways are normal. No pneumothorax. Upper Abdomen: No acute abnormality. Musculoskeletal: No chest wall abnormality. No acute or significant osseous findings. Review of the MIP images confirms the above findings. IMPRESSION: 1. Single pulmonary embolus in the right lower lobe. 2. 5 mm nodule in the right lung. No follow-up needed if patient is low-risk. Non-contrast chest CT can be considered in 12 months if patient is high-risk. This recommendation follows the consensus statement: Guidelines for Management of Incidental Pulmonary Nodules Detected on CT Images: From the Fleischner Society 2017; Radiology 2017; 284:228-243. Findings were called to Dr. Blane Ohara in the emergency room. Electronically Signed   By: Gerome Sam III M.D   On: 05/18/2020 11:08   DG Chest Portable 1 View  Result Date: 05/18/2020 CLINICAL DATA:  Shortness of breath.  Left low back pain. EXAM: PORTABLE CHEST 1 VIEW COMPARISON:  None. FINDINGS: Heart size is normal. No edema or effusion is present. No focal airspace disease is evident. IMPRESSION: Negative one view chest x-ray. Electronically Signed   By: Marin Roberts M.D.   On: 05/18/2020 09:36   VAS [place] LOWER EXTREMITY VENOUS (DVT) (ONLY [facility] & [facility] 7a-7p)  Result Date: 05/18/2020  Lower Venous DVT Study Indications: Pain, and Edema.  Risk Factors: Surgery Back. Comparison Study: No previous Exam Performing Technologist: Clint Guy RVT  Examination Guidelines: A complete evaluation includes B-mode imaging, spectral Doppler, color Doppler, and power Doppler as needed of all accessible portions of each vessel. Bilateral testing is considered an integral part of a complete examination. Limited examinations for reoccurring indications may be performed as noted. The reflux portion of the exam is performed with the patient in reverse Trendelenburg.  +-----+---------------+---------+-----------+----------+--------------+  RIGHTCompressibilityPhasicitySpontaneityPropertiesThrombus Aging +-----+---------------+---------+-----------+----------+--------------+ CFV  Full           Yes      Yes                                 +-----+---------------+---------+-----------+----------+--------------+ SFJ  Full                                                        +-----+---------------+---------+-----------+----------+--------------+   +---------+---------------+---------+-----------+----------+--------------+  LEFT     CompressibilityPhasicitySpontaneityPropertiesThrombus Aging +---------+---------------+---------+-----------+----------+--------------+ CFV      None           No       No                   Acute          +---------+---------------+---------+-----------+----------+--------------+ SFJ      None                                         Acute          +---------+---------------+---------+-----------+----------+--------------+ FV Prox  None                                         Acute          +---------+---------------+---------+-----------+----------+--------------+ FV Mid   None                                         Acute          +---------+---------------+---------+-----------+----------+--------------+ FV DistalNone                                         Acute          +---------+---------------+---------+-----------+----------+--------------+ PFV      None                                         Acute          +---------+---------------+---------+-----------+----------+--------------+ POP      None           No       No                   Acute          +---------+---------------+---------+-----------+----------+--------------+ PTV      None                                         Acute          +---------+---------------+---------+-----------+----------+--------------+ PERO     None                                         Acute           +---------+---------------+---------+-----------+----------+--------------+  Summary: RIGHT: - No evidence of common femoral vein obstruction.  LEFT: - Findings consistent with acute deep vein thrombosis involving the left external iliac vein, common femoral vein, SF junction, left femoral vein, left proximal profunda vein, left popliteal vein, left posterior tibial veins, and left peroneal veins.   *See table(s) above for measurements and observations.    Preliminary     Procedures .Critical Care Performed by: Blane OharaZavitz, Rosetta Rupnow, MD Authorized by: Blane OharaZavitz, Janijah Symons, MD   Critical care provider statement:  Critical care time (minutes):  80   Critical care start time:  05/18/2020 9:00 AM   Critical care end time:  05/18/2020 10:20 AM   Critical care time was exclusive of:  Separately billable procedures and treating other patients and teaching time   Critical care was time spent personally by me on the following activities:  Discussions with consultants, evaluation of patient's response to treatment, examination of patient, ordering and performing treatments and interventions, ordering and review of laboratory studies, ordering and review of radiographic studies, pulse oximetry, re-evaluation of patient's condition, obtaining history from patient or surrogate and review of old charts Comments:     Vascular concerns     Medications Ordered in ED Medications  oxyCODONE-acetaminophen (PERCOCET/ROXICET) 5-325 MG per tablet 1 tablet (1 tablet Oral Given 05/18/20 0454)  morphine 4 MG/ML injection 6 mg (6 mg Intravenous Given 05/18/20 0823)  sodium chloride 0.9 % bolus 500 mL (0 mLs Intravenous Stopped 05/18/20 0955)  sodium chloride 0.9 % bolus 500 mL (500 mLs Intravenous New Bag/Given 05/18/20 0955)  sodium chloride 0.9 % bolus 500 mL (500 mLs Intravenous New Bag/Given 05/18/20 1102)  iohexol (OMNIPAQUE) 350 MG/ML injection 100 mL (75 mLs Intravenous Contrast Given 05/18/20 1050)  morphine 4 MG/ML injection 6 mg  (6 mg Intravenous Given 05/18/20 1110)    ED Course  I have reviewed the triage vital signs and the nursing notes.  Pertinent labs & imaging results that were available during my care of the patient were reviewed by me and considered in my medical decision making (see chart for details).    MDM Rules/Calculators/A&P                          Patient recent postop for lumbosacral fusion presents with worsening leg pain leg edema with clinical concern for significant DVT.  Patient with significant tachycardia on arrival 130s clinical concern for pain related however also pulmonary embolism on the differential with concern for DVT.  Plan for ultrasound of the lower extremity and CT scan to look for clot burden in the lung as well.  Patient's blood pressure normal on multiple checks.  Afebrile requested rectal temperature for completeness with nursing. Lactic acid returned elevated likely multifactorial from significant clot burden, no obvious signs of infection.  No external signs of infection on back exam.  Pain meds given and pain improved along with heart rate.  IV fluid bolus given. Discussed with orthopedics in consult for possible admission and evaluation of postop and decision to start heparin.  Discussed with vascular surgery for consult.  Discussed with vascular surgeon in the emergency room regarding the case, complicated case with 2 recent surgeries and patient with iliac injury.  No plan for emergent vascular procedure however recommends heparin if surgery agrees.  Awaiting orthopedic recommendations repaged Ortho.  Repeat pain meds given.  CT scan discussed with radiologist showing peripheral acute pulmonary embolism without heart strain.   MAKAYLA CONFER was evaluated in Emergency Department on 05/18/2020 for the symptoms described in the history of present illness. He was evaluated in the context of the global COVID-19 pandemic, which necessitated consideration that the patient might  be at risk for infection with the SARS-CoV-2 virus that causes COVID-19. Institutional protocols and algorithms that pertain to the evaluation of patients at risk for COVID-19 are in a state of rapid change based on information released by regulatory bodies including the CDC and federal and state organizations. These policies and  algorithms were followed during the patient's care in the ED.  Final Clinical Impression(s) / ED Diagnoses Final diagnoses:  Acute deep vein thrombosis (DVT) of left lower extremity, unspecified vein (HCC)  Lactic acidosis  Post-operative pain  Hyponatremia  Other acute pulmonary embolism, unspecified whether acute cor pulmonale present [facility])    Rx / DC Orders ED Discharge Orders    None       Blane Ohara, MD 05/18/20 1112

## 2020-05-18 NOTE — Consult Note (Signed)
Hospital Consult    Reason for Consult:  Extensive left leg DVT Referring Physician:  ED MRN #:  [id number]  History of Present Illness: This is a 50 y.o. male with history of DDD with chronic back pain and HTN that vascular surgery has been consulted for extensive left leg DVT.  Patient seen in the ED and states he developed extensive left leg swelling and pain that started acutely early this morning.  He denies any previous history of DVT or other thromboembolic events.  Left leg venous duplex showed extensive DVT from the external iliac vein down to the common femoral vein and profunda vein and left superficial femoral vein, popliteal vein, and tibial veins.  He had an attempted anterior lumbar interbody fusion at L5-S1 on 03/21/2020 complicated by iliac vein injury at the caval bifurcation that was repaired by Dr. Arbie Cookey.  Most recently on 05/02/2020 he underwent a transforaminal lumbar interbody fusion at L5-S1 by Dr. Shon Baton on 05/02/2020.  Past Medical History:  Diagnosis Date  . Anxiety   . Back pain   . Degeneration of lumbar intervertebral disc   . Hypertension   . Isthmic spondylolisthesis   . Joint pain   . Lumbar radiculopathy   . Numbness and tingling   . Sleep apnea    cpap @ [facility]    Past Surgical History:  Procedure Laterality Date  . ABDOMINAL EXPOSURE N/A 03/21/2020   Procedure: ABDOMINAL EXPOSURE;  Surgeon: Larina Earthly, MD;  Location: [facility] OR;  Service: Vascular;  Laterality: N/A;  . ANTERIOR LUMBAR FUSION N/A 03/21/2020   Procedure: ATTEMPTED ANTERIOR LUMBAR FUSION (ALIF) LUMBAR FIVE- SACRAL ONE, POSTERIOR SPINAL FUSION INTERBODY LUMBAR FIVE-SACRAL ONE;  Surgeon: Venita Lick, MD;  Location: [facility] OR;  Service: Orthopedics;  Laterality: N/A;  5 HRS Dr. Arbie Cookey to do approach Tap block with exparel  . SHOULDER ARTHROSCOPY  04/15/2007  . TRANSFORAMINAL LUMBAR INTERBODY FUSION (TLIF) WITH PEDICLE SCREW FIXATION 1 LEVEL N/A 05/02/2020   Procedure: TRANSFORAMINAL LUMBAR  INTERBODY FUSION (TLIF) LUMBAR 5-SACRAL 1;  Surgeon: Venita Lick, MD;  Location: [facility] OR;  Service: Orthopedics;  Laterality: N/A;  4.5 hrs    No Known Allergies  Prior to Admission medications   Medication Sig Start Date End Date Taking? Authorizing Provider  BYSTOLIC 5 MG tablet Take 5 mg by mouth daily. 01/01/20   [provider]  gabapentin (NEURONTIN) 300 MG capsule Take 300 mg by mouth daily as needed (pain).    [provider]  ondansetron (ZOFRAN) 4 MG tablet Take 1 tablet (4 mg total) by mouth every 8 (eight) hours as needed for nausea or vomiting. 05/02/20   Venita Lick, MD    Social History   Socioeconomic History  . Marital status: Married    Spouse name: Not on file  . Number of children: Not on file  . Years of education: Not on file  . Highest education level: Not on file  Occupational History  . Occupation: Parts Salesperson  Tobacco Use  . Smoking status: Never Smoker  . Smokeless tobacco: Never Used  Vaping Use  . Vaping Use: Never used  Substance and Sexual Activity  . Alcohol use: Yes    Comment: Occasionally  . Drug use: Never  . Sexual activity: Not on file  Other Topics Concern  . Not on file  Social History Narrative  . Not on file   Social Determinants of Health   Financial Resource Strain: Not on file  Food Insecurity: Not on file  Transportation Needs: Not on file  Physical Activity: Not on file  Stress: Not on file  Social Connections: Not on file  Intimate Partner Violence: Not on file     No family history on file.  ROS: [x]  Positive   [ ]  Negative   [ ]  All sytems reviewed and are negative  Cardiovascular: []  chest pain/pressure []  palpitations []  SOB lying flat []  DOE []  pain in legs while walking []  pain in legs at rest []  pain in legs at night []  non-healing ulcers []  hx of DVT [x]  swelling in legs - left  Pulmonary: []  productive cough []  asthma/wheezing []  home O2  Neurologic: []  weakness in  []  arms []  legs []  numbness in []  arms []  legs []  hx of CVA []  mini stroke [] difficulty speaking or slurred speech []  temporary loss of vision in one eye []  dizziness  Hematologic: []  hx of cancer []  bleeding problems []  problems with blood clotting easily  Endocrine:   []  diabetes []  thyroid disease  GI []  vomiting blood []  blood in stool  GU: []  CKD/renal failure []  HD--[]  M/W/F or []  T/T/S []  burning with urination []  blood in urine  Psychiatric: []  anxiety []  depression  Musculoskeletal: []  arthritis []  joint pain  Integumentary: []  rashes []  ulcers  Constitutional: []  fever []  chills   Physical Examination  Vitals:   05/18/20 0954 05/18/20 1022  BP: 127/73   Pulse: (!) 103   Resp: 20   Temp:  100.1 F (37.8 C)  SpO2: 96%    Body mass index is 38.43 kg/m.  General:  NAD Gait: Not observed HENT: WNL, normocephalic Pulmonary: no respiratory distress Cardiac: regular, without  Murmurs, rubs or gallops Abdomen:soft, NT/ND Vascular Exam/Pulses: Extensive left leg swelling up to thigh, left leg 2x size right leg, left DP 2+ palpable and foot warm Extremities: without ischemic changes, without Gangrene , without cellulitis; without open wounds;  Musculoskeletal: no muscle wasting or atrophy  Neurologic: A&O X 3; Appropriate Affect ; SENSATION: normal; MOTOR FUNCTION:  moving all extremities equally. Speech is fluent/normal   CBC    Component Value Date/Time   WBC 11.3 (H) 05/18/2020 0454   RBC 4.49 05/18/2020 0454   HGB 13.0 05/18/2020 0454   HCT 41.5 05/18/2020 0454   PLT 444 (H) 05/18/2020 0454   MCV 92.4 05/18/2020 0454   MCH 29.0 05/18/2020 0454   MCHC 31.3 05/18/2020 0454   RDW 13.6 05/18/2020 0454   LYMPHSABS 1.5 05/18/2020 0454   MONOABS 0.6 05/18/2020 0454   EOSABS 0.1 05/18/2020 0454   BASOSABS 0.1 05/18/2020 0454    BMET    Component Value Date/Time   NA 132 (L) 05/18/2020 0454   K 4.0 05/18/2020 0454   CL 95 (L)  05/18/2020 0454   CO2 21 (L) 05/18/2020 0454   GLUCOSE 143 (H) 05/18/2020 0454   BUN 14 05/18/2020 0454   CREATININE 1.44 (H) 05/18/2020 0454   CALCIUM 9.5 05/18/2020 0454   GFRNONAA 59 (L) 05/18/2020 0454    COAGS: Lab Results  Component Value Date   INR 1.1 04/30/2020   INR 1.1 03/19/2020     Non-Invasive Vascular Imaging:    Left leg venous duplex shows extensive acute DVT from the left external iliac vein into the common femoral vein, profunda vein, superficial femoral vein, popliteal vein and tibial veins   ASSESSMENT/PLAN: This is a 50 y.o. male with extensive left leg DVT including iliac vein involvement following a recent attempt at anterior lumbar  interbody fusion at L5-S1 where he sustained a iliac vein injury at the caval bifurcation that was primarily repaired on 03/21/20 by Dr. Arbie Cookey.  He most recently underwent transforaminal lumbar interbody fusion at L5-S1 by Dr. Shon Baton on 05/02/2020.  I do not think he has any signs of phlegmasia at this time given he does have a palpable dorsalis pedis pulse on exam although his left leg is at least 2x the size of the right leg.  I think he needs to be started on systemic anticoagulation with IV heparin if this is okay with spine surgery and keep the leg elevated.  We will follow him as an inpatient to see how he improves on heparin.  Any attempt at percutaneous mechanical thrombectomy likely high risk given his recent iliac vein injury at the caval bifurcation but will further discuss with Dr. Rosalita Levan, MD [facility] Office: [phone]  Cephus Shelling

## 2020-05-18 NOTE — Consult Note (Signed)
Patient ID: Jason Moore MRN: [id number] DOB/AGE: [birth year] 50 y.o.  Admit date: 05/18/2020  Admission Diagnoses:  Principal Problem:   Acute pulmonary embolism (HCC) Active Problems:   Hypertension   Degenerative disc disease at L5-S1 level   DVT (deep venous thrombosis) (HCC)   OSA (obstructive sleep apnea)   HPI: Jason Moore is a pleasant 50 year old male with past medical history of attempted anterior lumbar interbody fusion at L5-S1 on 03/21/2020, this was complicated and aborted due to an iliac vein injury which was repaired by Dr. Arbie CookeyEarly.  Later, on 05/02/2020 he did have a successful transforaminal lumbar interbody fusion by Dr. Shon BatonBrooks. He was doing well until he woke up this morning at around 2 AM with significant left leg pain and swelling and decided to go to the emergency department. He states he was unable to bear weight on his left lower extremity or bend his knee due to significant swelling. Patient was not on any anticoagulation.  He was using TED stockings and ambulating. In the emergency department he was found to have an extensive left leg clot.  Vascular has already been consulted.  Orthopedics was also asked to consult.  Past Medical History: Past Medical History:  Diagnosis Date  . Anxiety   . Back pain   . Degeneration of lumbar intervertebral disc   . Hypertension   . Isthmic spondylolisthesis   . Joint pain   . Lumbar radiculopathy   . Numbness and tingling   . Sleep apnea    cpap @ [facility]    Surgical History: Past Surgical History:  Procedure Laterality Date  . ABDOMINAL EXPOSURE N/A 03/21/2020   Procedure: ABDOMINAL EXPOSURE;  Surgeon: Larina EarthlyEarly, Todd F, MD;  [facility];  Service: Vascular;  Laterality: N/A;  . ANTERIOR LUMBAR FUSION N/A 03/21/2020   Procedure: ATTEMPTED ANTERIOR LUMBAR FUSION (ALIF) LUMBAR FIVE- SACRAL ONE, POSTERIOR SPINAL FUSION INTERBODY LUMBAR FIVE-SACRAL ONE;  Surgeon: Venita LickBrooks, Dahari, MD;  Location: [facility] OR;  Service:  Orthopedics;  Laterality: N/A;  5 HRS Dr. Arbie CookeyEarly to do approach Tap block with exparel  . SHOULDER ARTHROSCOPY  04/15/2007  . TRANSFORAMINAL LUMBAR INTERBODY FUSION (TLIF) WITH PEDICLE SCREW FIXATION 1 LEVEL N/A 05/02/2020   Procedure: TRANSFORAMINAL LUMBAR INTERBODY FUSION (TLIF) LUMBAR 5-SACRAL 1;  Surgeon: Venita LickBrooks, Dahari, MD;  Location: [facility] OR;  Service: Orthopedics;  Laterality: N/A;  4.5 hrs    Family History: Family History  Problem Relation Age of Onset  . Glaucoma Mother   . Diabetes Mother     Social History: Social History   Socioeconomic History  . Marital status: Married    Spouse name: Not on file  . Number of children: Not on file  . Years of education: Not on file  . Highest education level: Not on file  Occupational History  . Occupation: Parts Salesperson  Tobacco Use  . Smoking status: Never Smoker  . Smokeless tobacco: Never Used  Vaping Use  . Vaping Use: Never used  Substance and Sexual Activity  . Alcohol use: Yes    Comment: Occasionally  . Drug use: Never  . Sexual activity: Not on file  Other Topics Concern  . Not on file  Social History Narrative  . Not on file   Social Determinants of Health   Financial Resource Strain: Not on file  Food Insecurity: Not on file  Transportation Needs: Not on file  Physical Activity: Not on file  Stress: Not on file  Social Connections: Not on file  Intimate Partner Violence: Not on file    Allergies: Tape and Other  Medications: I have reviewed the patient's current medications.  Vital Signs: Patient Vitals for the past 24 hrs:  BP Temp Temp src Pulse Resp SpO2 Height Weight  05/18/20 1100 132/77 -- -- 98 17 97 % -- --  05/18/20 1022 -- 100.1 F (37.8 C) Rectal -- -- -- -- --  05/18/20 0954 127/73 -- -- (!) 103 20 96 % -- --  05/18/20 0845 132/85 -- -- (!) 111 18 99 % -- --  05/18/20 0800 (!) 152/88 99.7 F (37.6 C) Oral (!) 118 17 100 % -- --  05/18/20 0751 -- -- -- -- -- 100 % -- --   05/18/20 0722 (!) 145/95 98.6 F (37 C) Oral (!) 120 20 100 % -- --  05/18/20 0450 -- -- -- -- -- -- 5\' 11"  (1.803 m) 125 kg  05/18/20 0446 (!) 118/92 97.9 F (36.6 C) Oral (!) 136 18 100 % -- --    Radiology: DG Lumbar Spine 2-3 Views  Result Date: 05/02/2020 CLINICAL DATA:  L5-S1 TLIF EXAM: DG C-ARM 1-60 MIN; LUMBAR SPINE - 2-3 VIEW COMPARISON:  02/08/2009 lumbar spine radiographs FLUOROSCOPY TIME:  Fluoroscopy Time:  2 minutes 47 seconds Radiation Exposure Index (if provided by the fluoroscopic device): Sub 144 mGy Number of Acquired Spot Images: 2 FINDINGS: Nondiagnostic spot fluoroscopic intraoperative lateral and AP views of the lumbosacral junction demonstrate bilateral pedicle screws at L5 and S1 with interbody spacer in the L5-S1 disc space. IMPRESSION: Intraoperative fluoroscopic guidance for L5-S1 fusion. Electronically Signed   By: Delbert Phenix M.D.   On: 05/02/2020 13:07   CT Angio Chest PE W and/or Wo Contrast  Result Date: 05/18/2020 CLINICAL DATA:  Tachycardia. Lumbar surgery a week ago. High probability DVT in the left lower extremity. PE suspected. EXAM: CT ANGIOGRAPHY CHEST WITH CONTRAST TECHNIQUE: Multidetector CT imaging of the chest was performed using the standard protocol during bolus administration of intravenous contrast. Multiplanar CT image reconstructions and MIPs were obtained to evaluate the vascular anatomy. CONTRAST:  55mL OMNIPAQUE IOHEXOL 350 MG/ML SOLN COMPARISON:  None. FINDINGS: Cardiovascular: The thoracic aorta is normal in caliber without atherosclerosis or dissection. The heart size is normal. No coronary artery calcifications are identified. There is respiratory motion with stairstep artifact in the bases limiting evaluation for more peripheral branches. Within these limitations, there are no definite emboli seen on the left. There is a pulmonary embolus in the medial right lower lobe on axial images 174 through 178. No other definitive emboli are seen on the  right. No central emboli are seen on the right or left. Mediastinum/Nodes: No enlarged mediastinal, hilar, or axillary lymph nodes. Thyroid gland, trachea, and esophagus demonstrate no significant findings. Lungs/Pleura: A small nodule on the right on series 7, image 48 is in close proximity to the fissure, possibly but not definitely an intra fissural node measuring up to 5 mm. No other nodules or masses. No focal infiltrates. The central airways are normal. No pneumothorax. Upper Abdomen: No acute abnormality. Musculoskeletal: No chest wall abnormality. No acute or significant osseous findings. Review of the MIP images confirms the above findings. IMPRESSION: 1. Single pulmonary embolus in the right lower lobe. 2. 5 mm nodule in the right lung. No follow-up needed if patient is low-risk. Non-contrast chest CT can be considered in 12 months if patient is high-risk. This recommendation follows the consensus statement: Guidelines for Management of Incidental Pulmonary Nodules Detected on CT  Images: From the Fleischner Society 2017; Radiology 2017; [phone]. Findings were called to Dr. Blane Ohara in the emergency room. Electronically Signed   By: Gerome Sam III M.D   On: 05/18/2020 11:08   DG Chest Portable 1 View  Result Date: 05/18/2020 CLINICAL DATA:  Shortness of breath.  Left low back pain. EXAM: PORTABLE CHEST 1 VIEW COMPARISON:  None. FINDINGS: Heart size is normal. No edema or effusion is present. No focal airspace disease is evident. IMPRESSION: Negative one view chest x-ray. Electronically Signed   By: Marin Roberts M.D.   On: 05/18/2020 09:36   DG C-Arm 1-60 Min  Result Date: 05/02/2020 CLINICAL DATA:  L5-S1 TLIF EXAM: DG C-ARM 1-60 MIN; LUMBAR SPINE - 2-3 VIEW COMPARISON:  02/08/2009 lumbar spine radiographs FLUOROSCOPY TIME:  Fluoroscopy Time:  2 minutes 47 seconds Radiation Exposure Index (if provided by the fluoroscopic device): Sub 144 mGy Number of Acquired Spot Images: 2  FINDINGS: Nondiagnostic spot fluoroscopic intraoperative lateral and AP views of the lumbosacral junction demonstrate bilateral pedicle screws at L5 and S1 with interbody spacer in the L5-S1 disc space. IMPRESSION: Intraoperative fluoroscopic guidance for L5-S1 fusion. Electronically Signed   By: Delbert Phenix M.D.   On: 05/02/2020 13:07   VAS [place] LOWER EXTREMITY VENOUS (DVT) (ONLY [facility] 7a-7p)  Result Date: 05/18/2020  Lower Venous DVT Study Indications: Pain, and Edema.  Risk Factors: Surgery Back. Comparison Study: No previous Exam Performing Technologist: Clint Guy RVT  Examination Guidelines: A complete evaluation includes B-mode imaging, spectral Doppler, color Doppler, and power Doppler as needed of all accessible portions of each vessel. Bilateral testing is considered an integral part of a complete examination. Limited examinations for reoccurring indications may be performed as noted. The reflux portion of the exam is performed with the patient in reverse Trendelenburg.  +-----+---------------+---------+-----------+----------+--------------+ RIGHTCompressibilityPhasicitySpontaneityPropertiesThrombus Aging +-----+---------------+---------+-----------+----------+--------------+ CFV  Full           Yes      Yes                                 +-----+---------------+---------+-----------+----------+--------------+ SFJ  Full                                                        +-----+---------------+---------+-----------+----------+--------------+   +---------+---------------+---------+-----------+----------+--------------+ LEFT     CompressibilityPhasicitySpontaneityPropertiesThrombus Aging +---------+---------------+---------+-----------+----------+--------------+ CFV      None           No       No                   Acute          +---------+---------------+---------+-----------+----------+--------------+ SFJ      None                                          Acute          +---------+---------------+---------+-----------+----------+--------------+ FV Prox  None                                         Acute          +---------+---------------+---------+-----------+----------+--------------+  FV Mid   None                                         Acute          +---------+---------------+---------+-----------+----------+--------------+ FV DistalNone                                         Acute          +---------+---------------+---------+-----------+----------+--------------+ PFV      None                                         Acute          +---------+---------------+---------+-----------+----------+--------------+ POP      None           No       No                   Acute          +---------+---------------+---------+-----------+----------+--------------+ PTV      None                                         Acute          +---------+---------------+---------+-----------+----------+--------------+ PERO     None                                         Acute          +---------+---------------+---------+-----------+----------+--------------+  Summary: RIGHT: - No evidence of common femoral vein obstruction.  LEFT: - Findings consistent with acute deep vein thrombosis involving the left external iliac vein, common femoral vein, SF junction, left femoral vein, left proximal profunda vein, left popliteal vein, left posterior tibial veins, and left peroneal veins.   *See table(s) above for measurements and observations.    Preliminary     Labs: Recent Labs    05/18/20 0454  WBC 11.3*  RBC 4.49  HCT 41.5  PLT 444*   Recent Labs    05/18/20 0454  NA 132*  K 4.0  CL 95*  CO2 21*  BUN 14  CREATININE 1.44*  GLUCOSE 143*  CALCIUM 9.5   No results for input(s): LABPT, INR in the last 72 hours.  Review of Systems: Denies any systemic features such as fever/sweats/chills Positive left lower extremity  swelling and weakness No loss of bladder or bowel control.  Physical Exam: Body mass index is 38.43 kg/m.  General: Alert and oriented 3, no apparent distress Pulmonary: No respiratory distress Abdomen: Nondistended, nontender Inspection: Left lower extremity extremely swollen, no significant discoloration or open lesions. Musculoskeletal: Severe left lower extremity pain and swelling.  Knee and ankle range of motion on the left is decreased due to significant swelling, patient is able to perform resisted plantarflexion and dorsiflexion. Lower extremity sensation intact bilaterally Peripheral vascular: Feet are warm and well perfused. Palpable dorsal pedal pulses. Park refill intact bilaterally bilaterally. Lumbar spine incision: Well-healed.  A few remaining Steri-Strips which were left to  fall off on her own. No obvious hematoma, no signs of infection, no dehiscence  Assessment and Plan: Blanton Kardell is a pleasant 50 year old male who presented to the ED with left lower extremity swelling and pain approximately 2-1/2 weeks after transforaminal lumbar interbody fusion on 05/02/2020 by Dr. Shon Baton.  In the ED, he was found to have extensive left lower extremity blood clot which I do think is his primary symptom generator.  Also positive for PE.  Elevated lactate suspected to be due to clot burden. Physical exam and presentation not concerning for recurrent lumbar radiculopathy or other spine complication.   Appreciate vascular assistance. Agree with heparin. from a spine standpoint.  I did discuss this with Dr. Shon Baton.  Plan will be to admit the patient to medicine for monitoring and pain control.  We will monitor his progress with the heparin.  [facility] Daphna Lafuente PA-C [facility]

## 2020-05-19 DIAGNOSIS — G4733 Obstructive sleep apnea (adult) (pediatric): Secondary | ICD-10-CM

## 2020-05-19 DIAGNOSIS — I10 Essential (primary) hypertension: Secondary | ICD-10-CM

## 2020-05-19 DIAGNOSIS — I2699 Other pulmonary embolism without acute cor pulmonale: Secondary | ICD-10-CM

## 2020-05-19 LAB — BASIC METABOLIC PANEL
Anion gap: 11 (ref 5–15)
BUN: 12 mg/dL (ref 6–20)
CO2: 21 mmol/L — ABNORMAL LOW (ref 22–32)
Calcium: 8.9 mg/dL (ref 8.9–10.3)
Chloride: 100 mmol/L (ref 98–111)
Creatinine, Ser: 1.22 mg/dL (ref 0.61–1.24)
GFR, Estimated: >60 mL/min (ref >60)
Glucose, Bld: 115 mg/dL — ABNORMAL HIGH (ref 70–99)
Potassium: 4.2 mmol/L (ref 3.5–5.1)
Sodium: 132 mmol/L — ABNORMAL LOW (ref 135–145)

## 2020-05-19 LAB — CBC
HCT: 37.6 % — ABNORMAL LOW (ref 39.0–52.0)
Hemoglobin: 12.6 g/dL — ABNORMAL LOW (ref 13.0–17.0)
MCH: 30.3 pg (ref 26.0–34.0)
MCHC: 33.5 g/dL (ref 30.0–36.0)
MCV: 90.4 fL (ref 80.0–100.0)
Platelets: 313 10*3/uL (ref 150–400)
RBC: 4.16 MIL/uL — ABNORMAL LOW (ref 4.22–5.81)
RDW: 14 % (ref 11.5–15.5)
WBC: 9.2 10*3/uL (ref 4.0–10.5)
nRBC: 0 % (ref 0.0–0.2)

## 2020-05-19 LAB — HEPARIN LEVEL (UNFRACTIONATED)
Heparin Unfractionated: 0.39 IU/mL (ref 0.30–0.70)
Heparin Unfractionated: 0.26 IU/mL — ABNORMAL LOW (ref 0.30–0.70)
Heparin Unfractionated: 0.36 IU/mL (ref 0.30–0.70)

## 2020-05-19 LAB — HIV ANTIBODY (ROUTINE TESTING W REFLEX): HIV Screen 4th Generation wRfx: NONREACTIVE

## 2020-05-19 MED ORDER — SENNOSIDES-DOCUSATE SODIUM 8.6-50 MG PO TABS
1.0000 | ORAL_TABLET | Freq: Two times a day (BID) | ORAL | Status: DC
  Administered 2020-05-19 – 2020-05-23 (×6): 1 via ORAL
  Filled 2020-05-19 (×7): qty 1

## 2020-05-19 MED ORDER — POLYETHYLENE GLYCOL 3350 17 G PO PACK
17.0000 g | PACK | Freq: Every day | ORAL | Status: DC
  Administered 2020-05-20: 17 g via ORAL
  Filled 2020-05-19: qty 1

## 2020-05-19 NOTE — Progress Notes (Signed)
PROGRESS NOTE  Jason Moore DVV:[id number] DOB: 1970-10-22 DOA: 05/18/2020 PCP: [name], MD  HPI/Recap of past 24 hours: HPI from Dr Tawni Levy is a 50 y.o. M with hx HTN, OSA on CPAP and recent lumbar fusion who presents with acute left leg swelling. Patient has chronic degenerative back disease, and was to have L5-S1 fusion last Dec, from an anterior approach, but suffered a tear in the left common iliac vein, which was repaired, and the fusion was aborted. On 1/19 pt had lumbar spinal fusion surgery which was completed then without complication. For the past day or so, pt started noticing worsening swelling and discomfort in his left leg, therefore presented to the ED. In the ER, he had HR 136, and swelling in the left thigh without loss of distal pulse.  US of the left leg showed extensive left DVT.  CTA chest showed a small right sided pulmonary embolism.  Lactic acid elevated but no signs or symptoms to suggest infection.  Patient admitted for further management.    Today, patient continues to complain of LLE pain and still with persistent swelling.  Denies any worsening shortness of breath, chest pain, abdominal pain, nausea/vomiting, fever/chills.    Assessment/Plan: Principal Problem:   Acute pulmonary embolism (HCC) Active Problems:   Hypertension   Degenerative disc disease at L5-S1 level   DVT (deep venous thrombosis) (HCC)   OSA (obstructive sleep apnea)   Acute extensive LLE DVT Acute pulmonary embolism Likely provoked s/p surgery Vascular surgeon on board, continue IV heparin for now due to recent left iliac vein repair Continue IV Heparin-pharmacy to dose  Elevated lactic acidosis Unlikely due to sepsis/infection Possibly due to above Will trend  Hypertension Continue nebivolol  Recent lumbar spinal fusion Continue pain management  OSA Continue CPAP at night  Obesity Lifestyle modification advised       Malnutrition Type:       Malnutrition Characteristics:      Nutrition Interventions:       Estimated body mass index is 38.43 kg/m as calculated from the following:   Height as of this encounter: 5\' 11"  (1.803 m).   Weight as of this encounter: 125 kg.     Code Status: Full  Family Communication: Discussed with wife at bedside  Disposition Plan: Status is: Inpatient  Remains inpatient appropriate because:Inpatient level of care appropriate due to severity of illness   Dispo: The patient is from: Home              Anticipated d/c is to: Home              Anticipated d/c date is: 2 days              Patient currently is not medically stable to d/c.   Difficult to place patient No   Consultants:  Vascular surgery  Orthopedic surgery  Procedures:  None  Antimicrobials:  None  DVT prophylaxis: IV Heparin   Objective: Vitals:   05/19/20 0108 05/19/20 0335 05/19/20 0516 05/19/20 1440  BP: 132/66 119/78 121/77 129/82  Pulse: 80 81 78 79  Resp: 15 20 19 16   Temp: 99.1 F (37.3 C) 98.6 F (37 C) 98.9 F (37.2 C) 98.2 F (36.8 C)  TempSrc: Oral Oral Oral   SpO2: 98% 98% 99% 97%  Weight:      Height:        Intake/Output Summary (Last 24 hours) at 05/19/2020 1831 Last data filed at 05/19/2020 1500 Gross  per 24 hour  Intake 451.66 ml  Output --  Net 451.66 ml   Filed Weights   05/18/20 0450  Weight: 125 kg    Exam:  General: NAD   Cardiovascular: S1, S2 present  Respiratory: CTAB  Abdomen: Soft, nontender, nondistended, bowel sounds present  Musculoskeletal: 2+ LLE edema with tenderness, pulses present, able to move his foot  Skin: Normal  Psychiatry: Normal mood    Data Reviewed: CBC: Recent Labs  Lab 05/18/20 0454 05/19/20 0619  WBC 11.3* 9.2  NEUTROABS 9.0*  --   HGB 13.0 12.6*  HCT 41.5 37.6*  MCV 92.4 90.4  PLT 444* 313   Basic Metabolic Panel: Recent Labs  Lab 05/18/20 0454 05/19/20 0619  NA 132* 132*  K 4.0 4.2  CL 95* 100  CO2  21* 21*  GLUCOSE 143* 115*  BUN 14 12  CREATININE 1.44* 1.22  CALCIUM 9.5 8.9   GFR: Estimated Creatinine Clearance: 97.5 mL/min (by C-G formula based on SCr of 1.22 mg/dL). Liver Function Tests: Recent Labs  Lab 05/18/20 0454  AST 27  ALT 30  ALKPHOS 81  BILITOT 0.8  PROT 7.4  ALBUMIN 3.9   No results for input(s): LIPASE, AMYLASE in the last 168 hours. No results for input(s): AMMONIA in the last 168 hours. Coagulation Profile: No results for input(s): INR, PROTIME in the last 168 hours. Cardiac Enzymes: Recent Labs  Lab 05/18/20 0814  CKTOTAL 198   BNP (last 3 results) No results for input(s): PROBNP in the last 8760 hours. HbA1C: No results for input(s): HGBA1C in the last 72 hours. CBG: No results for input(s): GLUCAP in the last 168 hours. Lipid Profile: No results for input(s): CHOL, HDL, LDLCALC, TRIG, CHOLHDL, LDLDIRECT in the last 72 hours. Thyroid Function Tests: No results for input(s): TSH, T4TOTAL, FREET4, T3FREE, THYROIDAB in the last 72 hours. Anemia Panel: No results for input(s): VITAMINB12, FOLATE, FERRITIN, TIBC, IRON, RETICCTPCT in the last 72 hours. Urine analysis:    Component Value Date/Time   COLORURINE YELLOW 05/18/2020 0450   APPEARANCEUR CLEAR 05/18/2020 0450   LABSPEC 1.020 05/18/2020 0450   PHURINE 5.0 05/18/2020 0450   GLUCOSEU NEGATIVE 05/18/2020 0450   HGBUR NEGATIVE 05/18/2020 0450   BILIRUBINUR NEGATIVE 05/18/2020 0450   KETONESUR NEGATIVE 05/18/2020 0450   PROTEINUR NEGATIVE 05/18/2020 0450   NITRITE NEGATIVE 05/18/2020 0450   LEUKOCYTESUR NEGATIVE 05/18/2020 0450   Sepsis Labs: @LABRCNTIP (procalcitonin:4,lacticidven:4)  ) Recent Results (from the past 240 hour(s))  SARS Coronavirus 2 by RT PCR (hospital order, performed in [facility] hospital lab) Nasopharyngeal Nasopharyngeal Swab     Status: None   Collection Time: 05/18/20  8:50 AM   Specimen: Nasopharyngeal Swab  Result Value Ref Range Status   SARS Coronavirus  2 NEGATIVE NEGATIVE Final    Comment: (NOTE) SARS-CoV-2 target nucleic acids are NOT DETECTED.  The SARS-CoV-2 RNA is generally detectable in upper and lower respiratory specimens during the acute phase of infection. The lowest concentration of SARS-CoV-2 viral copies this assay can detect is 250 copies / mL. A negative result does not preclude SARS-CoV-2 infection and should not be used as the sole basis for treatment or other patient management decisions.  A negative result may occur with improper specimen collection / handling, submission of specimen other than nasopharyngeal swab, presence of viral mutation(s) within the areas targeted by this assay, and inadequate number of viral copies (<250 copies / mL). A negative result must be combined with clinical observations, patient history, and  epidemiological information.  Fact Sheet for Patients:   BoilerBrush.com.cy  Fact Sheet for Healthcare Providers: https://pope.com/  This test is not yet approved or  cleared by the [place] FDA and has been authorized for detection and/or diagnosis of SARS-CoV-2 by FDA under an Emergency Use Authorization (EUA).  This EUA will remain in effect (meaning this test can be used) for the duration of the COVID-19 declaration under Section 564(b)(1) of the Act, 21 U.S.C. section 360bbb-3(b)(1), unless the authorization is terminated or revoked sooner.  Performed at [facility], [address], [place], [place] [address]   Blood culture (routine x 2)     Status: None (Preliminary result)   Collection Time: 05/18/20  1:10 PM   Specimen: BLOOD  Result Value Ref Range Status   Specimen Description BLOOD SITE NOT SPECIFIED  Final   Special Requests   Final    BOTTLES DRAWN AEROBIC AND ANAEROBIC Blood Culture adequate volume   Culture   Final    NO GROWTH 1 DAY Performed at [facility], [address] [place], [place] [address]     Report Status PENDING  Incomplete  Blood culture (routine x 2)     Status: None (Preliminary result)   Collection Time: 05/18/20  6:30 PM   Specimen: BLOOD RIGHT ARM  Result Value Ref Range Status   Specimen Description BLOOD RIGHT ARM  Final   Special Requests   Final    BOTTLES DRAWN AEROBIC AND ANAEROBIC Blood Culture adequate volume   Culture   Final    NO GROWTH < 24 HOURS Performed at [facility], [address], [place], [place] [address]    Report Status PENDING  Incomplete      Studies: No results found.  Scheduled Meds: . gabapentin  300 mg Oral TID  . nebivolol  5 mg Oral Daily    Continuous Infusions: . heparin 1,600 Units/hr (05/19/20 1316)     LOS: 1 day     Briant Cedar, MD [facility]  If 7PM-7AM, please contact night-coverage [email or web address] 05/19/2020, 6:31 PM

## 2020-05-19 NOTE — Plan of Care (Signed)

## 2020-05-19 NOTE — Progress Notes (Signed)
ANTICOAGULATION CONSULT NOTE  Pharmacy Consult for heparin Indication: DVT and PE  Allergies  Allergen Reactions  . Tape Other (See Comments)    Fabric-like tape left red bumps on the skin after it was removed  . Other Other (See Comments)    Seasonal allergies- Itchy eyes, runny nose, etc..    Patient Measurements: Height: 5\' 11"  (180.3 cm) Weight: 125 kg (275 lb 9.2 oz) IBW/kg (Calculated) : 75.3 Heparin Dosing Weight: 103.4 kg  Vital Signs: Temp: 99.1 F (37.3 C) (02/05 2021) Temp Source: Oral (02/05 2021) BP: 125/88 (02/05 2021) Pulse Rate: 84 (02/05 2021)  Labs: Recent Labs    05/18/20 0454 05/18/20 0814 05/18/20 1830 05/19/20 0619 05/19/20 1233 05/19/20 2010  HGB 13.0  --   --  12.6*  --   --   HCT 41.5  --   --  37.6*  --   --   PLT 444*  --   --  313  --   --   HEPARINUNFRC  --   --    < > 0.39 0.26* 0.36  CREATININE 1.44*  --   --  1.22  --   --   CKTOTAL  --  198  --   --   --   --    < > = values in this interval not displayed.    Estimated Creatinine Clearance: 97.5 mL/min (by C-G formula based on SCr of 1.22 mg/dL).   Medical History: Past Medical History:  Diagnosis Date  . Anxiety   . Back pain   . Degeneration of lumbar intervertebral disc   . Hypertension   . Isthmic spondylolisthesis   . Joint pain   . Lumbar radiculopathy   . Numbness and tingling   . Sleep apnea    cpap @ [facility]    Assessment: 50 yo M found to have a pulmonary embolus in R lower lobe on CTA and an acute LLE DVT on doppler imaging. Pharmacy asked to start IV heparin. No AC noted PTA. CBC wnl.  Repeat heparin level this evening is therapeutic (0.36) on 1600 units/hr. No bleeding or issue with line infusing noted. CBC remains wnl.   Goal of Therapy:  Heparin level 0.3-0.7 units/ml Monitor platelets by anticoagulation protocol: Yes   Plan:  Continue heparin gtt at 1600 units/hr Daily HL and CBC while on heparin Monitor for S/sx of bleeding   44 PharmD.,  BCPS Clinical Pharmacist 05/19/2020 8:52 PM

## 2020-05-19 NOTE — Progress Notes (Signed)
[facility]  Subjective  -still significant swelling to the left leg reveals range of motion ankle is somewhat improved.   Objective 121/77 78 98.9 F (37.2 C) (Oral) 19 99%  Intake/Output Summary (Last 24 hours) at 05/19/2020 0940 Last data filed at 05/19/2020 0500 Gross per 24 hour  Intake 1307.33 ml  Output --  Net 1307.33 ml    Significant left leg edema but left DP pulses 2+ with no signs of phlegmasia  Laboratory Lab Results: Recent Labs    05/18/20 0454 05/19/20 0619  WBC 11.3* 9.2  HGB 13.0 12.6*  HCT 41.5 37.6*  PLT 444* 313   BMET Recent Labs    05/18/20 0454 05/19/20 0619  NA 132* 132*  K 4.0 4.2  CL 95* 100  CO2 21* 21*  GLUCOSE 143* 115*  BUN 14 12  CREATININE 1.44* 1.22  CALCIUM 9.5 8.9    COAG Lab Results  Component Value Date   INR 1.1 04/30/2020   INR 1.1 03/19/2020   No results found for: PTT  Assessment/Planning:  50 year old male with extensive left leg DVT including iliac vein involvement with recent iliac vein injury at the caval bifurcation during attempted anterior L5-S1 spine exposure.  I suspect his DVT is related to that venous injury and then additional recent spine surgery.  Again we will try to avoid any percutaneous mechanical thrombectomy if possible given recent iliac vein injury.  Continue heparin IV and discussed with him the importance of elevating his leg today and I did provide several pillows for him to get his leg propped up.  We will continue to follow.  Tolerating heparin.  Jason Moore 05/19/2020 9:40 AM --

## 2020-05-19 NOTE — Progress Notes (Addendum)
ANTICOAGULATION CONSULT NOTE  Pharmacy Consult for heparin Indication: DVT and PE  Allergies  Allergen Reactions  . Tape Other (See Comments)    Fabric-like tape left red bumps on the skin after it was removed  . Other Other (See Comments)    Seasonal allergies- Itchy eyes, runny nose, etc..    Patient Measurements: Height: 5\' 11"  (180.3 cm) Weight: 125 kg (275 lb 9.2 oz) IBW/kg (Calculated) : 75.3 Heparin Dosing Weight: 103.4 kg  Vital Signs: Temp: 98.9 F (37.2 C) (02/05 0516) Temp Source: Oral (02/05 0516) BP: 121/77 (02/05 0516) Pulse Rate: 78 (02/05 0516)  Labs: Recent Labs    05/18/20 0454 05/18/20 0814 05/18/20 1830 05/19/20 0619  HGB 13.0  --   --  12.6*  HCT 41.5  --   --  37.6*  PLT 444*  --   --  313  HEPARINUNFRC  --   --  1.03* 0.39  CREATININE 1.44*  --   --   --   CKTOTAL  --  198  --   --     Estimated Creatinine Clearance: 82.6 mL/min (A) (by C-G formula based on SCr of 1.44 mg/dL (H)).   Medical History: Past Medical History:  Diagnosis Date  . Anxiety   . Back pain   . Degeneration of lumbar intervertebral disc   . Hypertension   . Isthmic spondylolisthesis   . Joint pain   . Lumbar radiculopathy   . Numbness and tingling   . Sleep apnea    cpap @ [facility]    Assessment: 50 yo M found to have a pulmonary embolus in R lower lobe on CTA and an acute LLE DVT on doppler imaging. Pharmacy asked to start IV heparin. No AC noted PTA. CBC wnl.  Repeat heparin level therapeutic (0.39) on 1500 units/hr. No bleeding or issue with line infusing noted. CBC remains wnl.   Goal of Therapy:  Heparin level 0.3-0.7 units/ml Monitor platelets by anticoagulation protocol: Yes   Plan:  Continue heparin gtt at 1500 units/hr F/u 6 hour heparin level Daily HL and CBC while on heparin Monitor for S/sx of bleeding   44, PharmD PGY1 Acute Care Pharmacy Resident 05/19/2020 7:16 AM  Please check [email or web address] for unit-specific pharmacy phone  numbers.  Addendum: Confirmatory HL slightly subtherapeutic at 0.26. No bleeding or issues with line noted per RN. Will increase rate and recheck HL tonight. -Increase heparin gtt to 1600 units/hr  -Check 6-hr HL

## 2020-05-20 LAB — BASIC METABOLIC PANEL
Anion gap: 12 (ref 5–15)
BUN: 11 mg/dL (ref 6–20)
CO2: 22 mmol/L (ref 22–32)
Calcium: 8.8 mg/dL — ABNORMAL LOW (ref 8.9–10.3)
Chloride: 98 mmol/L (ref 98–111)
Creatinine, Ser: 1.18 mg/dL (ref 0.61–1.24)
GFR, Estimated: >60 mL/min (ref >60)
Glucose, Bld: 110 mg/dL — ABNORMAL HIGH (ref 70–99)
Potassium: 3.8 mmol/L (ref 3.5–5.1)
Sodium: 132 mmol/L — ABNORMAL LOW (ref 135–145)

## 2020-05-20 LAB — LACTIC ACID, PLASMA: Lactic Acid, Venous: 1.1 mmol/L (ref 0.5–1.9)

## 2020-05-20 LAB — CBC
HCT: 35.6 % — ABNORMAL LOW (ref 39.0–52.0)
Hemoglobin: 11.7 g/dL — ABNORMAL LOW (ref 13.0–17.0)
MCH: 29.7 pg (ref 26.0–34.0)
MCHC: 32.9 g/dL (ref 30.0–36.0)
MCV: 90.4 fL (ref 80.0–100.0)
Platelets: 306 10*3/uL (ref 150–400)
RBC: 3.94 MIL/uL — ABNORMAL LOW (ref 4.22–5.81)
RDW: 13.6 % (ref 11.5–15.5)
WBC: 8.5 10*3/uL (ref 4.0–10.5)
nRBC: 0 % (ref 0.0–0.2)

## 2020-05-20 LAB — HEPARIN LEVEL (UNFRACTIONATED): Heparin Unfractionated: 0.38 [IU]/mL (ref 0.30–0.70)

## 2020-05-20 MED ORDER — POLYETHYLENE GLYCOL 3350 17 G PO PACK
17.0000 g | PACK | Freq: Two times a day (BID) | ORAL | Status: DC
  Administered 2020-05-21 – 2020-05-23 (×3): 17 g via ORAL
  Filled 2020-05-20 (×5): qty 1

## 2020-05-20 NOTE — Progress Notes (Signed)
Patient on home unit for cpap. Patient able to put on and take off independently.

## 2020-05-20 NOTE — Progress Notes (Signed)
PROGRESS NOTE  Jason Moore WGN:[id number] DOB: 07-27-1970 DOA: 05/18/2020 PCP: [name], MD  HPI/Recap of past 24 hours: HPI from Dr Tawni Levy is a 50 y.o. M with hx HTN, OSA on CPAP and recent lumbar fusion who presents with acute left leg swelling. Patient has chronic degenerative back disease, and was to have L5-S1 fusion last Dec, from an anterior approach, but suffered a tear in the left common iliac vein, which was repaired, and the fusion was aborted. On 1/19 pt had lumbar spinal fusion surgery which was completed then without complication. For the past day or so, pt started noticing worsening swelling and discomfort in his left leg, therefore presented to the ED. In the ER, he had HR 136, and swelling in the left thigh without loss of distal pulse.  US of the left leg showed extensive left DVT.  CTA chest showed a small right sided pulmonary embolism.  Lactic acid elevated but no signs or symptoms to suggest infection.  Patient admitted for further management.    Today, patient denies any worsening SOB, chest pain, abdominal pain, N/V/F/C.      Assessment/Plan: Principal Problem:   Acute pulmonary embolism (HCC) Active Problems:   Hypertension   Degenerative disc disease at L5-S1 level   DVT (deep venous thrombosis) (HCC)   OSA (obstructive sleep apnea)   Acute extensive LLE DVT Acute pulmonary embolism Likely provoked s/p surgery Vascular surgeon on board, continue IV heparin for now due to recent left iliac vein repair Continue IV Heparin-pharmacy to dose  Elevated lactic acidosis Resolved Unlikely due to sepsis/infection Possibly due to above  Hypertension Continue nebivolol  Recent lumbar spinal fusion Continue pain management  OSA Continue CPAP at night  Obesity Lifestyle modification advised       Malnutrition Type:      Malnutrition Characteristics:      Nutrition Interventions:       Estimated body mass index  is 38.43 kg/m as calculated from the following:   Height as of this encounter: 5\' 11"  (1.803 m).   Weight as of this encounter: 125 kg.     Code Status: Full  Family Communication: Discussed with wife at bedside on 05/20/20  Disposition Plan: Status is: Inpatient  Remains inpatient appropriate because:Inpatient level of care appropriate due to severity of illness   Dispo: The patient is from: Home              Anticipated d/c is to: Home              Anticipated d/c date is: 2 days              Patient currently is not medically stable to d/c.   Difficult to place patient No   Consultants:  Vascular surgery  Orthopedic surgery  Procedures:  None  Antimicrobials:  None  DVT prophylaxis: IV Heparin   Objective: Vitals:   05/19/20 0516 05/19/20 1440 05/19/20 2021 05/20/20 0612  BP: 121/77 129/82 125/88 120/86  Pulse: 78 79 84 74  Resp: 19 16 18 18   Temp: 98.9 F (37.2 C) 98.2 F (36.8 C) 99.1 F (37.3 C) 99 F (37.2 C)  TempSrc: Oral  Oral Oral  SpO2: 99% 97% 98% 96%  Weight:      Height:        Intake/Output Summary (Last 24 hours) at 05/20/2020 0949 Last data filed at 05/20/2020 0336 Gross per 24 hour  Intake 345.93 ml  Output 1000 ml  Net -654.07 ml   Filed Weights   05/18/20 0450  Weight: 125 kg    Exam:  General: NAD   Cardiovascular: S1, S2 present  Respiratory: CTAB  Abdomen: Soft, nontender, nondistended, bowel sounds present  Musculoskeletal: 2+ LLE edema wrapped with ACE wrap, pulses present, able to move his foot  Skin: Normal  Psychiatry: Normal mood    Data Reviewed: CBC: Recent Labs  Lab 05/18/20 0454 05/19/20 0619 05/20/20 0113  WBC 11.3* 9.2 8.5  NEUTROABS 9.0*  --   --   HGB 13.0 12.6* 11.7*  HCT 41.5 37.6* 35.6*  MCV 92.4 90.4 90.4  PLT 444* 313 306   Basic Metabolic Panel: Recent Labs  Lab 05/18/20 0454 05/19/20 0619 05/20/20 0113  NA 132* 132* 132*  K 4.0 4.2 3.8  CL 95* 100 98  CO2 21* 21* 22   GLUCOSE 143* 115* 110*  BUN 14 12 11   CREATININE 1.44* 1.22 1.18  CALCIUM 9.5 8.9 8.8*   GFR: Estimated Creatinine Clearance: 100.8 mL/min (by C-G formula based on SCr of 1.18 mg/dL). Liver Function Tests: Recent Labs  Lab 05/18/20 0454  AST 27  ALT 30  ALKPHOS 81  BILITOT 0.8  PROT 7.4  ALBUMIN 3.9   No results for input(s): LIPASE, AMYLASE in the last 168 hours. No results for input(s): AMMONIA in the last 168 hours. Coagulation Profile: No results for input(s): INR, PROTIME in the last 168 hours. Cardiac Enzymes: Recent Labs  Lab 05/18/20 0814  CKTOTAL 198   BNP (last 3 results) No results for input(s): PROBNP in the last 8760 hours. HbA1C: No results for input(s): HGBA1C in the last 72 hours. CBG: No results for input(s): GLUCAP in the last 168 hours. Lipid Profile: No results for input(s): CHOL, HDL, LDLCALC, TRIG, CHOLHDL, LDLDIRECT in the last 72 hours. Thyroid Function Tests: No results for input(s): TSH, T4TOTAL, FREET4, T3FREE, THYROIDAB in the last 72 hours. Anemia Panel: No results for input(s): VITAMINB12, FOLATE, FERRITIN, TIBC, IRON, RETICCTPCT in the last 72 hours. Urine analysis:    Component Value Date/Time   COLORURINE YELLOW 05/18/2020 0450   APPEARANCEUR CLEAR 05/18/2020 0450   LABSPEC 1.020 05/18/2020 0450   PHURINE 5.0 05/18/2020 0450   GLUCOSEU NEGATIVE 05/18/2020 0450   HGBUR NEGATIVE 05/18/2020 0450   BILIRUBINUR NEGATIVE 05/18/2020 0450   KETONESUR NEGATIVE 05/18/2020 0450   PROTEINUR NEGATIVE 05/18/2020 0450   NITRITE NEGATIVE 05/18/2020 0450   LEUKOCYTESUR NEGATIVE 05/18/2020 0450   Sepsis Labs: @LABRCNTIP (procalcitonin:4,lacticidven:4)  ) Recent Results (from the past 240 hour(s))  SARS Coronavirus 2 by RT PCR (hospital order, performed in [facility] hospital lab) Nasopharyngeal Nasopharyngeal Swab     Status: None   Collection Time: 05/18/20  8:50 AM   Specimen: Nasopharyngeal Swab  Result Value Ref Range Status   SARS  Coronavirus 2 NEGATIVE NEGATIVE Final    Comment: (NOTE) SARS-CoV-2 target nucleic acids are NOT DETECTED.  The SARS-CoV-2 RNA is generally detectable in upper and lower respiratory specimens during the acute phase of infection. The lowest concentration of SARS-CoV-2 viral copies this assay can detect is 250 copies / mL. A negative result does not preclude SARS-CoV-2 infection and should not be used as the sole basis for treatment or other patient management decisions.  A negative result may occur with improper specimen collection / handling, submission of specimen other than nasopharyngeal swab, presence of viral mutation(s) within the areas targeted by this assay, and inadequate number of viral copies (<250 copies / mL). A  negative result must be combined with clinical observations, patient history, and epidemiological information.  Fact Sheet for Patients:   BoilerBrush.com.cy  Fact Sheet for Healthcare Providers: https://pope.com/  This test is not yet approved or  cleared by the [place] FDA and has been authorized for detection and/or diagnosis of SARS-CoV-2 by FDA under an Emergency Use Authorization (EUA).  This EUA will remain in effect (meaning this test can be used) for the duration of the COVID-19 declaration under Section 564(b)(1) of the Act, 21 U.S.C. section 360bbb-3(b)(1), unless the authorization is terminated or revoked sooner.  Performed at [facility], [address], [place], [place] [address]   Blood culture (routine x 2)     Status: None (Preliminary result)   Collection Time: 05/18/20  1:10 PM   Specimen: BLOOD  Result Value Ref Range Status   Specimen Description BLOOD SITE NOT SPECIFIED  Final   Special Requests   Final    BOTTLES DRAWN AEROBIC AND ANAEROBIC Blood Culture adequate volume   Culture   Final    NO GROWTH 1 DAY Performed at [facility], [address], [place],  [place] [address]    Report Status PENDING  Incomplete  Blood culture (routine x 2)     Status: None (Preliminary result)   Collection Time: 05/18/20  6:30 PM   Specimen: BLOOD RIGHT ARM  Result Value Ref Range Status   Specimen Description BLOOD RIGHT ARM  Final   Special Requests   Final    BOTTLES DRAWN AEROBIC AND ANAEROBIC Blood Culture adequate volume   Culture   Final    NO GROWTH < 24 HOURS Performed at [facility], [address], [place], [place] [address]    Report Status PENDING  Incomplete      Studies: No results found.  Scheduled Meds: . gabapentin  300 mg Oral TID  . nebivolol  5 mg Oral Daily  . polyethylene glycol  17 g Oral Daily  . senna-docusate  1 tablet Oral BID    Continuous Infusions: . heparin 1,600 Units/hr (05/20/20 0500)     LOS: 2 days     Briant Cedar, MD [facility]  If 7PM-7AM, please contact night-coverage [email or web address] 05/20/2020, 9:49 AM

## 2020-05-20 NOTE — Progress Notes (Signed)
[facility] [place]  Subjective  - tolerating heparin gtt.  Still significant swelling to left leg.   Objective 120/86 74 99 F (37.2 C) (Oral) 18 96%  Intake/Output Summary (Last 24 hours) at 05/20/2020 1129 Last data filed at 05/20/2020 0336 Gross per 24 hour  Intake 345.93 ml  Output 1000 ml  Net -654.07 ml    Significant left leg edema but left DP pulses 2+ with no signs of phlegmasia  Laboratory Lab Results: Recent Labs    05/19/20 0619 05/20/20 0113  WBC 9.2 8.5  HGB 12.6* 11.7*  HCT 37.6* 35.6*  PLT 313 306   BMET Recent Labs    05/19/20 0619 05/20/20 0113  NA 132* 132*  K 4.2 3.8  CL 100 98  CO2 21* 22  GLUCOSE 115* 110*  BUN 12 11  CREATININE 1.22 1.18  CALCIUM 8.9 8.8*    COAG Lab Results  Component Value Date   INR 1.1 04/30/2020   INR 1.1 03/19/2020   No results found for: PTT  Assessment/Planning:  50 year old male with extensive left leg DVT including iliac vein involvement in the setting of recent iliac vein injury at the caval bifurcation during attempted anterior L5-S1 spine exposure on 03/21/20.  I suspect his DVT is related to that venous injury and then additional recent spine surgery.  Again we will try to avoid any percutaneous mechanical thrombectomy if possible given recent iliac vein injury as I discussed with him again today.    Continue heparin IV.  I wrapped his leg with an ace from the ankle up to the upper thigh to see if this helps.  We will continue to follow.  Will eventually need transition to DOAC but would continue heparin today.  Cephus Shelling 05/20/2020 11:29 AM --

## 2020-05-20 NOTE — Progress Notes (Signed)
ANTICOAGULATION CONSULT NOTE  Pharmacy Consult for heparin Indication: DVT and PE  Allergies  Allergen Reactions  . Tape Other (See Comments)    Fabric-like tape left red bumps on the skin after it was removed  . Other Other (See Comments)    Seasonal allergies- Itchy eyes, runny nose, etc..    Patient Measurements: Height: 5\' 11"  (180.3 cm) Weight: 125 kg (275 lb 9.2 oz) IBW/kg (Calculated) : 75.3 Heparin Dosing Weight: 103.4 kg  Vital Signs: Temp: 99 F (37.2 C) (02/06 0612) Temp Source: Oral (02/06 0612) BP: 120/86 (02/06 0612) Pulse Rate: 74 (02/06 0612)  Labs: Recent Labs    05/18/20 0454 05/18/20 0814 05/18/20 1830 05/19/20 0619 05/19/20 1233 05/19/20 2010 05/20/20 0113  HGB 13.0  --   --  12.6*  --   --  11.7*  HCT 41.5  --   --  37.6*  --   --  35.6*  PLT 444*  --   --  313  --   --  306  HEPARINUNFRC  --   --    < > 0.39 0.26* 0.36 0.38  CREATININE 1.44*  --   --  1.22  --   --  1.18  CKTOTAL  --  198  --   --   --   --   --    < > = values in this interval not displayed.    Estimated Creatinine Clearance: 100.8 mL/min (by C-G formula based on SCr of 1.18 mg/dL).   Medical History: Past Medical History:  Diagnosis Date  . Anxiety   . Back pain   . Degeneration of lumbar intervertebral disc   . Hypertension   . Isthmic spondylolisthesis   . Joint pain   . Lumbar radiculopathy   . Numbness and tingling   . Sleep apnea    cpap @ [facility]    Assessment: 50 yo M found to have a pulmonary embolus in R lower lobe on CTA and an acute LLE DVT on doppler imaging. Pharmacy asked to start IV heparin. No AC noted PTA. CBC wnl.  Heparin level this morning is therapeutic (0.38) on 1600 units/hr. No bleeding or issue with line infusing noted. CBC remains wnl.   Goal of Therapy:  Heparin level 0.3-0.7 units/ml Monitor platelets by anticoagulation protocol: Yes   Plan:  Continue heparin gtt at 1600 units/hr Daily HL and CBC while on heparin Monitor for S/sx  of bleeding   44, PharmD PGY1 Acute Care Pharmacy Resident 05/20/2020 9:32 AM  Please check [email or web address] for unit-specific pharmacy phone numbers.

## 2020-05-21 LAB — BASIC METABOLIC PANEL
Anion gap: 12 (ref 5–15)
BUN: 9 mg/dL (ref 6–20)
CO2: 20 mmol/L — ABNORMAL LOW (ref 22–32)
Calcium: 8.6 mg/dL — ABNORMAL LOW (ref 8.9–10.3)
Chloride: 100 mmol/L (ref 98–111)
Creatinine, Ser: 1.14 mg/dL (ref 0.61–1.24)
GFR, Estimated: >60 mL/min (ref >60)
Glucose, Bld: 95 mg/dL (ref 70–99)
Potassium: 4.1 mmol/L (ref 3.5–5.1)
Sodium: 132 mmol/L — ABNORMAL LOW (ref 135–145)

## 2020-05-21 LAB — CBC
HCT: 34.3 % — ABNORMAL LOW (ref 39.0–52.0)
Hemoglobin: 11.4 g/dL — ABNORMAL LOW (ref 13.0–17.0)
MCH: 29.6 pg (ref 26.0–34.0)
MCHC: 33.2 g/dL (ref 30.0–36.0)
MCV: 89.1 fL (ref 80.0–100.0)
Platelets: 278 10*3/uL (ref 150–400)
RBC: 3.85 MIL/uL — ABNORMAL LOW (ref 4.22–5.81)
RDW: 13.7 % (ref 11.5–15.5)
WBC: 8.1 10*3/uL (ref 4.0–10.5)
nRBC: 0 % (ref 0.0–0.2)

## 2020-05-21 LAB — HEPARIN LEVEL (UNFRACTIONATED)
Heparin Unfractionated: 0.12 IU/mL — ABNORMAL LOW (ref 0.30–0.70)
Heparin Unfractionated: 0.36 IU/mL (ref 0.30–0.70)
Heparin Unfractionated: 0.42 IU/mL (ref 0.30–0.70)

## 2020-05-21 MED ORDER — HEPARIN BOLUS VIA INFUSION
3000.0000 [IU] | Freq: Once | INTRAVENOUS | Status: AC
  Administered 2020-05-21: 3000 [IU] via INTRAVENOUS
  Filled 2020-05-21: qty 3000

## 2020-05-21 MED ORDER — OXYCODONE HCL 5 MG PO TABS
5.0000 mg | ORAL_TABLET | ORAL | Status: AC | PRN
Start: 2020-05-21 — End: 2020-05-23
  Administered 2020-05-21 – 2020-05-22 (×2): 5 mg via ORAL
  Administered 2020-05-22: 10 mg via ORAL
  Administered 2020-05-22: 5 mg via ORAL
  Administered 2020-05-22: 10 mg via ORAL
  Administered 2020-05-23: 5 mg via ORAL
  Filled 2020-05-21: qty 1
  Filled 2020-05-21 (×2): qty 2
  Filled 2020-05-21 (×2): qty 1

## 2020-05-21 MED ORDER — ACETAMINOPHEN 325 MG PO TABS: 325.0000 mg | ORAL_TABLET | ORAL | Status: DC | PRN

## 2020-05-21 NOTE — Progress Notes (Signed)
ANTICOAGULATION CONSULT NOTE  Pharmacy Consult for heparin Indication: DVT and PE  Allergies  Allergen Reactions  . Tape Other (See Comments)    Fabric-like tape left red bumps on the skin after it was removed  . Other Other (See Comments)    Seasonal allergies- Itchy eyes, runny nose, etc..    Patient Measurements: Height: 5\' 11"  (180.3 cm) Weight: 125 kg (275 lb 9.2 oz) IBW/kg (Calculated) : 75.3 Heparin Dosing Weight: 103.4 kg  Vital Signs: Temp: 99.3 F (37.4 C) (02/07 1606) BP: 126/80 (02/07 1606) Pulse Rate: 75 (02/07 1606)  Labs: Recent Labs    05/19/20 0619 05/19/20 1233 05/20/20 0113 05/21/20 0123 05/21/20 0939 05/21/20 1726  HGB 12.6*  --  11.7* 11.4*  --   --   HCT 37.6*  --  35.6* 34.3*  --   --   PLT 313  --  306 278  --   --   HEPARINUNFRC 0.39   < > 0.38 0.12* 0.36 0.42  CREATININE 1.22  --  1.18 1.14  --   --    < > = values in this interval not displayed.    Estimated Creatinine Clearance: 104.4 mL/min (by C-G formula based on SCr of 1.14 mg/dL).   Assessment: 50 yo M found to have a pulmonary embolus in R lower lobe on CTA and an acute LLE DVT on doppler imaging. Pharmacy asked to dose IV heparin.  Heparin level therapeutic on confirmatory level; no bleeding reported.  Goal of Therapy:  Heparin level 0.3-0.7 units/ml Monitor platelets by anticoagulation protocol: Yes   Plan:  Continue heparin infusion at 1950 units/hr  Daily heparin level and CBC  44 PharmD., BCPS Clinical Pharmacist 05/21/2020 6:52 PM

## 2020-05-21 NOTE — Progress Notes (Signed)
ANTICOAGULATION CONSULT NOTE  Pharmacy Consult for heparin Indication: DVT and PE  Allergies  Allergen Reactions  . Tape Other (See Comments)    Fabric-like tape left red bumps on the skin after it was removed  . Other Other (See Comments)    Seasonal allergies- Itchy eyes, runny nose, etc..    Patient Measurements: Height: 5\' 11"  (180.3 cm) Weight: 125 kg (275 lb 9.2 oz) IBW/kg (Calculated) : 75.3 Heparin Dosing Weight: 103.4 kg  Vital Signs: Temp: 99.1 F (37.3 C) (02/07 0527) Temp Source: Oral (02/07 0527) BP: 122/90 (02/07 0527) Pulse Rate: 88 (02/07 0527)  Labs: Recent Labs    05/19/20 0619 05/19/20 1233 05/20/20 0113 05/21/20 0123 05/21/20 0939  HGB 12.6*  --  11.7* 11.4*  --   HCT 37.6*  --  35.6* 34.3*  --   PLT 313  --  306 278  --   HEPARINUNFRC 0.39   < > 0.38 0.12* 0.36  CREATININE 1.22  --  1.18 1.14  --    < > = values in this interval not displayed.    Estimated Creatinine Clearance: 104.4 mL/min (by C-G formula based on SCr of 1.14 mg/dL).   Assessment: 50 yo M found to have a pulmonary embolus in R lower lobe on CTA and an acute LLE DVT on doppler imaging. Pharmacy asked to dose IV heparin.  Heparin level therapeutic; no bleeding reported.  Goal of Therapy:  Heparin level 0.3-0.7 units/ml Monitor platelets by anticoagulation protocol: Yes   Plan:  Increase heparin infusion slightly to 1950 units/hr  Check confirmatory heparin level  Daily heparin level and CBC  Callaway Hailes D. 50, PharmD, BCPS, BCCCP 05/21/2020, 10:54 AM

## 2020-05-21 NOTE — Progress Notes (Signed)
PROGRESS NOTE  Jason Moore WUJ:[id number] DOB: June 14, 1970 DOA: 05/18/2020 PCP: Jason James, MD  HPI/Recap of past 24 hours: HPI from Dr Tawni Levy is a 50 y.o. M with hx HTN, OSA on CPAP and recent lumbar fusion who presents with acute left leg swelling. Patient has chronic degenerative back disease, and was to have L5-S1 fusion last Dec, from an anterior approach, but suffered a tear in the left common iliac vein, which was repaired, and the fusion was aborted. On 1/19 pt had lumbar spinal fusion surgery which was completed then without complication. For the past day or so, pt started noticing worsening swelling and discomfort in his left leg, therefore presented to the ED. In the ER, he had HR 136, and swelling in the left thigh without loss of distal pulse.  US of the left leg showed extensive left DVT.  CTA chest showed a small right sided pulmonary embolism.  Lactic acid elevated but no signs or symptoms to suggest infection.  Patient admitted for further management.    Today, pt denies any new complaints      Assessment/Plan: Principal Problem:   Acute pulmonary embolism (HCC) Active Problems:   Hypertension   Degenerative disc disease at L5-S1 level   DVT (deep venous thrombosis) (HCC)   OSA (obstructive sleep apnea)   Acute extensive LLE DVT Acute pulmonary embolism Likely provoked s/p surgery Vascular surgeon on board, continue IV heparin for now due to recent left iliac vein repair Continue IV Heparin-pharmacy to dose  Elevated lactic acidosis Resolved Unlikely due to sepsis/infection Possibly due to above  Hypertension Continue nebivolol  Recent lumbar spinal fusion Continue pain management  OSA Continue CPAP at night  Obesity Lifestyle modification advised       Malnutrition Type:      Malnutrition Characteristics:      Nutrition Interventions:       Estimated body mass index is 38.43 kg/m as calculated from the  following:   Height as of this encounter: 5\' 11"  (1.803 m).   Weight as of this encounter: 125 kg.     Code Status: Full  Family Communication: Discussed with wife at bedside on 05/21/20  Disposition Plan: Status is: Inpatient  Remains inpatient appropriate because:Inpatient level of care appropriate due to severity of illness   Dispo: The patient is from: Home              Anticipated d/c is to: Home              Anticipated d/c date is: 2 days              Patient currently is not medically stable to d/c.   Difficult to place patient No   Consultants:  Vascular surgery  Orthopedic surgery  Procedures:  None  Antimicrobials:  None  DVT prophylaxis: IV Heparin   Objective: Vitals:   05/19/20 2021 05/20/20 0612 05/20/20 2115 05/21/20 0527  BP: 125/88 120/86 (!) 141/77 122/90  Pulse: 84 74 86 88  Resp: 18 18 18 18   Temp: 99.1 F (37.3 C) 99 F (37.2 C)  99.1 F (37.3 C)  TempSrc: Oral Oral  Oral  SpO2: 98% 96% 97% 95%  Weight:      Height:        Intake/Output Summary (Last 24 hours) at 05/21/2020 1337 Last data filed at 05/21/2020 0308 Gross per 24 hour  Intake 375.56 ml  Output 850 ml  Net -474.44 ml   07/19/2020  05/18/20 0450  Weight: 125 kg    Exam:  General: NAD   Cardiovascular: S1, S2 present  Respiratory: CTAB  Abdomen: Soft, nontender, nondistended, bowel sounds present  Musculoskeletal: 1+ LLE edema wrapped with ACE wrap, pulses present, able to move his foot  Skin: Normal  Psychiatry: Normal mood    Data Reviewed: CBC: Recent Labs  Lab 05/18/20 0454 05/19/20 0619 05/20/20 0113 05/21/20 0123  WBC 11.3* 9.2 8.5 8.1  NEUTROABS 9.0*  --   --   --   HGB 13.0 12.6* 11.7* 11.4*  HCT 41.5 37.6* 35.6* 34.3*  MCV 92.4 90.4 90.4 89.1  PLT 444* 313 306 278   Basic Metabolic Panel: Recent Labs  Lab 05/18/20 0454 05/19/20 0619 05/20/20 0113 05/21/20 0123  NA 132* 132* 132* 132*  K 4.0 4.2 3.8 4.1  CL 95* 100 98 100   CO2 21* 21* 22 20*  GLUCOSE 143* 115* 110* 95  BUN 14 12 11 9   CREATININE 1.44* 1.22 1.18 1.14  CALCIUM 9.5 8.9 8.8* 8.6*   GFR: Estimated Creatinine Clearance: 104.4 mL/min (by C-G formula based on SCr of 1.14 mg/dL). Liver Function Tests: Recent Labs  Lab 05/18/20 0454  AST 27  ALT 30  ALKPHOS 81  BILITOT 0.8  PROT 7.4  ALBUMIN 3.9   No results for input(s): LIPASE, AMYLASE in the last 168 hours. No results for input(s): AMMONIA in the last 168 hours. Coagulation Profile: No results for input(s): INR, PROTIME in the last 168 hours. Cardiac Enzymes: Recent Labs  Lab 05/18/20 0814  CKTOTAL 198   BNP (last 3 results) No results for input(s): PROBNP in the last 8760 hours. HbA1C: No results for input(s): HGBA1C in the last 72 hours. CBG: No results for input(s): GLUCAP in the last 168 hours. Lipid Profile: No results for input(s): CHOL, HDL, LDLCALC, TRIG, CHOLHDL, LDLDIRECT in the last 72 hours. Thyroid Function Tests: No results for input(s): TSH, T4TOTAL, FREET4, T3FREE, THYROIDAB in the last 72 hours. Anemia Panel: No results for input(s): VITAMINB12, FOLATE, FERRITIN, TIBC, IRON, RETICCTPCT in the last 72 hours. Urine analysis:    Component Value Date/Time   COLORURINE YELLOW 05/18/2020 0450   APPEARANCEUR CLEAR 05/18/2020 0450   LABSPEC 1.020 05/18/2020 0450   PHURINE 5.0 05/18/2020 0450   GLUCOSEU NEGATIVE 05/18/2020 0450   HGBUR NEGATIVE 05/18/2020 0450   BILIRUBINUR NEGATIVE 05/18/2020 0450   KETONESUR NEGATIVE 05/18/2020 0450   PROTEINUR NEGATIVE 05/18/2020 0450   NITRITE NEGATIVE 05/18/2020 0450   LEUKOCYTESUR NEGATIVE 05/18/2020 0450   Sepsis Labs: @LABRCNTIP (procalcitonin:4,lacticidven:4)  ) Recent Results (from the past 240 hour(s))  SARS Coronavirus 2 by RT PCR (hospital order, performed in [facility] hospital lab) Nasopharyngeal Nasopharyngeal Swab     Status: None   Collection Time: 05/18/20  8:50 AM   Specimen: Nasopharyngeal Swab   Result Value Ref Range Status   SARS Coronavirus 2 NEGATIVE NEGATIVE Final    Comment: (NOTE) SARS-CoV-2 target nucleic acids are NOT DETECTED.  The SARS-CoV-2 RNA is generally detectable in upper and lower respiratory specimens during the acute phase of infection. The lowest concentration of SARS-CoV-2 viral copies this assay can detect is 250 copies / mL. A negative result does not preclude SARS-CoV-2 infection and should not be used as the sole basis for treatment or other patient management decisions.  A negative result may occur with improper specimen collection / handling, submission of specimen other than nasopharyngeal swab, presence of viral mutation(s) within the areas targeted by this assay,  and inadequate number of viral copies (<250 copies / mL). A negative result must be combined with clinical observations, patient history, and epidemiological information.  Fact Sheet for Patients:   BoilerBrush.com.cy  Fact Sheet for Healthcare Providers: https://pope.com/  This test is not yet approved or  cleared by the [place] FDA and has been authorized for detection and/or diagnosis of SARS-CoV-2 by FDA under an Emergency Use Authorization (EUA).  This EUA will remain in effect (meaning this test can be used) for the duration of the COVID-19 declaration under Section 564(b)(1) of the Act, 21 U.S.C. section 360bbb-3(b)(1), unless the authorization is terminated or revoked sooner.  Performed at [facility], [address] [place], [place] [address]   Blood culture (routine x 2)     Status: None (Preliminary result)   Collection Time: 05/18/20  1:10 PM   Specimen: BLOOD  Result Value Ref Range Status   Specimen Description BLOOD SITE NOT SPECIFIED  Final   Special Requests   Final    BOTTLES DRAWN AEROBIC AND ANAEROBIC Blood Culture adequate volume   Culture   Final    NO GROWTH 3 DAYS Performed at [facility], [address] [place], [place] [address]    Report Status PENDING  Incomplete  Blood culture (routine x 2)     Status: None (Preliminary result)   Collection Time: 05/18/20  6:30 PM   Specimen: BLOOD RIGHT ARM  Result Value Ref Range Status   Specimen Description BLOOD RIGHT ARM  Final   Special Requests   Final    BOTTLES DRAWN AEROBIC AND ANAEROBIC Blood Culture adequate volume   Culture   Final    NO GROWTH 3 DAYS Performed at [facility], [address] [place], [place] [address]    Report Status PENDING  Incomplete      Studies: No results found.  Scheduled Meds: . gabapentin  300 mg Oral TID  . nebivolol  5 mg Oral Daily  . polyethylene glycol  17 g Oral BID  . senna-docusate  1 tablet Oral BID    Continuous Infusions: . heparin 1,950 Units/hr (05/21/20 1101)     LOS: 3 days     Briant Cedar, MD [facility]  If 7PM-7AM, please contact night-coverage [email or web address] 05/21/2020, 1:37 PM

## 2020-05-21 NOTE — Progress Notes (Addendum)
[facility]  Subjective  - Able to move foot and ankle better.   Objective 122/90 88 99.1 F (37.3 C) (Oral) 18 95%  Intake/Output Summary (Last 24 hours) at 05/21/2020 0724 Last data filed at 05/21/2020 0308 Gross per 24 hour  Intake 375.56 ml  Output 850 ml  Net -474.44 ml    Ace wrap adjusted Left LE edema, motor and sensation intact Lungs non labored breathing Heart RRR  Assessment/Planning: 50 year old male with extensive left leg DVT including iliac vein involvement in the setting of recent iliac vein injury at the caval bifurcation during attempted anterior L5-S1 spine exposure on 03/21/20.  I suspect his DVT is related to that venous injury and then additional recent spine surgery.  Plan for Heparin to DOAC transition.  Trying to avoid intervention with mechanical thrombectomy due to vein injury. I will order compression thigh high for left LE. Elevation demonstrated at bedside for gravity assistance.  Mosetta Pigeon 05/21/2020 7:24 AM --  Laboratory Lab Results: Recent Labs    05/20/20 0113 05/21/20 0123  WBC 8.5 8.1  HGB 11.7* 11.4*  HCT 35.6* 34.3*  PLT 306 278   BMET Recent Labs    05/20/20 0113 05/21/20 0123  NA 132* 132*  K 3.8 4.1  CL 98 100  CO2 22 20*  GLUCOSE 110* 95  BUN 11 9  CREATININE 1.18 1.14  CALCIUM 8.8* 8.6*    COAG Lab Results  Component Value Date   INR 1.1 04/30/2020   INR 1.1 03/19/2020   No results found for: PTT  I have seen and evaluated the patient. I agree with the PA note as documented above.  50 year old male admitted with extensive left leg DVT after iliac vein injury back in December during an L5-S1 spine exposure.  Tolerated heparin through the weekend and hemoglobin is stable.  Heparin level was therapeutic.  I think his calf is a bit softer.  Will order thigh-high compression stocking.  We have his leg wrapped until the stocking arrives.  Again trying to avoid intervention  given his recent iliac vein injury  Cephus Shelling, MD [facility]: [phone]

## 2020-05-21 NOTE — Progress Notes (Signed)
ANTICOAGULATION CONSULT NOTE  Pharmacy Consult for heparin Indication: DVT and PE  Allergies  Allergen Reactions  . Tape Other (See Comments)    Fabric-like tape left red bumps on the skin after it was removed  . Other Other (See Comments)    Seasonal allergies- Itchy eyes, runny nose, etc..    Patient Measurements: Height: 5\' 11"  (180.3 cm) Weight: 125 kg (275 lb 9.2 oz) IBW/kg (Calculated) : 75.3 Heparin Dosing Weight: 103.4 kg  Vital Signs: BP: 141/77 (02/06 2115) Pulse Rate: 86 (02/06 2115)  Labs: Recent Labs    05/18/20 0454 05/18/20 0814 05/18/20 1830 05/19/20 0619 05/19/20 1233 05/19/20 2010 05/20/20 0113 05/21/20 0123  HGB 13.0  --   --  12.6*  --   --  11.7*  --   HCT 41.5  --   --  37.6*  --   --  35.6*  --   PLT 444*  --   --  313  --   --  306  --   HEPARINUNFRC  --   --    < > 0.39   < > 0.36 0.38 0.12*  CREATININE 1.44*  --   --  1.22  --   --  1.18 1.14  CKTOTAL  --  198  --   --   --   --   --   --    < > = values in this interval not displayed.    Estimated Creatinine Clearance: 104.4 mL/min (by C-G formula based on SCr of 1.14 mg/dL).   Assessment: 50 yo M found to have a pulmonary embolus in R lower lobe on CTA and an acute LLE DVT on doppler imaging. Pharmacy asked to start IV heparin. No AC noted PTA. CBC wnl.  Heparin level down to subtherapeutic (0.12) on 1600 units/hr. No bleeding or issue with line infusing noted.   Goal of Therapy:  Heparin level 0.3-0.7 units/ml Monitor platelets by anticoagulation protocol: Yes   Plan:  Rebolus heparin 3000 units Increase heparin gtt to 1900 units/hr F/u 6 hr heparin level  44, PharmD, BCPS Please see amion for complete clinical pharmacist phone list 05/21/2020 2:57 AM

## 2020-05-21 NOTE — Plan of Care (Signed)
  Problem: Education: Goal: Knowledge of General Education information will improve Description: Including pain rating scale, medication(s)/side effects and non-pharmacologic comfort measures 05/21/2020 1807 by Ross Ludwig, LPN Outcome: Progressing 05/21/2020 1302 by Ross Ludwig, LPN Outcome: Progressing

## 2020-05-21 NOTE — Progress Notes (Signed)
    Subjective:    Patient reports pain as 3 on 0-10 scale.   Denies CP or SOB.  Voiding without difficulty. Positive flatus. Objective: Vital signs in last 24 hours: Temp:  [99.1 F (37.3 C)] 99.1 F (37.3 C) (02/07 0527) Pulse Rate:  [86-88] 88 (02/07 0527) Resp:  [18] 18 (02/07 0527) BP: (122-141)/(77-90) 122/90 (02/07 0527) SpO2:  [95 %-97 %] 95 % (02/07 0527)  Intake/Output from previous day: 02/06 0701 - 02/07 0700 In: 375.6 [I.V.:375.6] Out: 850 [Urine:850] Intake/Output this shift: No intake/output data recorded.  Labs: Recent Labs    05/19/20 0619 05/20/20 0113 05/21/20 0123  HGB 12.6* 11.7* 11.4*   Recent Labs    05/20/20 0113 05/21/20 0123  WBC 8.5 8.1  RBC 3.94* 3.85*  HCT 35.6* 34.3*  PLT 306 278   Recent Labs    05/20/20 0113 05/21/20 0123  NA 132* 132*  K 3.8 4.1  CL 98 100  CO2 22 20*  BUN 11 9  CREATININE 1.18 1.14  GLUCOSE 110* 95  CALCIUM 8.8* 8.6*   No results for input(s): LABPT, INR in the last 72 hours.  Physical Exam: Neurologically intact Dorsiflexion/Plantar flexion intact Body mass index is 38.43 kg/m.   Assessment/Plan:    Patient admitted with left LE DVT. No sob/cp at present Currently on IV heparin drip Recommend formal PT eval: this will promote and improve mobility  Agree with ted stocking to aid in swelling No new recommendations from the spine standpoint - remains stable. Plan per vascular/medical team  Alvy Beal for Dr. Venita Lick [facility] [phone] 05/21/2020, 12:45 PM

## 2020-05-21 NOTE — Progress Notes (Signed)
Pt has home CPAP at bedside, can place on himself when ready for bed. Advised pt to notify for RT if any further assistance is needed.

## 2020-05-21 NOTE — Plan of Care (Signed)
  Problem: Education: Goal: Knowledge of General Education information will improve Description Including pain rating scale, medication(s)/side effects and non-pharmacologic comfort measures Outcome: Progressing   

## 2020-05-21 NOTE — Evaluation (Signed)
Physical Therapy Evaluation Patient Details Name: Jason Moore MRN: [id number] DOB: 1970/06/18 Today's Date: 05/21/2020   History of Present Illness  Pt is a 50 y.o. M with significant PMH of HTN, who was to have a L5-S1 fusion lact Dec but suffered a tear in the left common iliac vein, which was repaired and fusion was aborted. On 1/19, pt had lumbar spinal fusion surgery which was completed without complication. Pt presents now with LLE DVT. CTA chest showed a small right sided PE.  Clinical Impression  Pt presents with decreased functional mobility secondary to pain, LLE weakness, decreased ROM, and decreased endurance. Pt ambulating 60 ft with a walker and min guard assist. SpO2 97% on RA, HR peak 110 bpm. Education provided regarding HEP and activity recommendations. Suspect good progress with appropriate pain control/management. Will continue gait and stair training prior to discharge home.    Follow Up Recommendations Outpatient PT    Equipment Recommendations  None recommended by PT    Recommendations for Other Services       Precautions / Restrictions Precautions Precautions: Back Precaution Booklet Issued: No Required Braces or Orthoses: Spinal Brace Spinal Brace: Lumbar corset;Applied in sitting position Restrictions Weight Bearing Restrictions: No      Mobility  Bed Mobility Overal bed mobility: Needs Assistance Bed Mobility: Rolling;Sidelying to Sit;Sit to Sidelying Rolling: Modified independent (Device/Increase time) Sidelying to sit: Supervision     Sit to sidelying: Min assist General bed mobility comments: Cues for log roll technique, use of bed rails, increased time/effort. Assist for LE's back into bed    Transfers Overall transfer level: Needs assistance Equipment used: Rolling walker (2 wheeled) Transfers: Sit to/from Stand Sit to Stand: Supervision            Ambulation/Gait Ambulation/Gait assistance: Min guard Gait Distance (Feet): 60  Feet Assistive device: Rolling walker (2 wheeled) Gait Pattern/deviations: Decreased stride length;Step-to pattern;Antalgic Gait velocity: Decreased   General Gait Details: Cues for sequencing, heavy use of arms on walker, min guard for safety  Stairs            Wheelchair Mobility    Modified Rankin (Stroke Patients Only)       Balance Overall balance assessment: Needs assistance Sitting-balance support: Feet supported Sitting balance-Leahy Scale: Good     Standing balance support: Bilateral upper extremity supported Standing balance-Leahy Scale: Poor Standing balance comment: reliant on external support                             Pertinent Vitals/Pain Pain Assessment: 0-10 Pain Score: 6  Pain Location: LLE Pain Descriptors / Indicators: Grimacing;Guarding Pain Intervention(s): Monitored during session    Home Living Family/patient expects to be discharged to:: Private residence Living Arrangements: Spouse/significant other Available Help at Discharge: Family;Available 24 hours/day Type of Home: House Home Access: Stairs to enter Entrance Stairs-Rails: Right;Left;Can reach both Entrance Stairs-Number of Steps: 3-5 Home Layout: One level Home Equipment: Toilet riser;Shower seat;Walker - 2 wheels      Prior Function Level of Independence: Independent with assistive device(s)         Comments: Using walker since spinal sx     Hand Dominance        Extremity/Trunk Assessment   Upper Extremity Assessment Upper Extremity Assessment: Overall WFL for tasks assessed    Lower Extremity Assessment Lower Extremity Assessment: LLE deficits/detail LLE Deficits / Details: Recent DVT, Grossly 2/5 proximally, 3/5 distally    Cervical /  Trunk Assessment Cervical / Trunk Assessment: Other exceptions Cervical / Trunk Exceptions: s/p spinal surgery  Communication   Communication: No difficulties  Cognition Arousal/Alertness:  Awake/alert Behavior During Therapy: WFL for tasks assessed/performed Overall Cognitive Status: Within Functional Limits for tasks assessed                                        General Comments      Exercises General Exercises - Lower Extremity Ankle Circles/Pumps: Both;20 reps;Supine Long Arc Quad: Both;5 reps;Seated   Assessment/Plan    PT Assessment Patient needs continued PT services  PT Problem List Decreased strength;Decreased range of motion;Decreased activity tolerance;Decreased balance;Decreased mobility;Pain       PT Treatment Interventions DME instruction;Balance training;Gait training;Stair training;Functional mobility training;Patient/family education;Therapeutic activities;Therapeutic exercise    PT Goals (Current goals can be found in the Care Plan section)  Acute Rehab PT Goals Patient Stated Goal: pain control PT Goal Formulation: With patient/family Time For Goal Achievement: 06/04/20 Potential to Achieve Goals: Good    Frequency Min 3X/week   Barriers to discharge        Co-evaluation               AM-PAC PT "6 Clicks" Mobility  Outcome Measure Help needed turning from your back to your side while in a flat bed without using bedrails?: None Help needed moving from lying on your back to sitting on the side of a flat bed without using bedrails?: None Help needed moving to and from a bed to a chair (including a wheelchair)?: None Help needed standing up from a chair using your arms (e.g., wheelchair or bedside chair)?: None Help needed to walk in hospital room?: A Little Help needed climbing 3-5 steps with a railing? : A Little 6 Click Score: 22    End of Session   Activity Tolerance: Patient tolerated treatment well Patient left: in bed;with call bell/phone within reach;with family/visitor present Nurse Communication: Mobility status PT Visit Diagnosis: Pain;Difficulty in walking, not elsewhere classified (R26.2) Pain -  Right/Left: Left Pain - part of body: Leg    Time: 1523-1600 PT Time Calculation (min) (ACUTE ONLY): 37 min   Charges:   PT Evaluation $PT Eval Moderate Complexity: 1 Mod PT Treatments $Gait Training: 8-22 mins        Lillia Pauls, PT, DPT Acute Rehabilitation Services Pager [phone] Office [phone]   Norval Morton 05/21/2020, 5:24 PM

## 2020-05-22 LAB — CBC
HCT: 36.3 % — ABNORMAL LOW (ref 39.0–52.0)
Hemoglobin: 12.1 g/dL — ABNORMAL LOW (ref 13.0–17.0)
MCH: 30 pg (ref 26.0–34.0)
MCHC: 33.3 g/dL (ref 30.0–36.0)
MCV: 90.1 fL (ref 80.0–100.0)
Platelets: 332 10*3/uL (ref 150–400)
RBC: 4.03 MIL/uL — ABNORMAL LOW (ref 4.22–5.81)
RDW: 13.6 % (ref 11.5–15.5)
WBC: 7.5 10*3/uL (ref 4.0–10.5)
nRBC: 0 % (ref 0.0–0.2)

## 2020-05-22 LAB — HEPARIN LEVEL (UNFRACTIONATED): Heparin Unfractionated: 0.5 IU/mL (ref 0.30–0.70)

## 2020-05-22 MED ORDER — APIXABAN 5 MG PO TABS
10.0000 mg | ORAL_TABLET | Freq: Two times a day (BID) | ORAL | Status: DC
  Administered 2020-05-22 – 2020-05-23 (×3): 10 mg via ORAL
  Filled 2020-05-22 (×3): qty 2

## 2020-05-22 MED ORDER — APIXABAN 5 MG PO TABS: 5.0000 mg | ORAL_TABLET | Freq: Two times a day (BID) | ORAL | Status: DC

## 2020-05-22 NOTE — Evaluation (Addendum)
Occupational Therapy Evaluation/Discharge Patient Details Name: Jason Moore MRN: [id number] DOB: November 25, 1970 Today's Date: 05/22/2020    History of Present Illness Pt is a 50 y.o. M with significant PMH of HTN, who was to have a L5-S1 fusion lact Dec but suffered a tear in the left common iliac vein, which was repaired and fusion was aborted. On 1/19, pt had lumbar spinal fusion surgery which was completed without complication. Pt presents now with LLE DVT. CTA chest showed a small right sided PE.   Clinical Impression   PTA, pt lives with spouse (who works from home) and has recently been using RW for mobility since spinal surgery. Wife has been assisting with LB ADLs as needed after spinal surgery due to precautions and pt difficulty in crossing LEs for tasks. Pt limited primarily by pain and decreased endurance though progressing well. Pt able to demonstrate hallway distance mobility using RW at Supervision level, no LOB and VSS on RA. Pt Independent with UB ADLs including brace mgmt and continues to require Min A for LB ADLs (secondary to pain primarily). Collaborated on positioning strategies to trial for LE elevation and minimizing pain. Wife reports feeling comfortable continuing to assist pt with these tasks at home. Pt hopes for pain to subside and return to normal functioning soon. Pt has all needed DME. No further skilled OT services indicated at this time. OT to sign off. Please reconsult if needs change.    Follow Up Recommendations  No OT follow up;Supervision - Intermittent    Equipment Recommendations  None recommended by OT    Recommendations for Other Services       Precautions / Restrictions Precautions Precautions: Back Precaution Booklet Issued: No Precaution Comments: Reviewed back precautions. Patient able to recall 3/3 precautions from previous surgery. Required Braces or Orthoses: Spinal Brace Spinal Brace: Lumbar corset;Applied in sitting  position Restrictions Weight Bearing Restrictions: No      Mobility Bed Mobility               General bed mobility comments: sitting EOB on entry    Transfers Overall transfer level: Needs assistance Equipment used: Rolling walker (2 wheeled) Transfers: Sit to/from Stand Sit to Stand: Supervision         General transfer comment: No assist required to power-up to full stand. No unsteadiness or LOB noted.    Balance Overall balance assessment: Needs assistance Sitting-balance support: Feet supported Sitting balance-Leahy Scale: Good     Standing balance support: Bilateral upper extremity supported Standing balance-Leahy Scale: Poor Standing balance comment: reliant on external support                           ADL either performed or assessed with clinical judgement   ADL Overall ADL's : Needs assistance/impaired Eating/Feeding: Independent;Sitting   Grooming: Modified independent;Standing   Upper Body Bathing: Independent;Sitting   Lower Body Bathing: Minimal assistance;Sit to/from stand   Upper Body Dressing : Independent;Sitting Upper Body Dressing Details (indicate cue type and reason): able to manage back brace and gown seated and in standing Lower Body Dressing: Minimal assistance;Sit to/from stand Lower Body Dressing Details (indicate cue type and reason): Unable to cross LE for LB dressing. Wife has been assisting pt with socks and getting pants/underwear around feet. Toilet Transfer: Supervision/safety;Ambulation;RW;Comfort height toilet   Toileting- Clothing Manipulation and Hygiene: Modified independent;Sit to/from stand;Sitting/lateral lean       Functional mobility during ADLs: Supervision/safety;Rolling walker General ADL Comments: Limited  by pain, back precautions and decreased LE flexibility for LB ADLs though wife has been assisting pt with this at home and feels comfortable in doing so     Vision Patient Visual Report: No  change from baseline Vision Assessment?: No apparent visual deficits     Perception     Praxis      Pertinent Vitals/Pain Pain Assessment: Faces Faces Pain Scale: Hurts even more Pain Location: L LE at hip and back Pain Descriptors / Indicators: Grimacing;Guarding;Sharp;Shooting Pain Intervention(s): Monitored during session;Premedicated before session;Repositioned     Hand Dominance Right   Extremity/Trunk Assessment Upper Extremity Assessment Upper Extremity Assessment: Overall WFL for tasks assessed   Lower Extremity Assessment Lower Extremity Assessment: Defer to PT evaluation   Cervical / Trunk Assessment Cervical / Trunk Assessment: Other exceptions Cervical / Trunk Exceptions: s/p spinal surgery   Communication Communication Communication: No difficulties   Cognition Arousal/Alertness: Awake/alert Behavior During Therapy: WFL for tasks assessed/performed Overall Cognitive Status: Within Functional Limits for tasks assessed                                     General Comments  Wife present and supportive throughout, works from home and feels comfortable assisting pt at home. Pt reports pain with LE elevated and head upright - collaborated on positioning strategies to trial at home (has wedges they have been using in bed and pt sleeping in recliner as needed). Encouraged activity and elevation within pt tolerance. VSS on RA    Exercises     Shoulder Instructions      Home Living Family/patient expects to be discharged to:: Private residence Living Arrangements: Spouse/significant other Available Help at Discharge: Family;Available 24 hours/day Type of Home: House Home Access: Stairs to enter [employer] of Steps: 5-6 Entrance Stairs-Rails: Right;Left;Can reach both Home Layout: One level     Bathroom Shower/Tub: Producer, television/film/video: Handicapped height Bathroom Accessibility: No   Home Equipment: Toilet  riser;Shower seat;Walker - 2 wheels          Prior Functioning/Environment Level of Independence: Needs assistance  Gait / Transfers Assistance Needed: has been using RW for mobility since spinal surgery ADL's / Homemaking Assistance Needed: Wife assists with LB ADLs to don socks, pants around feet, etc after spinal surgery   Comments: 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, was Independent in all daily tasks, works Paramedic Problem List: Pain      OT Treatment/Interventions:      OT Goals(Current goals can be found in the care plan section) Acute Rehab OT Goals Patient Stated Goal: pain control OT Goal Formulation: With patient  OT Frequency:     Barriers to D/C:            Co-evaluation              AM-PAC OT "6 Clicks" Daily Activity     Outcome Measure Help from another person eating meals?: None Help from another person taking care of personal grooming?: None Help from another person toileting, which includes using toliet, bedpan, or urinal?: None Help from another person bathing (including washing, rinsing, drying)?: A Little Help from another person to put on and taking off regular upper body clothing?: None Help from another person to put on and taking off regular lower body clothing?: A Little 6 Click Score: 22   End of Session  Equipment Utilized During Treatment: Rolling walker;Back brace  Activity Tolerance: Patient tolerated treatment well Patient left: in chair;with call bell/phone within reach;with family/visitor present  OT Visit Diagnosis: Pain Pain - Right/Left: Left Pain - part of body: Hip;Leg (back)                Time: 1028-1100 OT Time Calculation (min): 32 min Charges:  OT General Charges $OT Visit: 1 Visit OT Evaluation $OT Eval Moderate Complexity: 1 Mod OT Treatments $Self Care/Home Management : 8-22 mins  Lorre Munroe, OTR/L  Lorre Munroe 05/22/2020, 11:36 AM

## 2020-05-22 NOTE — TOC Benefit Eligibility Note (Signed)
Transition of Care [facility]) Benefit Eligibility Note    Patient Details  Name: Jason Moore MRN: [id number] Date of Birth: December 19, 1970   Medication/Dose: eliquis  Spoke with Person/Company/Phone Number:: [facility]  Co-Pay: $0 for 30 day retail    Orson Aloe Phone Number: 05/22/2020, 12:58 PM

## 2020-05-22 NOTE — TOC Initial Note (Signed)
Transition of Care (TOC) - Initial/Assessment Note    Patient Details  Name: Jason Moore MRN: [id number] Date of Birth: 11/21/1970  Transition of Care (TOC) CM/SW Contact:    Wierda, Gregory Jon, LCSW Phone Number: 05/22/2020, 2:47 PM  Clinical Narrative:    CSW met with pt and wife Dori to discuss discharge plan.  Permission to speak with Dori given.  Pt agreeable to PT recommendation for outpt PT.  No equipment recommended and pt reports he has walker at home currently.  PCP in place.  Benefit check requested on eliquis: $0 copay, note in epic.                Expected Discharge Plan: Home/Self Care Barriers to Discharge: No Barriers Identified   Patient Goals and CMS Choice Patient states their goals for this hospitalization and ongoing recovery are:: "get back to 100%"      Expected Discharge Plan and Services. Expected Discharge Plan: Home/Self Care In-house Referral: Clinical Social Work   Post Acute Care Choice:  (outpt PT) Living arrangements for the past 2 months: Single Family Home                 DME Arranged: N/A DME Agency: NA       HH Arranged: NA HH Agency: NA        Prior Living Arrangements/Services Living arrangements for the past 2 months: Single Family Home Lives with:: Spouse Patient language and need for interpreter reviewed:: Yes Do you feel safe going back to the place where you live?: Yes      Need for Family Participation in Patient Care: No (Comment) Care giver support system in place?: Yes (comment) Current home services: Other (comment) (none) Criminal Activity/Legal Involvement Pertinent to Current Situation/Hospitalization: No - Comment as needed  Activities of Daily Living      Permission Sought/Granted Permission sought to share information with : Family Supports Permission granted to share information with : Yes, Verbal Permission Granted  Share Information with NAME: wife Dori           Emotional  Assessment Appearance:: Appears stated age Attitude/Demeanor/Rapport: Engaged Affect (typically observed): Appropriate,Pleasant Orientation: : Oriented to Self,Oriented to Place,Oriented to  Time,Oriented to Situation Alcohol / Substance Use: Not Applicable Psych Involvement: No (comment)  Admission diagnosis:  Lactic acidosis [E87.2] Hyponatremia [E87.1] Post-operative pain [G89.18] Acute pulmonary embolism (HCC) [I26.99] Acute deep vein thrombosis (DVT) of left lower extremity, unspecified vein (HCC) [I82.402] Other acute pulmonary embolism, unspecified whether acute cor pulmonale present (HCC) [I26.99] Patient Active Problem List   Diagnosis Date Noted  . Acute pulmonary embolism (HCC) 05/18/2020  . DVT (deep venous thrombosis) (HCC) 05/18/2020  . OSA (obstructive sleep apnea) 05/18/2020  . Fusion of lumbar spine 05/02/2020  . Degenerative disc disease at L5-S1 level 03/21/2020  . Hypertension 02/24/2020  . BPH with obstruction/lower urinary tract symptoms 05/12/2018  . Erectile dysfunction due to arterial insufficiency 05/12/2018  . Family history of prostate cancer in father 05/12/2018  . Constipation 12/14/2013  . Incomplete emptying of bladder 12/14/2013  . Organic impotence 12/14/2013  . Thrombosed external hemorrhoid 12/14/2013   PCP:  Sun, Vyvyan, MD Pharmacy:   [id number] [place], [place] - [address] [place] [address] Phone: [phone] Fax: [phone]     Social Determinants of Health (SDOH) Interventions    Readmission Risk Interventions No flowsheet data found.  

## 2020-05-22 NOTE — Discharge Instructions (Signed)
Information on my medicine - ELIQUIS (apixaban)  This medication education was reviewed with me or my healthcare representative as part of my discharge preparation.    Why was Eliquis prescribed for you? Eliquis was prescribed to treat blood clots that may have been found in the veins of your legs (deep vein thrombosis) or in your lungs (pulmonary embolism) and to reduce the risk of them occurring again.  What do You need to know about Eliquis ? The starting dose is 10 mg (two 5 mg tablets) taken TWICE daily for the FIRST SEVEN (7) DAYS, then on (enter date)  05/29/20  the dose is reduced to ONE 5 mg tablet taken TWICE daily.  Eliquis may be taken with or without food.   Try to take the dose about the same time in the morning and in the evening. If you have difficulty swallowing the tablet whole please discuss with your pharmacist how to take the medication safely.  Take Eliquis exactly as prescribed and DO NOT stop taking Eliquis without talking to the doctor who prescribed the medication.  Stopping may increase your risk of developing a new blood clot.  Refill your prescription before you run out.  After discharge, you should have regular check-up appointments with your healthcare provider that is prescribing your Eliquis.    What do you do if you miss a dose? If a dose of ELIQUIS is not taken at the scheduled time, take it as soon as possible on the same day and twice-daily administration should be resumed. The dose should not be doubled to make up for a missed dose.  Important Safety Information A possible side effect of Eliquis is bleeding. You should call your healthcare provider right away if you experience any of the following: ? Bleeding from an injury or your nose that does not stop. ? Unusual colored urine (red or dark brown) or unusual colored stools (red or black). ? Unusual bruising for unknown reasons. ? A serious fall or if you hit your head (even if there is no  bleeding).  Some medicines may interact with Eliquis and might increase your risk of bleeding or clotting while on Eliquis. To help avoid this, consult your healthcare provider or pharmacist prior to using any new prescription or non-prescription medications, including herbals, vitamins, non-steroidal anti-inflammatory drugs (NSAIDs) and supplements.  This website has more information on Eliquis (apixaban): http://www.eliquis.com/eliquis/home

## 2020-05-22 NOTE — Progress Notes (Addendum)
[facility]  Subjective  - He has improved motor function now that he is wearing the thigh high compression per patient.   Objective 126/80 75 99.3 F (37.4 C) 17 98%  Intake/Output Summary (Last 24 hours) at 05/22/2020 8182 Last data filed at 05/22/2020 9937 Gross per 24 hour  Intake -  Output 2175 ml  Net -2175 ml    Left LE compartments feel softer to palpation Thigh high compression on left LE Lungs non labored wearing Cpap for sleep Heart RRR  Assessment/Planning: 50 year old male with extensive left leg DVT including iliac vein involvementin the setting ofrecent iliac vein injury at the caval bifurcation during attempted anterior L5-S1 spine exposure on 03/21/20. I suspect his DVT is related to that venous injury and then additional recent spine surgery.  CTA chest showed a small right sided pulmonary embolism.  No sign of symptoms.  Non labored breathing.  O2 SAT 98 %.  OK to start DOAC of choice and have PCP follow as OP. F/U with DR. Rohaan Durnil as an OP he will schedule.   Mosetta Pigeon 05/22/2020 7:06 AM --  Laboratory Lab Results: Recent Labs    05/21/20 0123 05/22/20 0209  WBC 8.1 7.5  HGB 11.4* 12.1*  HCT 34.3* 36.3*  PLT 278 332   BMET Recent Labs    05/20/20 0113 05/21/20 0123  NA 132* 132*  K 3.8 4.1  CL 98 100  CO2 22 20*  GLUCOSE 110* 95  BUN 11 9  CREATININE 1.18 1.14  CALCIUM 8.8* 8.6*    COAG Lab Results  Component Value Date   INR 1.1 04/30/2020   INR 1.1 03/19/2020   No results found for: PTT  I have seen and evaluated the patient. I agree with the PA note as documented above.  50 year old male with extensive left leg DVT in the setting of recent iliac vein injury at the caval bifurcation during an L5-S1 ALIF with Dr. Arbie Cookey.  His left leg has made some improvement through the weekend and he is tolerating heparin.  I think he can be switched to a DOAC.  He now has a thigh-high compression  stocking.  Discussed with him and his wife again would avoid intervention if possible given his recent iliac vein injury.  Cephus Shelling, MD [facility] Office: [phone]

## 2020-05-22 NOTE — Progress Notes (Signed)
ANTICOAGULATION CONSULT NOTE  Pharmacy Consult:  Heparin >> Eliquis Indication: DVT and PE  Allergies  Allergen Reactions  . Tape Other (See Comments)    Fabric-like tape left red bumps on the skin after it was removed  . Other Other (See Comments)    Seasonal allergies- Itchy eyes, runny nose, etc..    Patient Measurements: Height: 5\' 11"  (180.3 cm) Weight: 125 kg (275 lb 9.2 oz) IBW/kg (Calculated) : 75.3 Heparin Dosing Weight: 103.4 kg  Vital Signs:    Labs: Recent Labs    05/20/20 0113 05/21/20 0123 05/21/20 0939 05/21/20 1726 05/22/20 0209  HGB 11.7* 11.4*  --   --  12.1*  HCT 35.6* 34.3*  --   --  36.3*  PLT 306 278  --   --  332  HEPARINUNFRC 0.38 0.12* 0.36 0.42 0.50  CREATININE 1.18 1.14  --   --   --     Estimated Creatinine Clearance: 104.4 mL/min (by C-G formula based on SCr of 1.14 mg/dL).   Assessment: 51 yo M found to have a pulmonary embolus in R lower lobe on CTA and an acute LLE DVT on doppler imaging.  Pharmacy consulted to transition from IV heparin to Eliquis.  Patient tolerates full anticoagulation; no bleeding reported.  Goal of Therapy:  Appropriate anticoagulation Monitor platelets by anticoagulation protocol: Yes   Plan:  D/C heparin when first dose of Eliquis is administered Eliquis 10mg  PO BID x 7 days, then on 05/29/20 start 5mg  PO BID Pharmacy will sign off and follow peripherally.  Thank you for the consult!  Alta Shober D. , PharmD, BCPS, BCCCP 05/22/2020, 8:53 AM

## 2020-05-22 NOTE — Progress Notes (Signed)
PROGRESS NOTE  Jason Moore QBH:[id number] DOB: 09-02-1970 DOA: 05/18/2020 PCP: [name], MD  HPI/Recap of past 24 hours: HPI from Dr Tawni Levy is a 50 y.o. M with hx HTN, OSA on CPAP and recent lumbar fusion who presents with acute left leg swelling. Patient has chronic degenerative back disease, and was to have L5-S1 fusion last Dec, from an anterior approach, but suffered a tear in the left common iliac vein, which was repaired, and the fusion was aborted. On 1/19 pt had lumbar spinal fusion surgery which was completed then without complication. For the past day or so, pt started noticing worsening swelling and discomfort in his left leg, therefore presented to the ED. In the ER, he had HR 136, and swelling in the left thigh without loss of distal pulse.  US of the left leg showed extensive left DVT.  CTA chest showed a small right sided pulmonary embolism.  Lactic acid elevated but no signs or symptoms to suggest infection.  Patient admitted for further management.    Today, pt denies any new complaints      Assessment/Plan: Principal Problem:   Acute pulmonary embolism (HCC) Active Problems:   Hypertension   Degenerative disc disease at L5-S1 level   DVT (deep venous thrombosis) (HCC)   OSA (obstructive sleep apnea)   Acute extensive LLE DVT Acute pulmonary embolism Likely provoked s/p surgery Vascular surgeon on board, continue IV heparin for now due to recent left iliac vein repair S/P IV Heparin, switched to PO eliquis on 05/22/20  Elevated lactic acidosis Resolved Unlikely due to sepsis/infection Possibly due to above  Hypertension Continue nebivolol  Recent lumbar spinal fusion Continue pain management  OSA Continue CPAP at night  Obesity Lifestyle modification advised       Malnutrition Type:      Malnutrition Characteristics:      Nutrition Interventions:       Estimated body mass index is 38.43 kg/m as calculated  from the following:   Height as of this encounter: 5\' 11"  (1.803 m).   Weight as of this encounter: 125 kg.     Code Status: Full  Family Communication: Discussed with wife at bedside on 05/22/20  Disposition Plan: Status is: Inpatient  Remains inpatient appropriate because:Inpatient level of care appropriate due to severity of illness   Dispo: The patient is from: Home              Anticipated d/c is to: Home              Anticipated d/c date is: 1 day              Patient currently is medically stable to d/c.   Difficult to place patient No   Consultants:  Vascular surgery  Orthopedic surgery  Procedures:  None  Antimicrobials:  None  DVT prophylaxis: Eliquis   Objective: Vitals:   05/20/20 2115 05/21/20 0527 05/21/20 1606 05/22/20 1521  BP: (!) 141/77 122/90 126/80 118/78  Pulse: 86 88 75 70  Resp: 18 18 17 20   Temp:  99.1 F (37.3 C) 99.3 F (37.4 C) 99 F (37.2 C)  TempSrc:  Oral    SpO2: 97% 95% 98% 99%  Weight:      Height:        Intake/Output Summary (Last 24 hours) at 05/22/2020 1624 Last data filed at 05/22/2020 0819 Gross per 24 hour  Intake 557.87 ml  Output 2175 ml  Net -1617.13 ml  Filed Weights   05/18/20 0450  Weight: 125 kg    Exam:  General: NAD   Cardiovascular: S1, S2 present  Respiratory: CTAB  Abdomen: Soft, nontender, nondistended, bowel sounds present  Musculoskeletal: 1+ LLE edema wrapped with ACE wrap, pulses present, able to move his foot  Skin: Normal  Psychiatry: Normal mood    Data Reviewed: CBC: Recent Labs  Lab 05/18/20 0454 05/19/20 0619 05/20/20 0113 05/21/20 0123 05/22/20 0209  WBC 11.3* 9.2 8.5 8.1 7.5  NEUTROABS 9.0*  --   --   --   --   HGB 13.0 12.6* 11.7* 11.4* 12.1*  HCT 41.5 37.6* 35.6* 34.3* 36.3*  MCV 92.4 90.4 90.4 89.1 90.1  PLT 444* 313 306 278 332   Basic Metabolic Panel: Recent Labs  Lab 05/18/20 0454 05/19/20 0619 05/20/20 0113 05/21/20 0123  NA 132* 132* 132* 132*   K 4.0 4.2 3.8 4.1  CL 95* 100 98 100  CO2 21* 21* 22 20*  GLUCOSE 143* 115* 110* 95  BUN 14 12 11 9   CREATININE 1.44* 1.22 1.18 1.14  CALCIUM 9.5 8.9 8.8* 8.6*   GFR: Estimated Creatinine Clearance: 104.4 mL/min (by C-G formula based on SCr of 1.14 mg/dL). Liver Function Tests: Recent Labs  Lab 05/18/20 0454  AST 27  ALT 30  ALKPHOS 81  BILITOT 0.8  PROT 7.4  ALBUMIN 3.9   No results for input(s): LIPASE, AMYLASE in the last 168 hours. No results for input(s): AMMONIA in the last 168 hours. Coagulation Profile: No results for input(s): INR, PROTIME in the last 168 hours. Cardiac Enzymes: Recent Labs  Lab 05/18/20 0814  CKTOTAL 198   BNP (last 3 results) No results for input(s): PROBNP in the last 8760 hours. HbA1C: No results for input(s): HGBA1C in the last 72 hours. CBG: No results for input(s): GLUCAP in the last 168 hours. Lipid Profile: No results for input(s): CHOL, HDL, LDLCALC, TRIG, CHOLHDL, LDLDIRECT in the last 72 hours. Thyroid Function Tests: No results for input(s): TSH, T4TOTAL, FREET4, T3FREE, THYROIDAB in the last 72 hours. Anemia Panel: No results for input(s): VITAMINB12, FOLATE, FERRITIN, TIBC, IRON, RETICCTPCT in the last 72 hours. Urine analysis:    Component Value Date/Time   COLORURINE YELLOW 05/18/2020 0450   APPEARANCEUR CLEAR 05/18/2020 0450   LABSPEC 1.020 05/18/2020 0450   PHURINE 5.0 05/18/2020 0450   GLUCOSEU NEGATIVE 05/18/2020 0450   HGBUR NEGATIVE 05/18/2020 0450   BILIRUBINUR NEGATIVE 05/18/2020 0450   KETONESUR NEGATIVE 05/18/2020 0450   PROTEINUR NEGATIVE 05/18/2020 0450   NITRITE NEGATIVE 05/18/2020 0450   LEUKOCYTESUR NEGATIVE 05/18/2020 0450   Sepsis Labs: @LABRCNTIP (procalcitonin:4,lacticidven:4)  ) Recent Results (from the past 240 hour(s))  SARS Coronavirus 2 by RT PCR (hospital order, performed in [facility] hospital lab) Nasopharyngeal Nasopharyngeal Swab     Status: None   Collection Time: 05/18/20  8:50  AM   Specimen: Nasopharyngeal Swab  Result Value Ref Range Status   SARS Coronavirus 2 NEGATIVE NEGATIVE Final    Comment: (NOTE) SARS-CoV-2 target nucleic acids are NOT DETECTED.  The SARS-CoV-2 RNA is generally detectable in upper and lower respiratory specimens during the acute phase of infection. The lowest concentration of SARS-CoV-2 viral copies this assay can detect is 250 copies / mL. A negative result does not preclude SARS-CoV-2 infection and should not be used as the sole basis for treatment or other patient management decisions.  A negative result may occur with improper specimen collection / handling, submission of specimen other  than nasopharyngeal swab, presence of viral mutation(s) within the areas targeted by this assay, and inadequate number of viral copies (<250 copies / mL). A negative result must be combined with clinical observations, patient history, and epidemiological information.  Fact Sheet for Patients:   BoilerBrush.com.cy  Fact Sheet for Healthcare Providers: https://pope.com/  This test is not yet approved or  cleared by the [place] FDA and has been authorized for detection and/or diagnosis of SARS-CoV-2 by FDA under an Emergency Use Authorization (EUA).  This EUA will remain in effect (meaning this test can be used) for the duration of the COVID-19 declaration under Section 564(b)(1) of the Act, 21 U.S.C. section 360bbb-3(b)(1), unless the authorization is terminated or revoked sooner.  Performed at [facility], [address], [place], [place] [address]   Blood culture (routine x 2)     Status: None (Preliminary result)   Collection Time: 05/18/20  1:10 PM   Specimen: BLOOD  Result Value Ref Range Status   Specimen Description BLOOD SITE NOT SPECIFIED  Final   Special Requests   Final    BOTTLES DRAWN AEROBIC AND ANAEROBIC Blood Culture adequate volume   Culture   Final    NO  GROWTH 4 DAYS Performed at [facility], [address] [place], [place] [address]    Report Status PENDING  Incomplete  Blood culture (routine x 2)     Status: None (Preliminary result)   Collection Time: 05/18/20  6:30 PM   Specimen: BLOOD RIGHT ARM  Result Value Ref Range Status   Specimen Description BLOOD RIGHT ARM  Final   Special Requests   Final    BOTTLES DRAWN AEROBIC AND ANAEROBIC Blood Culture adequate volume   Culture   Final    NO GROWTH 4 DAYS Performed at [facility], [address], [place], [place] [address]    Report Status PENDING  Incomplete      Studies: No results found.  Scheduled Meds: . apixaban  10 mg Oral BID   Followed by  . [START ON 05/29/2020] apixaban  5 mg Oral BID  . gabapentin  300 mg Oral TID  . nebivolol  5 mg Oral Daily  . polyethylene glycol  17 g Oral BID  . senna-docusate  1 tablet Oral BID    Continuous Infusions:    LOS: 4 days     Briant Cedar, MD [facility]  If 7PM-7AM, please contact night-coverage [email or web address] 05/22/2020, 4:24 PM

## 2020-05-23 ENCOUNTER — Inpatient Hospital Stay (HOSPITAL_COMMUNITY): Payer: BC Managed Care – PPO

## 2020-05-23 ENCOUNTER — Other Ambulatory Visit: Payer: Self-pay | Admitting: Internal Medicine

## 2020-05-23 DIAGNOSIS — I82402 Acute embolism and thrombosis of unspecified deep veins of left lower extremity: Secondary | ICD-10-CM

## 2020-05-23 DIAGNOSIS — M5137 Other intervertebral disc degeneration, lumbosacral region: Secondary | ICD-10-CM

## 2020-05-23 LAB — CBC WITH DIFFERENTIAL/PLATELET
Abs Immature Granulocytes: 0.02 10*3/uL (ref 0.00–0.07)
Basophils Absolute: 0.1 10*3/uL (ref 0.0–0.1)
Basophils Relative: 1 %
Eosinophils Absolute: 0.4 10*3/uL (ref 0.0–0.5)
Eosinophils Relative: 6 %
HCT: 36.2 % — ABNORMAL LOW (ref 39.0–52.0)
Hemoglobin: 11.5 g/dL — ABNORMAL LOW (ref 13.0–17.0)
Immature Granulocytes: 0 %
Lymphocytes Relative: 23 %
Lymphs Abs: 1.4 10*3/uL (ref 0.7–4.0)
MCH: 30.3 pg (ref 26.0–34.0)
MCHC: 31.8 g/dL (ref 30.0–36.0)
MCV: 95.3 fL (ref 80.0–100.0)
Monocytes Absolute: 0.6 10*3/uL (ref 0.1–1.0)
Monocytes Relative: 10 %
Neutro Abs: 3.6 10*3/uL (ref 1.7–7.7)
Neutrophils Relative %: 60 %
Platelets: 293 10*3/uL (ref 150–400)
RBC: 3.8 MIL/uL — ABNORMAL LOW (ref 4.22–5.81)
RDW: 13.7 % (ref 11.5–15.5)
WBC: 6 10*3/uL (ref 4.0–10.5)
nRBC: 0 % (ref 0.0–0.2)

## 2020-05-23 LAB — CULTURE, BLOOD (ROUTINE X 2)
Culture: NO GROWTH
Culture: NO GROWTH
Special Requests: ADEQUATE
Special Requests: ADEQUATE

## 2020-05-23 MED ORDER — ACETAMINOPHEN 325 MG PO TABS
650.0000 mg | ORAL_TABLET | Freq: Four times a day (QID) | ORAL | Status: DC | PRN
  Administered 2020-05-23: 650 mg via ORAL
  Filled 2020-05-23: qty 2

## 2020-05-23 MED ORDER — APIXABAN 5 MG PO TABS: 10.0000 mg | ORAL_TABLET | Freq: Two times a day (BID) | ORAL | 0 refills | Status: DC

## 2020-05-23 MED ORDER — APIXABAN 5 MG PO TABS: 5.0000 mg | ORAL_TABLET | Freq: Two times a day (BID) | ORAL | 0 refills | Status: DC

## 2020-05-23 MED ORDER — KETOROLAC TROMETHAMINE 30 MG/ML IJ SOLN
30.0000 mg | Freq: Three times a day (TID) | INTRAMUSCULAR | Status: DC | PRN
  Administered 2020-05-23: 30 mg via INTRAVENOUS
  Filled 2020-05-23: qty 1

## 2020-05-23 MED FILL — ELIQUIS 5 MG TABLET: 5 | 32 days supply | Qty: 74 | Fill #0

## 2020-05-23 NOTE — Progress Notes (Signed)
Physical Therapy Treatment Patient Details Name: Jason Moore MRN: [id number] DOB: Dec 27, 1970 Today's Date: 05/23/2020    History of Present Illness Pt is a 50 y.o. M with significant PMH of HTN, who was to have a L5-S1 fusion last Dec but suffered a tear in the left common iliac vein, which was repaired and fusion was aborted. On 1/19, pt had lumbar spinal fusion surgery which was completed without complication. Pt presents now with LLE DVT. CTA chest showed a small right sided PE.    PT Comments    Pt admitted with above diagnosis. Pt was able to ascend and descend stairs today with bil rails with supervision with wife present. Wife feels comfortable assisting pt at home. Issued gait belt.  Pt still limited by left hip pain, however is able to mobilize well. Discussed with wife regarding need for Outpt PT f/u and she says she has appt.  Will continue to follow while in hospital. Pt currently with functional limitations due to balance and endurance deficits. Pt will benefit from skilled PT to increase their independence and safety with mobility to allow discharge to the venue listed below.     Follow Up Recommendations  Outpatient PT     Equipment Recommendations  Other (comment) (Issued gait belt)    Recommendations for Other Services       Precautions / Restrictions Precautions Precautions: Back Precaution Booklet Issued: No Precaution Comments: Reviewed back precautions. Patient able to recall 3/3 precautions from previous surgery. Required Braces or Orthoses: Spinal Brace Spinal Brace: Lumbar corset;Applied in sitting position Restrictions Weight Bearing Restrictions: No    Mobility  Bed Mobility               General bed mobility comments: sitting EOB on entry  Transfers Overall transfer level: Needs assistance Equipment used: Rolling walker (2 wheeled) Transfers: Sit to/from Stand Sit to Stand: Supervision         General transfer comment: No assist  required to power-up to full stand. No unsteadiness or LOB noted.  Ambulation/Gait Ambulation/Gait assistance: Min guard Gait Distance (Feet): 180 Feet Assistive device: Rolling walker (2 wheeled) Gait Pattern/deviations: Decreased stride length;Step-to pattern;Antalgic;Trunk flexed Gait velocity: Decreased   General Gait Details: Cues for sequencing, heavy use of arms on walker, min guard for safety   Stairs Stairs: Yes Stairs assistance: Min guard;Supervision Stair Management: Step to pattern;Forwards;Two rails Number of Stairs: 6 General stair comments: VC's for sequencing and general safety.   Wheelchair Mobility    Modified Rankin (Stroke Patients Only)       Balance Overall balance assessment: Needs assistance Sitting-balance support: Feet supported Sitting balance-Leahy Scale: Good     Standing balance support: Bilateral upper extremity supported Standing balance-Leahy Scale: Poor Standing balance comment: reliant on bil UEs                            Cognition Arousal/Alertness: Awake/alert Behavior During Therapy: WFL for tasks assessed/performed Overall Cognitive Status: Within Functional Limits for tasks assessed                                        Exercises      General Comments General comments (skin integrity, edema, etc.): Pt donned brace with independence.Wife present. Issued gait belt.  Discussed Outpt PT with wife as pt is to start first of March.  Pertinent Vitals/Pain Pain Assessment: Faces Faces Pain Scale: Hurts worst Pain Location: L LE at hip and back Pain Descriptors / Indicators: Grimacing;Guarding;Sharp;Shooting Pain Intervention(s): Limited activity within patient's tolerance;Monitored during session;Repositioned    Home Living                      Prior Function            PT Goals (current goals can now be found in the care plan section) Acute Rehab PT Goals Patient Stated Goal:  pain control Progress towards PT goals: Progressing toward goals    Frequency    Min 3X/week      PT Plan Current plan remains appropriate    Co-evaluation              AM-PAC PT "6 Clicks" Mobility   Outcome Measure  Help needed turning from your back to your side while in a flat bed without using bedrails?: None Help needed moving from lying on your back to sitting on the side of a flat bed without using bedrails?: None Help needed moving to and from a bed to a chair (including a wheelchair)?: None Help needed standing up from a chair using your arms (e.g., wheelchair or bedside chair)?: None Help needed to walk in hospital room?: A Little Help needed climbing 3-5 steps with a railing? : A Little 6 Click Score: 22    End of Session Equipment Utilized During Treatment: Back brace Activity Tolerance: Patient tolerated treatment well Patient left: with call bell/phone within reach;with family/visitor present;in chair Nurse Communication: Mobility status PT Visit Diagnosis: Pain;Difficulty in walking, not elsewhere classified (R26.2) Pain - Right/Left: Left Pain - part of body: Leg     Time: 0935-1005 PT Time Calculation (min) (ACUTE ONLY): 30 min  Charges:  $Gait Training: 23-37 mins                     Marcelline Temkin W,PT Acute Rehabilitation Services Pager:  [phone]  Office:  [phone]     Berline Lopes 05/23/2020, 10:24 AM

## 2020-05-23 NOTE — Progress Notes (Signed)
Pt. Placed self on his home CPAP tonight. VSS, RT continue to monitor.

## 2020-05-23 NOTE — Discharge Summary (Signed)
Physician Discharge Summary  Jason Moore L Selsor Jason Moore:[id number] DOB: 15-Jun-1970 DOA: 05/18/2020  PCP: Deatra JamesSun, Vyvyan, MD  Admit date: 05/18/2020 Discharge date: 05/23/2020  Admitted From: Home Disposition:  Home  Recommendations for Outpatient Follow-up:  1. Follow up with PCP in 1-2 weeks  Discharge Condition:Stable CODE STATUS:Full Diet recommendation: Heart healthy   Brief/Interim Summary: 50 y.o.Mwith hx HTN, OSA on CPAP and recent lumbar fusionwho presents with acute left leg swelling. Patient has chronic degenerative back disease, and was to have L5-S1 fusion last Dec, from an anterior approach, but suffered a tear in the left common iliac vein, which was repaired, and the fusion was aborted. On 1/19 pt had lumbar spinal fusion surgery which was completed then without complication. For the past day or so, pt started noticing worsening swelling and discomfort in his left leg, therefore presented to the ED. In the ER, he had HR 136, and swelling in the left thigh without loss of distal pulse. US of the left leg showed extensive left DVT. CTA chest showed a small right sided pulmonary embolism. Lactic acid elevated but no signs or symptoms to suggest infection.  Patient admitted for further management.  Discharge Diagnoses:  Principal Problem:   Acute pulmonary embolism (HCC) Active Problems:   Hypertension   Degenerative disc disease at L5-S1 level   DVT (deep venous thrombosis) (HCC)   OSA (obstructive sleep apnea)   Acute extensive LLE DVT Acute pulmonary embolism Likely provoked s/p surgery Vascular surgeon on board, continue IV heparin for now due to recent left iliac vein repair S/P IV Heparin, switched to PO eliquis on 05/22/20  Elevated lactic acidosis Resolved Unlikely due to sepsis/infection Possibly due to above  Hypertension Continue nebivolol  Recent lumbar spinal fusion Continue pain management Complained of low back pain and L hip pain, Ordered xrays,  reviewed, negative.  Recommend pt to follow up with pt's primary Orthopedic Surgeon, Dr. Shon BatonBrooks  OSA Continue CPAP at night  Obesity Lifestyle modification advised  Discharge Instructions  Discharge Instructions    Ambulatory referral to Physical Therapy   Complete by: As directed      Allergies as of 05/23/2020      Reactions   Tape Other (See Comments)   Fabric-like tape left red bumps on the skin after it was removed   Other Other (See Comments)   Seasonal allergies- Itchy eyes, runny nose, etc..      Medication List    TAKE these medications   apixaban 5 MG Tabs tablet Commonly known as: ELIQUIS Take 2 tablets (10 mg total) by mouth 2 (two) times daily for 5 days.   apixaban 5 MG Tabs tablet Commonly known as: ELIQUIS Take 1 tablet (5 mg total) by mouth 2 (two) times daily. Start taking on: May 29, 2020   escitalopram 5 MG tablet Commonly known as: LEXAPRO Take 5 mg by mouth daily.   gabapentin 300 MG capsule Commonly known as: NEURONTIN Take 300 mg by mouth 3 (three) times daily.   methocarbamol 500 MG tablet Commonly known as: ROBAXIN Take 500 mg by mouth 3 (three) times daily.   nebivolol 5 MG tablet Commonly known as: BYSTOLIC Take 5 mg by mouth daily.   ondansetron 4 MG tablet Commonly known as: Zofran Take 1 tablet (4 mg total) by mouth every 8 (eight) hours as needed for nausea or vomiting.   oxyCODONE-acetaminophen 10-325 MG tablet Commonly known as: PERCOCET Take 1 tablet by mouth 3 (three) times daily.  Follow-up Information    Early, Kristen Loader, MD Follow up in 3 week(s).   Specialties: Vascular Surgery, Cardiology Contact information: [address] [place] [address] [phone]        [facility] Follow up.   Specialty: Rehabilitation Why: The office will call you to schedule your first physical therapy appointment.  Please call them if you have not heard anything by the second day  after discharge. Contact information: [address] [place], [address] [id number] mc [place] [address] [phone]       [name], MD. Schedule an appointment as soon as possible for a visit in 2 week(s).   Specialty: Family Medicine Contact information: [phone] [address] [place] [address] [phone]        Venita Lick, MD. Schedule an appointment as soon as possible for a visit.   Specialty: Orthopedic Surgery Contact information: [address] [place] [address] [phone]              Allergies  Allergen Reactions  . Tape Other (See Comments)    Fabric-like tape left red bumps on the skin after it was removed  . Other Other (See Comments)    Seasonal allergies- Itchy eyes, runny nose, etc..    Consultations:  Vascular Surgery  Procedures/Studies: DG Lumbar Spine 2-3 Views  Result Date: 05/23/2020 CLINICAL DATA:  Increasing low back pain radiating into the left hip EXAM: LUMBAR SPINE - 3 VIEW COMPARISON:  05/02/2020 FINDINGS: Postsurgical changes are seen at L5-S1 with pedicle screw placement and interbody fusion. The overall appearance is stable. Vertebral body height is well maintained. No acute bony abnormality is seen. IMPRESSION: Stable postoperative changes as described. Electronically Signed   By: Alcide Clever M.D.   On: 05/23/2020 12:41   DG Lumbar Spine 2-3 Views  Result Date: 05/02/2020 CLINICAL DATA:  L5-S1 TLIF EXAM: DG C-ARM 1-60 MIN; LUMBAR SPINE - 2-3 VIEW COMPARISON:  02/08/2009 lumbar spine radiographs FLUOROSCOPY TIME:  Fluoroscopy Time:  2 minutes 47 seconds Radiation Exposure Index (if provided by the fluoroscopic device): Sub 144 mGy Number of Acquired Spot Images: 2 FINDINGS: Nondiagnostic spot fluoroscopic intraoperative lateral and AP views of the lumbosacral junction demonstrate bilateral pedicle screws at L5 and S1 with interbody spacer in the L5-S1 disc space. IMPRESSION:  Intraoperative fluoroscopic guidance for L5-S1 fusion. Electronically Signed   By: Delbert Phenix M.D.   On: 05/02/2020 13:07   CT Angio Chest PE W and/or Wo Contrast  Result Date: 05/18/2020 CLINICAL DATA:  Tachycardia. Lumbar surgery a week ago. High probability DVT in the left lower extremity. PE suspected. EXAM: CT ANGIOGRAPHY CHEST WITH CONTRAST TECHNIQUE: Multidetector CT imaging of the chest was performed using the standard protocol during bolus administration of intravenous contrast. Multiplanar CT image reconstructions and MIPs were obtained to evaluate the vascular anatomy. CONTRAST:  15mL OMNIPAQUE IOHEXOL 350 MG/ML SOLN COMPARISON:  None. FINDINGS: Cardiovascular: The thoracic aorta is normal in caliber without atherosclerosis or dissection. The heart size is normal. No coronary artery calcifications are identified. There is respiratory motion with stairstep artifact in the bases limiting evaluation for more peripheral branches. Within these limitations, there are no definite emboli seen on the left. There is a pulmonary embolus in the medial right lower lobe on axial images 174 through 178. No other definitive emboli are seen on the right. No central emboli are seen on the right or left. Mediastinum/Nodes: No enlarged mediastinal, hilar, or axillary lymph nodes. Thyroid  gland, trachea, and esophagus demonstrate no significant findings. Lungs/Pleura: A small nodule on the right on series 7, image 48 is in close proximity to the fissure, possibly but not definitely an intra fissural node measuring up to 5 mm. No other nodules or masses. No focal infiltrates. The central airways are normal. No pneumothorax. Upper Abdomen: No acute abnormality. Musculoskeletal: No chest wall abnormality. No acute or significant osseous findings. Review of the MIP images confirms the above findings. IMPRESSION: 1. Single pulmonary embolus in the right lower lobe. 2. 5 mm nodule in the right lung. No follow-up needed if  patient is low-risk. Non-contrast chest CT can be considered in 12 months if patient is high-risk. This recommendation follows the consensus statement: Guidelines for Management of Incidental Pulmonary Nodules Detected on CT Images: From the Fleischner Society 2017; Radiology 2017; 284:228-243. Findings were called to Dr. Blane Ohara in the emergency room. Electronically Signed   By: Gerome Sam III M.D   On: 05/18/2020 11:08   DG Chest Portable 1 View  Result Date: 05/18/2020 CLINICAL DATA:  Shortness of breath.  Left low back pain. EXAM: PORTABLE CHEST 1 VIEW COMPARISON:  None. FINDINGS: Heart size is normal. No edema or effusion is present. No focal airspace disease is evident. IMPRESSION: Negative one view chest x-ray. Electronically Signed   By: Marin Roberts M.D.   On: 05/18/2020 09:36   DG C-Arm 1-60 Min  Result Date: 05/02/2020 CLINICAL DATA:  L5-S1 TLIF EXAM: DG C-ARM 1-60 MIN; LUMBAR SPINE - 2-3 VIEW COMPARISON:  02/08/2009 lumbar spine radiographs FLUOROSCOPY TIME:  Fluoroscopy Time:  2 minutes 47 seconds Radiation Exposure Index (if provided by the fluoroscopic device): Sub 144 mGy Number of Acquired Spot Images: 2 FINDINGS: Nondiagnostic spot fluoroscopic intraoperative lateral and AP views of the lumbosacral junction demonstrate bilateral pedicle screws at L5 and S1 with interbody spacer in the L5-S1 disc space. IMPRESSION: Intraoperative fluoroscopic guidance for L5-S1 fusion. Electronically Signed   By: Delbert Phenix M.D.   On: 05/02/2020 13:07   DG HIP UNILAT WITH PELVIS 1V LEFT  Result Date: 05/23/2020 CLINICAL DATA:  Left hip pain EXAM: DG HIP (WITH OR WITHOUT PELVIS) 1V*L* COMPARISON:  None. FINDINGS: There is no evidence of hip fracture or dislocation. There is no evidence of arthropathy or other focal bone abnormality. Lumbar fusion with instrumentation has been performed, not optimally assessed on this exam. IMPRESSION: Negative. Electronically Signed   By: Helyn Numbers  MD   On: 05/23/2020 12:45   VAS [place] LOWER EXTREMITY VENOUS (DVT) (ONLY [facility] & [facility] 7a-7p)  Result Date: 05/18/2020  Lower Venous DVT Study Indications: Pain, and Edema.  Risk Factors: Surgery Back. Comparison Study: No previous Exam Performing Technologist: Clint Guy RVT  Examination Guidelines: A complete evaluation includes B-mode imaging, spectral Doppler, color Doppler, and power Doppler as needed of all accessible portions of each vessel. Bilateral testing is considered an integral part of a complete examination. Limited examinations for reoccurring indications may be performed as noted. The reflux portion of the exam is performed with the patient in reverse Trendelenburg.  +-----+---------------+---------+-----------+----------+--------------+ RIGHTCompressibilityPhasicitySpontaneityPropertiesThrombus Aging +-----+---------------+---------+-----------+----------+--------------+ CFV  Full           Yes      Yes                                 +-----+---------------+---------+-----------+----------+--------------+ SFJ  Full                                                        +-----+---------------+---------+-----------+----------+--------------+   +---------+---------------+---------+-----------+----------+--------------+  LEFT     CompressibilityPhasicitySpontaneityPropertiesThrombus Aging +---------+---------------+---------+-----------+----------+--------------+ CFV      None           No       No                   Acute          +---------+---------------+---------+-----------+----------+--------------+ SFJ      None                                         Acute          +---------+---------------+---------+-----------+----------+--------------+ FV Prox  None                                         Acute          +---------+---------------+---------+-----------+----------+--------------+ FV Mid   None                                         Acute           +---------+---------------+---------+-----------+----------+--------------+ FV DistalNone                                         Acute          +---------+---------------+---------+-----------+----------+--------------+ PFV      None                                         Acute          +---------+---------------+---------+-----------+----------+--------------+ POP      None           No       No                   Acute          +---------+---------------+---------+-----------+----------+--------------+ PTV      None                                         Acute          +---------+---------------+---------+-----------+----------+--------------+ PERO     None                                         Acute          +---------+---------------+---------+-----------+----------+--------------+  Summary: RIGHT: - No evidence of common femoral vein obstruction.  LEFT: - Findings consistent with acute deep vein thrombosis involving the left external iliac vein, common femoral vein, SF junction, left femoral vein, left proximal profunda vein, left popliteal vein, left posterior tibial veins, and left peroneal veins.   *See table(s) above for measurements and observations. Electronically signed by Sherald Hess MD on 05/18/2020 at 4:09:46 PM.    Final     Subjective: Eager to go home  Discharge Exam: Vitals:  05/22/20 2225 05/23/20 0552  BP: 134/81 121/74  Pulse: 78 68  Resp: 18 20  Temp: 98.7 F (37.1 C) 98.5 F (36.9 C)  SpO2: 100% 97%   Vitals:   05/21/20 1606 05/22/20 1521 05/22/20 2225 05/23/20 0552  BP: 126/80 118/78 134/81 121/74  Pulse: 75 70 78 68  Resp: 17 20 18 20   Temp: 99.3 F (37.4 C) 99 F (37.2 C) 98.7 F (37.1 C) 98.5 F (36.9 C)  TempSrc:      SpO2: 98% 99% 100% 97%  Weight:      Height:        General: Pt is alert, awake, not in acute distress Cardiovascular: RRR, S1/S2 +, no rubs, no gallops Respiratory: CTA bilaterally, no  wheezing, no rhonchi Abdominal: Soft, NT, ND, bowel sounds + Extremities: no edema, no cyanosis   The results of significant diagnostics from this hospitalization (including imaging, microbiology, ancillary and laboratory) are listed below for reference.     Microbiology: Recent Results (from the past 240 hour(s))  SARS Coronavirus 2 by RT PCR (hospital order, performed in [facility] hospital lab) Nasopharyngeal Nasopharyngeal Swab     Status: None   Collection Time: 05/18/20  8:50 AM   Specimen: Nasopharyngeal Swab  Result Value Ref Range Status   SARS Coronavirus 2 NEGATIVE NEGATIVE Final    Comment: (NOTE) SARS-CoV-2 target nucleic acids are NOT DETECTED.  The SARS-CoV-2 RNA is generally detectable in upper and lower respiratory specimens during the acute phase of infection. The lowest concentration of SARS-CoV-2 viral copies this assay can detect is 250 copies / mL. A negative result does not preclude SARS-CoV-2 infection and should not be used as the sole basis for treatment or other patient management decisions.  A negative result may occur with improper specimen collection / handling, submission of specimen other than nasopharyngeal swab, presence of viral mutation(s) within the areas targeted by this assay, and inadequate number of viral copies (<250 copies / mL). A negative result must be combined with clinical observations, patient history, and epidemiological information.  Fact Sheet for Patients:   07/16/20  Fact Sheet for Healthcare Providers: BoilerBrush.com.cy  This test is not yet approved or  cleared by the https://pope.com/ FDA and has been authorized for detection and/or diagnosis of SARS-CoV-2 by FDA under an Emergency Use Authorization (EUA).  This EUA will remain in effect (meaning this test can be used) for the duration of the COVID-19 declaration under Section 564(b)(1) of the Act, 21 U.S.C. section  360bbb-3(b)(1), unless the authorization is terminated or revoked sooner.  Performed at [facility], [address] [place], [place]   Blood culture (routine x 2)     Status: None   Collection Time: 05/18/20  1:10 PM   Specimen: BLOOD  Result Value Ref Range Status   Specimen Description BLOOD SITE NOT SPECIFIED  Final   Special Requests   Final    BOTTLES DRAWN AEROBIC AND ANAEROBIC Blood Culture adequate volume   Culture   Final    NO GROWTH 5 DAYS Performed at [facility], [address], [place], [place]    Report Status 05/23/2020 FINAL  Final  Blood culture (routine x 2)     Status: None   Collection Time: 05/18/20  6:30 PM   Specimen: BLOOD RIGHT ARM  Result Value Ref Range Status   Specimen Description BLOOD RIGHT ARM  Final   Special Requests   Final    BOTTLES  DRAWN AEROBIC AND ANAEROBIC Blood Culture adequate volume   Culture   Final    NO GROWTH 5 DAYS Performed at [facility] Lab, [address] [place], [place] [address]    Report Status 05/23/2020 FINAL  Final     Labs: BNP (last 3 results) No results for input(s): BNP in the last 8760 hours. Basic Metabolic Panel: Recent Labs  Lab 05/18/20 0454 05/19/20 0619 05/20/20 0113 05/21/20 0123  NA 132* 132* 132* 132*  K 4.0 4.2 3.8 4.1  CL 95* 100 98 100  CO2 21* 21* 22 20*  GLUCOSE 143* 115* 110* 95  BUN 14 12 11 9   CREATININE 1.44* 1.22 1.18 1.14  CALCIUM 9.5 8.9 8.8* 8.6*   Liver Function Tests: Recent Labs  Lab 05/18/20 0454  AST 27  ALT 30  ALKPHOS 81  BILITOT 0.8  PROT 7.4  ALBUMIN 3.9   No results for input(s): LIPASE, AMYLASE in the last 168 hours. No results for input(s): AMMONIA in the last 168 hours. CBC: Recent Labs  Lab 05/18/20 0454 05/19/20 0619 05/20/20 0113 05/21/20 0123 05/22/20 0209 05/23/20 0212  WBC 11.3* 9.2 8.5 8.1 7.5 6.0  NEUTROABS 9.0*  --   --   --   --  3.6  HGB 13.0 12.6* 11.7* 11.4* 12.1* 11.5*  HCT 41.5 37.6* 35.6*  34.3* 36.3* 36.2*  MCV 92.4 90.4 90.4 89.1 90.1 95.3  PLT 444* 313 306 278 332 293   Cardiac Enzymes: Recent Labs  Lab 05/18/20 0814  CKTOTAL 198   BNP: Invalid input(s): POCBNP CBG: No results for input(s): GLUCAP in the last 168 hours. D-Dimer No results for input(s): DDIMER in the last 72 hours. Hgb A1c No results for input(s): HGBA1C in the last 72 hours. Lipid Profile No results for input(s): CHOL, HDL, LDLCALC, TRIG, CHOLHDL, LDLDIRECT in the last 72 hours. Thyroid function studies No results for input(s): TSH, T4TOTAL, T3FREE, THYROIDAB in the last 72 hours.  Invalid input(s): FREET3 Anemia work up No results for input(s): VITAMINB12, FOLATE, FERRITIN, TIBC, IRON, RETICCTPCT in the last 72 hours. Urinalysis    Component Value Date/Time   COLORURINE YELLOW 05/18/2020 0450   APPEARANCEUR CLEAR 05/18/2020 0450   LABSPEC 1.020 05/18/2020 0450   PHURINE 5.0 05/18/2020 0450   GLUCOSEU NEGATIVE 05/18/2020 0450   HGBUR NEGATIVE 05/18/2020 0450   BILIRUBINUR NEGATIVE 05/18/2020 0450   KETONESUR NEGATIVE 05/18/2020 0450   PROTEINUR NEGATIVE 05/18/2020 0450   NITRITE NEGATIVE 05/18/2020 0450   LEUKOCYTESUR NEGATIVE 05/18/2020 0450   Sepsis Labs Invalid input(s): PROCALCITONIN,  WBC,  LACTICIDVEN Microbiology Recent Results (from the past 240 hour(s))  SARS Coronavirus 2 by RT PCR (hospital order, performed in [facility] hospital lab) Nasopharyngeal Nasopharyngeal Swab     Status: None   Collection Time: 05/18/20  8:50 AM   Specimen: Nasopharyngeal Swab  Result Value Ref Range Status   SARS Coronavirus 2 NEGATIVE NEGATIVE Final    Comment: (NOTE) SARS-CoV-2 target nucleic acids are NOT DETECTED.  The SARS-CoV-2 RNA is generally detectable in upper and lower respiratory specimens during the acute phase of infection. The lowest concentration of SARS-CoV-2 viral copies this assay can detect is 250 copies / mL. A negative result does not preclude SARS-CoV-2  infection and should not be used as the sole basis for treatment or other patient management decisions.  A negative result may occur with improper specimen collection / handling, submission of specimen other than nasopharyngeal swab, presence of viral mutation(s) within the areas  targeted by this assay, and inadequate number of viral copies (<250 copies / mL). A negative result must be combined with clinical observations, patient history, and epidemiological information.  Fact Sheet for Patients:   BoilerBrush.com.cy  Fact Sheet for Healthcare Providers: https://pope.com/  This test is not yet approved or  cleared by the [place] FDA and has been authorized for detection and/or diagnosis of SARS-CoV-2 by FDA under an Emergency Use Authorization (EUA).  This EUA will remain in effect (meaning this test can be used) for the duration of the COVID-19 declaration under Section 564(b)(1) of the Act, 21 U.S.C. section 360bbb-3(b)(1), unless the authorization is terminated or revoked sooner.  Performed at [facility], [address] [place], [place] [address]   Blood culture (routine x 2)     Status: None   Collection Time: 05/18/20  1:10 PM   Specimen: BLOOD  Result Value Ref Range Status   Specimen Description BLOOD SITE NOT SPECIFIED  Final   Special Requests   Final    BOTTLES DRAWN AEROBIC AND ANAEROBIC Blood Culture adequate volume   Culture   Final    NO GROWTH 5 DAYS Performed at [facility], [address] [place], [place] [address]    Report Status 05/23/2020 FINAL  Final  Blood culture (routine x 2)     Status: None   Collection Time: 05/18/20  6:30 PM   Specimen: BLOOD RIGHT ARM  Result Value Ref Range Status   Specimen Description BLOOD RIGHT ARM  Final   Special Requests   Final    BOTTLES DRAWN AEROBIC AND ANAEROBIC Blood Culture adequate volume   Culture   Final    NO GROWTH 5 DAYS Performed at  [facility], [address] [place], [place] [address]    Report Status 05/23/2020 FINAL  Final   Time spent: 30 min  SIGNED:   Rickey Barbara, MD  [facility] 05/23/2020, 7:03 PM  If 7PM-7AM, please contact night-coverage

## 2020-05-23 NOTE — TOC Transition Note (Signed)
Transition of Care [facility]) - CM/SW Discharge Note   Patient Details  Name: Jason Moore MRN: [id number] Date of Birth: 27-Jun-1970  Transition of Care [facility]) CM/SW Contact:  Lorri Frederick, LCSW Phone Number: 05/23/2020, 3:04 PM   Clinical Narrative: Pt discharging home with outpt PT ordered.  No equipment needs.  No medication assistance needs as eliquis covered by pt insurance with $0 copay.  Wife to transport home.  No other needs identified.        Final next level of care: Home/Self Care Barriers to Discharge: No Barriers Identified   Patient Goals and CMS Choice Patient states their goals for this hospitalization and ongoing recovery are:: "get back to 100%"      Discharge Placement                       Discharge Plan and Services In-house Referral: Clinical Social Work   Post Acute Care Choice:  (outpt PT)          DME Arranged: N/A DME Agency: NA       HH Arranged: NA HH Agency: NA        Social Determinants of Health (SDOH) Interventions     Readmission Risk Interventions No flowsheet data found.

## 2020-05-23 NOTE — Progress Notes (Signed)
Subjective:    Patient reports pain as moderate.  Describing pain in the left lateral leg into foot. Some pain into left groin as well. Right radicular leg pain that was present pre-op has improved. No CP or SOB. Voiding well without difficulty. Positive flatus. Denies fevers/sweats/chills.   Objective: Vital signs in last 24 hours: Temp:  [98.5 F (36.9 C)-99 F (37.2 C)] 98.5 F (36.9 C) (02/09 0552) Pulse Rate:  [68-78] 68 (02/09 0552) Resp:  [18-20] 20 (02/09 0552) BP: (118-134)/(74-81) 121/74 (02/09 0552) SpO2:  [97 %-100 %] 97 % (02/09 0552)  Intake/Output from previous day: 02/08 0701 - 02/09 0700 In: 557.9 [I.V.:557.9] Out: 350 [Urine:350] Intake/Output this shift: No intake/output data recorded.  Recent Labs    05/21/20 0123 05/22/20 0209 05/23/20 0212  HGB 11.4* 12.1* 11.5*   Recent Labs    05/22/20 0209 05/23/20 0212  WBC 7.5 6.0  RBC 4.03* 3.80*  HCT 36.3* 36.2*  PLT 332 293   Recent Labs    05/21/20 0123  NA 132*  K 4.1  CL 100  CO2 20*  BUN 9  CREATININE 1.14  GLUCOSE 95  CALCIUM 8.6*   No results for input(s): LABPT, INR in the last 72 hours.  Neurologically intact ABD soft Sensation intact distally Intact pulses distally Dorsiflexion/Plantar flexion intact Incision: well healed Continues to have left LE swelling, significantly improved.   Assessment/Plan:    Patient admitted with left LE DVT s/p lumbar TLIF His main complaint is left leg pain. This does seem to follow a L5/S1 dermatome pattern. Some of his leg pain is certainly due to the blood clot, but I cannot r/o nerve irritation may be contributing as well. We can do a further work-up on this as an out-patient. Will discuss further with Dr. Shon Baton. Continue Gabapentin 300 mg TID.   Patient scheduled to be discharged today on a DOAC. He has his LSO brace and has post-op PT set up as well. Also has out patient vascular f/u scheduled. He is scheduled to see [place] again in early March  for routine post-op, we will likely move this appointment up given his significant left leg which some of which may be attributed to nerve irritation.   We will continue to follow the patient once he is discharged from an out-patient spine standpoint.   Leonette Monarch Ward 05/23/2020, 3:11 PM

## 2020-05-23 NOTE — Progress Notes (Signed)
[facility]  Subjective  -left leg a bit softer. Having a much of pain in the hip that he states started before his DVT after his first spine surgery.   Objective 121/74 68 98.5 F (36.9 C) 20 97%  Intake/Output Summary (Last 24 hours) at 05/23/2020 0913 Last data filed at 05/23/2020 0344 Gross per 24 hour  Intake --  Output 350 ml  Net -350 ml    Left leg softer with thigh-high compression and palpable DP pulse, no signs of phlegmasia  Laboratory Lab Results: Recent Labs    05/22/20 0209 05/23/20 0212  WBC 7.5 6.0  HGB 12.1* 11.5*  HCT 36.3* 36.2*  PLT 332 293   BMET Recent Labs    05/21/20 0123  NA 132*  K 4.1  CL 100  CO2 20*  GLUCOSE 95  BUN 9  CREATININE 1.14  CALCIUM 8.6*    COAG Lab Results  Component Value Date   INR 1.1 04/30/2020   INR 1.1 03/19/2020   No results found for: PTT  Assessment/Planning:  50 year old male with extensive left leg DVT in the setting of recent iliac vein injury during a L5-S1 ALIF. He has been transitioned to Eliquis and I think his calf and thigh are softer over the weekend. No signs of phlegmasia. Again emphasized conservative management with the family today and can be discharged with thigh-high compression. I will arrange follow-up in 2 weeks to check on his leg in the office. He is having a bunch of pain in the hip that he states started prior to his DVT and during his first spine surgery. It is not clear to me that this is related to the DVT.  Cephus Shelling 05/23/2020 9:13 AM --

## 2020-05-25 DIAGNOSIS — I2699 Other pulmonary embolism without acute cor pulmonale: Secondary | ICD-10-CM | POA: Diagnosis not present

## 2020-05-28 ENCOUNTER — Telehealth: Payer: Self-pay

## 2020-05-28 NOTE — Telephone Encounter (Signed)
Patient called to report some swelling in his left leg, DVT diagnosed last week post ortho pedic surgery. The swelling was decreasing and has now increased somewhat. Patient is on Eliquis and wearing compression stockings. He denies color or temperature change and is able to freely move his leg. Advised patient that swelling was normal - to keep follow up appt with [facility]. Knows to call back if severe pain, color or temperature change develops or he is unable to move leg.

## 2020-05-29 ENCOUNTER — Other Ambulatory Visit: Payer: Self-pay

## 2020-05-29 ENCOUNTER — Ambulatory Visit: Payer: BC Managed Care – PPO | Attending: Internal Medicine | Admitting: Physical Therapy

## 2020-05-29 ENCOUNTER — Telehealth (HOSPITAL_COMMUNITY): Payer: Self-pay

## 2020-05-29 ENCOUNTER — Encounter: Payer: Self-pay | Admitting: Physical Therapy

## 2020-05-29 DIAGNOSIS — R262 Difficulty in walking, not elsewhere classified: Secondary | ICD-10-CM

## 2020-05-29 DIAGNOSIS — M6281 Muscle weakness (generalized): Secondary | ICD-10-CM | POA: Diagnosis not present

## 2020-05-29 DIAGNOSIS — M79605 Pain in left leg: Secondary | ICD-10-CM

## 2020-05-29 NOTE — Patient Instructions (Signed)
Access Code: [email or web address] URL: [email or web address] Date: 05/29/2020 Prepared by: Lavinia Sharps  Exercises Supine Transversus Abdominis Bracing - Hands on Stomach - 1 x daily - 7 x weekly - 1 sets - 10 reps Supine Sciatic Nerve Glide - 1 x daily - 7 x weekly - 1 sets - 10 reps Standing Heel Raise with Support - 1 x daily - 7 x weekly - 1 sets - 10 reps Heel Toe Raises with Counter Support - 1 x daily - 7 x weekly - 1 sets - 10 reps Side to Side Weight Shift with Unilateral Counter Support - 1 x daily - 7 x weekly - 1 sets - 10 reps

## 2020-05-29 NOTE — Telephone Encounter (Signed)
Pharmacy Transitions of Care Follow-up Telephone Call  Date of discharge: 05/23/20  Discharge Diagnosis: DVT  How have you been since you were released from the hospital? Patient is doing well/ Knows s/sx of bleeding to watch out for.    Medication changes made at discharge: yes   Medication changes obtained and verified? yes    Medication Accessibility:  Home Pharmacy: [facility]  Was the patient provided with refills on discharged medications? Yes but provider already sent maintenance dose of Eliquis to home pharmacy  Have all prescriptions been transferred from [facility] to home pharmacy? no  . Is the patient able to afford medications? yes    Medication Review:  APIXABEN (ELIQUIS)  Apixaban 10 mg BID on 05/23/20, will start eliquis 5mg  BID 7 days later - Discussed importance of taking medication around the same time everyday  - Advised patient of medications to avoid (NSAIDs, ASA)  - Educated that Tylenol (acetaminophen) will be the preferred analgesic to prevent risk of bleeding  - Emphasized importance of monitoring for signs and symptoms of bleeding (abnormal bruising, prolonged bleeding, nose bleeds, bleeding from gums, discolored urine, black tarry stools)  - Advised patient to alert all providers of anticoagulation therapy prior to starting a new medication or having a procedure   Follow-up Appointments:  Already had f/u with orthopedics on 05/28/20, Dr. 05/30/20 sent eliquis maintainence refills to home pharmacy. Has f/u again with him on 06/12/20  If their condition worsens, is the pt aware to call PCP or go to the Emergency Dept.? yes  Final Patient Assessment: Patient doing well, knows s/sx of bleeding to watch out for. Has follow ups scheduled and refills taken care of.

## 2020-05-29 NOTE — Therapy (Signed)
[facility] [address], [address] [place], [place], [address] Phone: [phone]   Fax:  [phone]  Physical Therapy Evaluation  Patient Details  Name: Jason Moore MRN: [id number] Date of Birth: [birth year] Referring Provider (PT): Dr. Elodia Florence Early   Encounter Date: 05/29/2020   PT End of Session - 05/29/20 1925    Visit Number 1    Date for PT Re-Evaluation 07/24/20    Authorization Type BCBS    PT Start Time 1400    PT Stop Time 1445    PT Time Calculation (min) 45 min    Equipment Utilized During Treatment Back brace    Activity Tolerance Patient tolerated treatment well    Behavior During Therapy [facility] for tasks assessed/performed           Past Medical History:  Diagnosis Date  . Anxiety   . Back pain   . Degeneration of lumbar intervertebral disc   . Hypertension   . Isthmic spondylolisthesis   . Joint pain   . Lumbar radiculopathy   . Numbness and tingling   . Sleep apnea    cpap @ [facility]    Past Surgical History:  Procedure Laterality Date  . ABDOMINAL EXPOSURE N/A 03/21/2020   Procedure: ABDOMINAL EXPOSURE;  Surgeon: Larina Earthly, MD;  Location: [facility] OR;  Service: Vascular;  Laterality: N/A;  . ANTERIOR LUMBAR FUSION N/A 03/21/2020   Procedure: ATTEMPTED ANTERIOR LUMBAR FUSION (ALIF) LUMBAR FIVE- SACRAL ONE, POSTERIOR SPINAL FUSION INTERBODY LUMBAR FIVE-SACRAL ONE;  Surgeon: Venita Lick, MD;  Location: [facility] OR;  Service: Orthopedics;  Laterality: N/A;  5 HRS Dr. Arbie Cookey to do approach Tap block with exparel  . SHOULDER ARTHROSCOPY  04/15/2007  . TRANSFORAMINAL LUMBAR INTERBODY FUSION (TLIF) WITH PEDICLE SCREW FIXATION 1 LEVEL N/A 05/02/2020   Procedure: TRANSFORAMINAL LUMBAR INTERBODY FUSION (TLIF) LUMBAR 5-SACRAL 1;  Surgeon: Venita Lick, MD;  Location: [facility] OR;  Service: Orthopedics;  Laterality: N/A;  4.5 hrs    There were no vitals filed for this visit.    Subjective Assessment -  05/29/20 1403    Subjective Feb 4th went to hospital for blood clot; hospitalized for 6 days for Heparin.  Back surgery for cage fusion but vein damage /bleeding Dec 8th.  Jan 19th posterior fusion L5-S1.  Still having nerve pain down to foot on left.  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 had right LE symptoms.  Referred for strength in leg left.  Using RW full time since January surgery.   Seated ankle pumps, LAQ, marching    Pertinent History RESTRICTIONS NO LIFT >5#, NO BEND,NO TWIST; NO LIFT OVERHEAD;  Ortho Dr. Venita Lick 06/12/20; Vascular end of Feb    Limitations Walking;Standing;House hold activities    How long can you sit comfortably? it hurts even sitting    How long can you walk comfortably? 5 min 3x/day    Patient Stated Goals out of brace, off walker, walk for 5 minutes without pain    Currently in Pain? Yes    Pain Score 2     Pain Location Back    Pain Orientation Left    Pain Type Surgical pain    Pain Onset More than a month ago    Pain Frequency Constant    Aggravating Factors  bending over; twist; heavy lifting              [facility] PT Assessment - 05/29/20 0001      Assessment   Medical Diagnosis acute DVT  left LE    Referring Provider (PT) Dr. Brock RaEzenduka/Dr. Tawanna Coolerodd Early    Next MD Visit Feb 28    Prior Therapy for LBP prior to surgery at [facility] (mostly aquatic)      Precautions   Precautions Back    Required Braces or Orthoses Spinal Brace   when out of bed but not in bed     Restrictions   Weight Bearing Restrictions No      Balance Screen   Has the patient fallen in the past 6 months No    Has the patient had a decrease in activity level because of a fear of falling?  No    Is the patient reluctant to leave their home because of a fear of falling?  No      Home Environment   Living Environment Private residence    Living Arrangements Spouse/significant other    Type of Home House    Home Access Stairs to enter    Entrance Stairs-Number of Steps 5    Entrance  Stairs-Rails Right    Home Layout One level    Home Equipment Walker - 2 wheels    Additional Comments compression hose; sock aide; reacher; long shoe horn; shower bench      Prior Function   Level of Independence Independent with household mobility with device    Vocation --   can work from home   [employer] Requirements sell car body parts    Leisure puzzles; working out      AROM   Overall AROM Comments LEs WFLs; spinal ROM not tested secondary to fusion precautions      Strength   Overall Strength Comments Spinal strength not formally tested secondary to surgical precautions; needs UE assist with sit to stand    Right Hip Flexion 4/5    Right Hip ABduction 4-/5    Left Hip Flexion 4/5    Left Hip ABduction 4-/5    Right Knee Flexion 4/5    Right Knee Extension 4-/5    Left Knee Flexion 4/5    Left Knee Extension 4-/5    Right Ankle Dorsiflexion 4/5    Right Ankle Plantar Flexion 4/5    Right Ankle Inversion 4/5    Right Ankle Eversion 4/5    Left Ankle Dorsiflexion 4/5    Left Ankle Plantar Flexion 4/5    Left Ankle Inversion 4/5    Left Ankle Eversion 4/5      Slump test   Findings Negative      Bed Mobility   Bed Mobility --   excellent demo of log rolling     Ambulation/Gait   Assistive device Rolling walker    Gait Comments dec gait speed      6 Minute Walk- Baseline   6 Minute Walk- Baseline yes   to be done next visit     Timed Up and Go Test   TUG Comments to be done next visit                      Objective measurements completed on examination: See above findings.               PT Education - 05/29/20 1924    Education Details supine transverse abdominus activation; supine neural glide; standing heel raises, toe raises; standing weight shift side to side    Person(s) Educated Patient    Methods Explanation;Demonstration;Handout    Comprehension Returned demonstration;Verbalized understanding  PT Short Term  Goals - 05/29/20 1941      PT SHORT TERM GOAL #1   Title The patient will demonstrate knowledge of and compliance with initial HEP    Time 4    Period Weeks    Status New      PT SHORT TERM GOAL #2   Title The patient will be able to walk 8-10 min with RW needed for community mobility    Time 4    Period Weeks    Status New      PT SHORT TERM GOAL #3   Title The patient will be able to rise from a standard chair with minimal use of UE assist    Time 4    Period Weeks    Status New      PT SHORT TERM GOAL #4   Title Get baseline TUG and 6 min walk tests    Time 4    Period Weeks    Status New             PT Long Term Goals - 05/29/20 1945      PT LONG TERM GOAL #1   Title The patient will be independent with safe self progression of HEP    Time 8    Period Weeks    Status New      PT LONG TERM GOAL #2   Title The patient will report a 40% improvement in  left LE pain with home ADLs and return to work duties    Time 8    Period Weeks    Status New      PT LONG TERM GOAL #3   Title The patient will be able to ambulate household distances with single point cane    Time 8    Period Weeks    Status New      PT LONG TERM GOAL #4   Title Improved gait speed indicated by TUG time and 6 min walk time tests (to be set next visit)    Time 8    Period Weeks    Status New      PT LONG TERM GOAL #5   Title Bil LE strength improved to 4/5 to 4+/5 needed to ascend /descend steps at home, ease with sit to stand and to lift/carry light objects at home    Time 8    Period Weeks    Status New                  Plan - 05/29/20 1926    Clinical Impression Statement 50 y.o. male presenting with progressive neuropathic pain and dysesthesia s/p L5 and S1 TLIF on 1/19 by Dr. Shon Baton. PMHx significant for attempted L5-S1 ALIF on 12/8 which resulted in bleeding and inability to complete planned cage fusion.  In addition, the patient was recently hospitalized for 6 days as a  result of a left LE DVT.  He is referred to PT by the hospitalist and will follow up with Dr. Arbie Cookey and Dr. Shon Baton on 2/28 and 3/1.    He reports prior to surgery he had right LE symptoms and since surgery his symptoms are in the left LE.   He is wearing a lumbar corset anytime when out of bed and is using a RW for limited ambulation of 5 minutes duration.  He is unable to work at this time.  He needs his wife's assistance with some ADLs.  Decreased LE strength grossly 4/5  although knee extensors and hip abductors 4-/5 bilaterally.  He requires UE assist with sit to stand.  He demonstrates good technique with leg rolling on the clinic table.  He would benefit from PT to address limitations in functional mobility, gait, and gait.    Personal Factors and Comorbidities Comorbidity 1;Comorbidity 2;Comorbidity 3+;Time since onset of injury/illness/exacerbation    Comorbidities DDD, HTN, sleep apnea; recent left LE DVT (on blood thinner); 2 recent surgeries: 12/8 and posterior lumbar fusion 1/19 with no lift >5#, no bending, no twisting    Examination-Activity Limitations Locomotion Level;Transfers;Lift;Hygiene/Grooming;Dressing;Stairs;Stand;Squat;Carry    Examination-Participation Restrictions Meal Prep;Occupation;Community Activity;Driving;Other    Stability/Clinical Decision Making Evolving/Moderate complexity    Clinical Decision Making Moderate    Rehab Potential Good    PT Frequency 2x / week    PT Duration 8 weeks    PT Treatment/Interventions ADLs/Self Care Home Management;Aquatic Therapy;Cryotherapy;Electrical Stimulation;Moist Heat;Neuromuscular re-education;Therapeutic exercise;Therapeutic activities;Gait training;Stair training;Patient/family education;Manual techniques;Taping    PT Next Visit Plan check TUG and 6 min walk test; review initial HEP and add hip hinge with sit to stand and low level standing ex's    PT Home Exercise Plan [email or web address]    Consulted and Agree with Plan of Care Patient            Patient will benefit from skilled therapeutic intervention in order to improve the following deficits and impairments:  Decreased range of motion,Difficulty walking,Decreased activity tolerance,Impaired perceived functional ability,Pain,Decreased strength,Decreased mobility,Impaired flexibility  Visit Diagnosis: Muscle weakness (generalized) - Plan: PT plan of care cert/re-cert  Difficulty in walking, not elsewhere classified - Plan: PT plan of care cert/re-cert  Pain in left leg - Plan: PT plan of care cert/re-cert     Problem List Patient Active Problem List   Diagnosis Date Noted  . Acute pulmonary embolism (HCC) 05/18/2020  . DVT (deep venous thrombosis) (HCC) 05/18/2020  . OSA (obstructive sleep apnea) 05/18/2020  . Fusion of lumbar spine 05/02/2020  . Degenerative disc disease at L5-S1 level 03/21/2020  . Hypertension 02/24/2020  . BPH with obstruction/lower urinary tract symptoms 05/12/2018  . Erectile dysfunction due to arterial insufficiency 05/12/2018  . Family history of prostate cancer in father 05/12/2018  . Constipation 12/14/2013  . Incomplete emptying of bladder 12/14/2013  . Organic impotence 12/14/2013  . Thrombosed external hemorrhoid 12/14/2013   Lavinia Sharps, PT 05/29/20 7:54 PM Phone: [phone] Fax: [phone] Vivien Presto 05/29/2020, 7:54 PM  [facility] [address] [place], [place], [address] Phone: [phone]   Fax:  [phone]  Name: Jason Moore MRN: [id number] Date of Birth: 11/18/1970

## 2020-05-31 ENCOUNTER — Other Ambulatory Visit: Payer: Self-pay

## 2020-05-31 ENCOUNTER — Ambulatory Visit: Payer: BC Managed Care – PPO | Admitting: Physical Therapy

## 2020-05-31 DIAGNOSIS — R262 Difficulty in walking, not elsewhere classified: Secondary | ICD-10-CM | POA: Diagnosis not present

## 2020-05-31 DIAGNOSIS — M79605 Pain in left leg: Secondary | ICD-10-CM

## 2020-05-31 DIAGNOSIS — M6281 Muscle weakness (generalized): Secondary | ICD-10-CM | POA: Diagnosis not present

## 2020-05-31 NOTE — Therapy (Signed)
[facility] [address], [address] [place], [place], [address] Phone: [phone]   Fax:  [phone]  Physical Therapy Treatment  Patient Details  Name: Jason Moore MRN: [id number] Date of Birth: 07-16-1970 Referring Provider (PT): Dr. Elodia Florence Early   Encounter Date: 05/31/2020   PT End of Session - 05/31/20 1758    Visit Number 2    Date for PT Re-Evaluation 07/24/20    Authorization Type BCBS    PT Start Time 1230    PT Stop Time 1315    PT Time Calculation (min) 45 min    Activity Tolerance Patient tolerated treatment well           Past Medical History:  Diagnosis Date  . Anxiety   . Back pain   . Degeneration of lumbar intervertebral disc   . Hypertension   . Isthmic spondylolisthesis   . Joint pain   . Lumbar radiculopathy   . Numbness and tingling   . Sleep apnea    cpap @ [facility]    Past Surgical History:  Procedure Laterality Date  . ABDOMINAL EXPOSURE N/A 03/21/2020   Procedure: ABDOMINAL EXPOSURE;  Surgeon: Larina Earthly, MD;  Location: [facility] OR;  Service: Vascular;  Laterality: N/A;  . ANTERIOR LUMBAR FUSION N/A 03/21/2020   Procedure: ATTEMPTED ANTERIOR LUMBAR FUSION (ALIF) LUMBAR FIVE- SACRAL ONE, POSTERIOR SPINAL FUSION INTERBODY LUMBAR FIVE-SACRAL ONE;  Surgeon: Venita Lick, MD;  Location: [facility] OR;  Service: Orthopedics;  Laterality: N/A;  5 HRS Dr. Arbie Cookey to do approach Tap block with exparel  . SHOULDER ARTHROSCOPY  04/15/2007  . TRANSFORAMINAL LUMBAR INTERBODY FUSION (TLIF) WITH PEDICLE SCREW FIXATION 1 LEVEL N/A 05/02/2020   Procedure: TRANSFORAMINAL LUMBAR INTERBODY FUSION (TLIF) LUMBAR 5-SACRAL 1;  Surgeon: Venita Lick, MD;  Location: [facility] OR;  Service: Orthopedics;  Laterality: N/A;  4.5 hrs    There were no vitals filed for this visit.   Subjective Assessment - 05/31/20 1234    Subjective Tired from lack of sleep 3 hours.  I don't sleep well b/c of the pain.  Sleeping on sofa  reclincer. I've been doing the ex's and walking 3x/day.    Pertinent History RESTRICTIONS NO LIFT >5#, NO BEND,NO TWIST; NO LIFT OVERHEAD;  Ortho Dr. Venita Lick 06/12/20; Vascular end of Feb    Currently in Pain? Yes    Pain Score 3     Pain Location Back    Pain Orientation Left    Pain Type Surgical pain              [facility] PT Assessment - 05/31/20 0001      6 minute walk test results    Aerobic Endurance Distance Walked 548    Endurance additional comments with RW perceived exertion 9/20      Timed Up and Go Test   Normal TUG (seconds) 22.85   with RW   TUG Comments initially loses balance with sit to stand                         [facility] Adult PT Treatment/Exercise - 05/31/20 0001      Therapeutic Activites    Therapeutic Activities ADL's    ADL's review of hip hinge with sit to stand      Lumbar Exercises: Standing   Other Standing Lumbar Exercises counter push ups 10x      Knee/Hip Exercises: Standing   Heel Raises Both;10 reps  Heel Raises Limitations countertop    Hip ADduction AROM;Right;Left;1 set;10 reps    Hip ADduction Limitations holding to counter top    Functional Squat 10 reps    Functional Squat Limitations at the countertop    Other Standing Knee Exercises weight shifting 1 minute side to side    Other Standing Knee Exercises step taps to inside cabinet at the counter 10x      Knee/Hip Exercises: Seated   Sit to Sand 10 reps   mat table plus black foam no UEs     Ankle Exercises: Seated   Other Seated Ankle Exercises rocker board 30x bil                  PT Education - 05/31/20 1757    Education Details counter ex's: hip abduction, mini squats, push ups, step taps, sit to stand from higher chair    Person(s) Educated Patient    Methods Explanation;Demonstration;Handout    Comprehension Returned demonstration;Verbalized understanding            PT Short Term Goals - 05/29/20 1941      PT SHORT TERM GOAL #1    Title The patient will demonstrate knowledge of and compliance with initial HEP    Time 4    Period Weeks    Status New      PT SHORT TERM GOAL #2   Title The patient will be able to walk 8-10 min with RW needed for community mobility    Time 4    Period Weeks    Status New      PT SHORT TERM GOAL #3   Title The patient will be able to rise from a standard chair with minimal use of UE assist    Time 4    Period Weeks    Status New      PT SHORT TERM GOAL #4   Title Get baseline TUG and 6 min walk tests    Time 4    Period Weeks    Status New             PT Long Term Goals - 05/29/20 1945      PT LONG TERM GOAL #1   Title The patient will be independent with safe self progression of HEP    Time 8    Period Weeks    Status New      PT LONG TERM GOAL #2   Title The patient will report a 40% improvement in  left LE pain with home ADLs and return to work duties    Time 8    Period Weeks    Status New      PT LONG TERM GOAL #3   Title The patient will be able to ambulate household distances with single point cane    Time 8    Period Weeks    Status New      PT LONG TERM GOAL #4   Title Improved gait speed indicated by TUG time and 6 min walk time tests (to be set next visit)    Time 8    Period Weeks    Status New      PT LONG TERM GOAL #5   Title Bil LE strength improved to 4/5 to 4+/5 needed to ascend /descend steps at home, ease with sit to stand and to lift/carry light objects at home    Time 8    Period Weeks    Status New  Plan - 05/31/20 1306    Clinical Impression Statement The patient continues to use RW full time secondary to reports of  back pain and left LE pain.    He also reports the pain affects his ability to sleep.  He rates his pain low intensity  2 or 3 out of 10 today and is able to participate in low level standing ex's in addition to 6 min walk test and Timed up and Go tests.  Therapist monitoring pain and perceived  exertion level with pt rating exertion level at 9 out of 20 (heart rate 84) after walk test.  Standing exercises at the counter rated at 12 out of 20.   He requires less UE assist with sit to stand compared to initial assessment.  He is highly compliant with regular walking and ex setting a phone alarm as a reminder.    Comorbidities DDD, HTN, sleep apnea; recent left LE DVT (on blood thinner); 2 recent surgeries: 12/8 and posterior lumbar fusion 1/19 with no lift >5#, no bending, no twisting    Examination-Activity Limitations Locomotion Level;Transfers;Lift;Hygiene/Grooming;Dressing;Stairs;Stand;Squat;Carry    Examination-Participation Restrictions Meal Prep;Occupation;Community Activity;Driving;Other    Rehab Potential Good    PT Frequency 2x / week    PT Duration 8 weeks    PT Treatment/Interventions ADLs/Self Care Home Management;Aquatic Therapy;Cryotherapy;Electrical Stimulation;Moist Heat;Neuromuscular re-education;Therapeutic exercise;Therapeutic activities;Gait training;Stair training;Patient/family education;Manual techniques;Taping    PT Next Visit Plan low level standing ex's and seated ex's (referred to PT by hospitalist following left LE DVT);  will see Dr. Shon Baton (spinal fusion surgeon 3/1)    PT Home Exercise Plan [phone]           Patient will benefit from skilled therapeutic intervention in order to improve the following deficits and impairments:  Decreased range of motion,Difficulty walking,Decreased activity tolerance,Impaired perceived functional ability,Pain,Decreased strength,Decreased mobility,Impaired flexibility  Visit Diagnosis: Muscle weakness (generalized)  Difficulty in walking, not elsewhere classified  Pain in left leg     Problem List Patient Active Problem List   Diagnosis Date Noted  . Acute pulmonary embolism (HCC) 05/18/2020  . DVT (deep venous thrombosis) (HCC) 05/18/2020  . OSA (obstructive sleep apnea) 05/18/2020  . Fusion of lumbar spine  05/02/2020  . Degenerative disc disease at L5-S1 level 03/21/2020  . Hypertension 02/24/2020  . BPH with obstruction/lower urinary tract symptoms 05/12/2018  . Erectile dysfunction due to arterial insufficiency 05/12/2018  . Family history of prostate cancer in father 05/12/2018  . Constipation 12/14/2013  . Incomplete emptying of bladder 12/14/2013  . Organic impotence 12/14/2013  . Thrombosed external hemorrhoid 12/14/2013   Lavinia Sharps, PT 05/31/20 6:08 PM Phone: [phone] Fax: [phone] Vivien Presto 05/31/2020, 6:07 PM  [facility] [address] [place], [place], [address] Phone: [phone]   Fax:  [phone]  Name: Jason Moore MRN: [id number] Date of Birth: 1970-08-08

## 2020-05-31 NOTE — Patient Instructions (Signed)
Access Code: [email or web address] URL: [email or web address] Date: 05/31/2020 Prepared by: Lavinia Sharps  Exercises Supine Transversus Abdominis Bracing - Hands on Stomach - 1 x daily - 7 x weekly - 1 sets - 10 reps Supine Sciatic Nerve Glide - 1 x daily - 7 x weekly - 1 sets - 10 reps Standing Heel Raise with Support - 1 x daily - 7 x weekly - 1 sets - 10 reps Heel Toe Raises with Counter Support - 1 x daily - 7 x weekly - 1 sets - 10 reps Side to Side Weight Shift with Unilateral Counter Support - 1 x daily - 7 x weekly - 1 sets - 10 reps Standing Hip Abduction with Counter Support - 1 x daily - 7 x weekly - 1 sets - 10 reps Push-Up on Counter - 1 x daily - 7 x weekly - 1 sets - 10 reps Alternating Step Taps with Counter Support - 1 x daily - 7 x weekly - 1 sets - 10 reps Mini Squat with Counter Support - 1 x daily - 7 x weekly - 1 sets - 10 reps Sit to Stand - 1 x daily - 7 x weekly - 1 sets - 10 reps

## 2020-06-04 ENCOUNTER — Ambulatory Visit: Payer: BC Managed Care – PPO | Admitting: Physical Therapy

## 2020-06-04 ENCOUNTER — Encounter: Payer: Self-pay | Admitting: Physical Therapy

## 2020-06-04 ENCOUNTER — Other Ambulatory Visit: Payer: Self-pay

## 2020-06-04 DIAGNOSIS — R262 Difficulty in walking, not elsewhere classified: Secondary | ICD-10-CM | POA: Diagnosis not present

## 2020-06-04 DIAGNOSIS — M79605 Pain in left leg: Secondary | ICD-10-CM | POA: Diagnosis not present

## 2020-06-04 DIAGNOSIS — M6281 Muscle weakness (generalized): Secondary | ICD-10-CM

## 2020-06-04 NOTE — Therapy (Signed)
[facility] [address] [place], [place], [address] Phone: [phone]   Fax:  [phone]  Physical Therapy Treatment  Patient Details  Name: Jason Moore MRN: [id number] Date of Birth: 1971-03-15 Referring Provider (PT): Dr. Elodia Florence Early   Encounter Date: 06/04/2020   PT End of Session - 06/04/20 1355    Visit Number 3    Date for PT Re-Evaluation 07/24/20    Authorization Type BCBS    PT Start Time 1355    PT Stop Time 1436    PT Time Calculation (min) 41 min    Equipment Utilized During Treatment Back brace    Activity Tolerance Patient tolerated treatment well    Behavior During Therapy [name] for tasks assessed/performed           Past Medical History:  Diagnosis Date  . Anxiety   . Back pain   . Degeneration of lumbar intervertebral disc   . Hypertension   . Isthmic spondylolisthesis   . Joint pain   . Lumbar radiculopathy   . Numbness and tingling   . Sleep apnea    cpap @ [facility]    Past Surgical History:  Procedure Laterality Date  . ABDOMINAL EXPOSURE N/A 03/21/2020   Procedure: ABDOMINAL EXPOSURE;  Surgeon: Larina Earthly, MD;  Location: [facility] OR;  Service: Vascular;  Laterality: N/A;  . ANTERIOR LUMBAR FUSION N/A 03/21/2020   Procedure: ATTEMPTED ANTERIOR LUMBAR FUSION (ALIF) LUMBAR FIVE- SACRAL ONE, POSTERIOR SPINAL FUSION INTERBODY LUMBAR FIVE-SACRAL ONE;  Surgeon: Venita Lick, MD;  Location: [facility] OR;  Service: Orthopedics;  Laterality: N/A;  5 HRS Dr. Arbie Cookey to do approach Tap block with exparel  . SHOULDER ARTHROSCOPY  04/15/2007  . TRANSFORAMINAL LUMBAR INTERBODY FUSION (TLIF) WITH PEDICLE SCREW FIXATION 1 LEVEL N/A 05/02/2020   Procedure: TRANSFORAMINAL LUMBAR INTERBODY FUSION (TLIF) LUMBAR 5-SACRAL 1;  Surgeon: Venita Lick, MD;  Location: [facility] OR;  Service: Orthopedics;  Laterality: N/A;  4.5 hrs    There were no vitals filed for this visit.   Subjective Assessment -  06/04/20 1359    Subjective My back is hurting a little more than usual. He reports the counter top pushups aggrevate his back and so he stopped doin gthem .    Pertinent History RESTRICTIONS NO LIFT >5#, NO BEND,NO TWIST; NO LIFT OVERHEAD;  Ortho Dr. Venita Lick 06/12/20; Vascular end of Feb    Patient Stated Goals out of brace, off walker, walk for 5 minutes without pain    Currently in Pain? Yes    Pain Score 6     Pain Location Back    Pain Orientation Lower;Left   into the hip   Pain Descriptors / Indicators Radiating;Throbbing;Sore    Multiple Pain Sites No                             [facility] Adult PT Treatment/Exercise - 06/04/20 0001      Lumbar Exercises: Standing   Other Standing Lumbar Exercises declined      Knee/Hip Exercises: Aerobic   Nustep L1 x 5 min to finish session.      Knee/Hip Exercises: Standing   Heel Raises Both;2 sets;10 reps    Heel Raises Limitations countertop    Hip Abduction Both;10 reps;Knee straight;2 sets    Functional Squat 10 reps    Functional Squat Limitations at the countertop    Other Standing Knee Exercises weight shifting 1  minute 30 sec side to side, then added fwd/bkw 1 min    Other Standing Knee Exercises step taps to inside cabinet at the counter 10x2      Knee/Hip Exercises: Seated   Sit to Sand 10 reps;without UE support   chair with 1 black mat     Ankle Exercises: Seated   Other Seated Ankle Exercises rocker board x4 min for warm up while PTA did pain assessment.                    PT Short Term Goals - 05/29/20 1941      PT SHORT TERM GOAL #1   Title The patient will demonstrate knowledge of and compliance with initial HEP    Time 4    Period Weeks    Status New      PT SHORT TERM GOAL #2   Title The patient will be able to walk 8-10 min with RW needed for community mobility    Time 4    Period Weeks    Status New      PT SHORT TERM GOAL #3   Title The patient will be able to rise from a  standard chair with minimal use of UE assist    Time 4    Period Weeks    Status New      PT SHORT TERM GOAL #4   Title Get baseline TUG and 6 min walk tests    Time 4    Period Weeks    Status New             PT Long Term Goals - 05/29/20 1945      PT LONG TERM GOAL #1   Title The patient will be independent with safe self progression of HEP    Time 8    Period Weeks    Status New      PT LONG TERM GOAL #2   Title The patient will report a 40% improvement in  left LE pain with home ADLs and return to work duties    Time 8    Period Weeks    Status New      PT LONG TERM GOAL #3   Title The patient will be able to ambulate household distances with single point cane    Time 8    Period Weeks    Status New      PT LONG TERM GOAL #4   Title Improved gait speed indicated by TUG time and 6 min walk time tests (to be set next visit)    Time 8    Period Weeks    Status New      PT LONG TERM GOAL #5   Title Bil LE strength improved to 4/5 to 4+/5 needed to ascend /descend steps at home, ease with sit to stand and to lift/carry light objects at home    Time 8    Period Weeks    Status New                 Plan - 06/04/20 1355    Clinical Impression Statement Pt arrives today with reports of increased back pain, unsure why. He stopped doing counter top pushups at home because they aggrevate his back Pt was able to double his work load with seated and standing LE strengthening exercises. Back pain was stable throughout  the session. .    Personal Factors and Comorbidities Comorbidity 1;Comorbidity 2;Comorbidity 3+;Time since  onset of injury/illness/exacerbation    Comorbidities DDD, HTN, sleep apnea; recent left LE DVT (on blood thinner); 2 recent surgeries: 12/8 and posterior lumbar fusion 1/19 with no lift >5#, no bending, no twisting    Examination-Activity Limitations Locomotion Level;Transfers;Lift;Hygiene/Grooming;Dressing;Stairs;Stand;Squat;Carry     Examination-Participation Restrictions Meal Prep;Occupation;Community Activity;Driving;Other    Stability/Clinical Decision Making Evolving/Moderate complexity    Rehab Potential Good    PT Frequency 2x / week    PT Duration 8 weeks    PT Treatment/Interventions ADLs/Self Care Home Management;Aquatic Therapy;Cryotherapy;Electrical Stimulation;Moist Heat;Neuromuscular re-education;Therapeutic exercise;Therapeutic activities;Gait training;Stair training;Patient/family education;Manual techniques;Taping    PT Next Visit Plan low level standing ex's and seated ex's (referred to PT by hospitalist following left LE DVT);  will see Dr. Shon Baton (spinal fusion surgeon 3/1)    PT Home Exercise Plan [phone]    Consulted and Agree with Plan of Care Patient           Patient will benefit from skilled therapeutic intervention in order to improve the following deficits and impairments:  Decreased range of motion,Difficulty walking,Decreased activity tolerance,Impaired perceived functional ability,Pain,Decreased strength,Decreased mobility,Impaired flexibility  Visit Diagnosis: Muscle weakness (generalized)  Difficulty in walking, not elsewhere classified  Pain in left leg     Problem List Patient Active Problem List   Diagnosis Date Noted  . Acute pulmonary embolism (HCC) 05/18/2020  . DVT (deep venous thrombosis) (HCC) 05/18/2020  . OSA (obstructive sleep apnea) 05/18/2020  . Fusion of lumbar spine 05/02/2020  . Degenerative disc disease at L5-S1 level 03/21/2020  . Hypertension 02/24/2020  . BPH with obstruction/lower urinary tract symptoms 05/12/2018  . Erectile dysfunction due to arterial insufficiency 05/12/2018  . Family history of prostate cancer in father 05/12/2018  . Constipation 12/14/2013  . Incomplete emptying of bladder 12/14/2013  . Organic impotence 12/14/2013  . Thrombosed external hemorrhoid 12/14/2013    Daniyla Pfahler, PTA 06/04/2020, 2:38 PM  [facility] [address] [place], [place], [address] Phone: [phone]   Fax:  [phone]  Name: Jason Moore MRN: [id number] Date of Birth: [birth year]

## 2020-06-07 ENCOUNTER — Ambulatory Visit: Payer: BC Managed Care – PPO | Admitting: Physical Therapy

## 2020-06-07 ENCOUNTER — Other Ambulatory Visit: Payer: Self-pay

## 2020-06-07 DIAGNOSIS — M6281 Muscle weakness (generalized): Secondary | ICD-10-CM | POA: Diagnosis not present

## 2020-06-07 DIAGNOSIS — M79605 Pain in left leg: Secondary | ICD-10-CM | POA: Diagnosis not present

## 2020-06-07 DIAGNOSIS — R262 Difficulty in walking, not elsewhere classified: Secondary | ICD-10-CM

## 2020-06-07 NOTE — Therapy (Signed)
[facility] [address] [place], [place], [address] Phone: [phone]   Fax:  [phone]  Physical Therapy Treatment  Patient Details  Name: Jason Moore MRN: [id number] Date of Birth: 1971/03/31 Referring Provider (PT): Dr. Elodia Florence Early   Encounter Date: 06/07/2020   PT End of Session - 06/07/20 1745    Visit Number 4    Date for PT Re-Evaluation 07/24/20    Authorization Type BCBS    PT Start Time 1445    PT Stop Time 1529    PT Time Calculation (min) 44 min    Equipment Utilized During Treatment Back brace    Activity Tolerance Patient tolerated treatment well           Past Medical History:  Diagnosis Date  . Anxiety   . Back pain   . Degeneration of lumbar intervertebral disc   . Hypertension   . Isthmic spondylolisthesis   . Joint pain   . Lumbar radiculopathy   . Numbness and tingling   . Sleep apnea    cpap @ [facility]    Past Surgical History:  Procedure Laterality Date  . ABDOMINAL EXPOSURE N/A 03/21/2020   Procedure: ABDOMINAL EXPOSURE;  Surgeon: Larina Earthly, MD;  Location: [facility] OR;  Service: Vascular;  Laterality: N/A;  . ANTERIOR LUMBAR FUSION N/A 03/21/2020   Procedure: ATTEMPTED ANTERIOR LUMBAR FUSION (ALIF) LUMBAR FIVE- SACRAL ONE, POSTERIOR SPINAL FUSION INTERBODY LUMBAR FIVE-SACRAL ONE;  Surgeon: Venita Lick, MD;  Location: [facility] OR;  Service: Orthopedics;  Laterality: N/A;  5 HRS Dr. Arbie Cookey to do approach Tap block with exparel  . SHOULDER ARTHROSCOPY  04/15/2007  . TRANSFORAMINAL LUMBAR INTERBODY FUSION (TLIF) WITH PEDICLE SCREW FIXATION 1 LEVEL N/A 05/02/2020   Procedure: TRANSFORAMINAL LUMBAR INTERBODY FUSION (TLIF) LUMBAR 5-SACRAL 1;  Surgeon: Venita Lick, MD;  Location: [facility] OR;  Service: Orthopedics;  Laterality: N/A;  4.5 hrs    There were no vitals filed for this visit.   Subjective Assessment - 06/07/20 1449    Subjective Earlier in the mornings back and left  leg 7/10.  Now it's about a 3/10.  I can move my leg better now.  But this gets depressing.  I see the vascular doctor Monday and Dr. Shon Baton on Tuesday.    Pertinent History RESTRICTIONS NO LIFT >5#, NO BEND,NO TWIST; NO LIFT OVERHEAD;  Ortho Dr. Venita Lick 06/12/20; Vascular end of Feb    Patient Stated Goals out of brace, off walker, walk for 5 minutes without pain    Currently in Pain? Yes    Pain Score 3     Pain Location Back    Pain Orientation Lower;Left    Pain Type Surgical pain                             [facility] Adult PT Treatment/Exercise - 06/07/20 0001      Knee/Hip Exercises: Aerobic   Nustep L1 7 min LEs only seat 12      Knee/Hip Exercises: Standing   Heel Raises Both;2 sets;10 reps    Heel Raises Limitations countertop    Hip Abduction AROM;Right;Left;1 set;10 reps    Functional Squat 10 reps    Functional Squat Limitations at the countertop    Other Standing Knee Exercises side stepping 1 minute with counter support      Knee/Hip Exercises: Seated   Clamshell with TheraBand Blue   20x  Other Seated Knee/Hip Exercises blue band ankle DF 20x right/left    Sit to Sand 10 reps;without UE support   chair with 1 black mat                   PT Short Term Goals - 05/29/20 1941      PT SHORT TERM GOAL #1   Title The patient will demonstrate knowledge of and compliance with initial HEP    Time 4    Period Weeks    Status New      PT SHORT TERM GOAL #2   Title The patient will be able to walk 8-10 min with RW needed for community mobility    Time 4    Period Weeks    Status New      PT SHORT TERM GOAL #3   Title The patient will be able to rise from a standard chair with minimal use of UE assist    Time 4    Period Weeks    Status New      PT SHORT TERM GOAL #4   Title Get baseline TUG and 6 min walk tests    Time 4    Period Weeks    Status New             PT Long Term Goals - 05/29/20 1945      PT LONG TERM GOAL #1    Title The patient will be independent with safe self progression of HEP    Time 8    Period Weeks    Status New      PT LONG TERM GOAL #2   Title The patient will report a 40% improvement in  left LE pain with home ADLs and return to work duties    Time 8    Period Weeks    Status New      PT LONG TERM GOAL #3   Title The patient will be able to ambulate household distances with single point cane    Time 8    Period Weeks    Status New      PT LONG TERM GOAL #4   Title Improved gait speed indicated by TUG time and 6 min walk time tests (to be set next visit)    Time 8    Period Weeks    Status New      PT LONG TERM GOAL #5   Title Bil LE strength improved to 4/5 to 4+/5 needed to ascend /descend steps at home, ease with sit to stand and to lift/carry light objects at home    Time 8    Period Weeks    Status New                 Plan - 06/07/20 1745    Clinical Impression Statement The patient reports moderate to severe low back pain and left LE pain at random times although pain remains lower intensity today throughout treatment session.  He reports general fatigue with pain level unchanged following treatment.  He will follow up after he sees the surgeon on Tuesday.  Discussed/recommended  a PT referral from Dr. Shon Baton to specifically address back rehab.    Personal Factors and Comorbidities Comorbidity 1;Comorbidity 2;Comorbidity 3+;Time since onset of injury/illness/exacerbation    Comorbidities DDD, HTN, sleep apnea; recent left LE DVT (on blood thinner); 2 recent surgeries: 12/8 and posterior lumbar fusion 1/19 with no lift >5#, no bending, no twisting  Examination-Activity Limitations Locomotion Level;Transfers;Lift;Hygiene/Grooming;Dressing;Stairs;Stand;Squat;Carry    Examination-Participation Restrictions Meal Prep;Occupation;Community Activity;Driving;Other    Stability/Clinical Decision Making Evolving/Moderate complexity    Rehab Potential Good    PT  Frequency 2x / week    PT Duration 8 weeks    PT Treatment/Interventions ADLs/Self Care Home Management;Aquatic Therapy;Cryotherapy;Electrical Stimulation;Moist Heat;Neuromuscular re-education;Therapeutic exercise;Therapeutic activities;Gait training;Stair training;Patient/family education;Manual techniques;Taping    PT Next Visit Plan low level standing ex's and seated ex's (referred to PT by hospitalist following left LE DVT);  will see Dr. Shon Baton (spinal fusion surgeon 3/1)    PT Home Exercise Plan [phone]           Patient will benefit from skilled therapeutic intervention in order to improve the following deficits and impairments:  Decreased range of motion,Difficulty walking,Decreased activity tolerance,Impaired perceived functional ability,Pain,Decreased strength,Decreased mobility,Impaired flexibility  Visit Diagnosis: Muscle weakness (generalized)  Difficulty in walking, not elsewhere classified  Pain in left leg     Problem List Patient Active Problem List   Diagnosis Date Noted  . Acute pulmonary embolism (HCC) 05/18/2020  . DVT (deep venous thrombosis) (HCC) 05/18/2020  . OSA (obstructive sleep apnea) 05/18/2020  . Fusion of lumbar spine 05/02/2020  . Degenerative disc disease at L5-S1 level 03/21/2020  . Hypertension 02/24/2020  . BPH with obstruction/lower urinary tract symptoms 05/12/2018  . Erectile dysfunction due to arterial insufficiency 05/12/2018  . Family history of prostate cancer in father 05/12/2018  . Constipation 12/14/2013  . Incomplete emptying of bladder 12/14/2013  . Organic impotence 12/14/2013  . Thrombosed external hemorrhoid 12/14/2013   Lavinia Sharps, PT 06/07/20 5:51 PM Phone: [phone] Fax: [phone]  Vivien Presto 06/07/2020, 5:50 PM  [facility] [address], [address] [place], [place], [address] Phone: [phone]   Fax:  [phone]  Name: ISAAK DELMUNDO MRN:  [id number] Date of Birth: 10-05-1970

## 2020-06-11 ENCOUNTER — Encounter: Payer: Self-pay | Admitting: Vascular Surgery

## 2020-06-11 ENCOUNTER — Ambulatory Visit (INDEPENDENT_AMBULATORY_CARE_PROVIDER_SITE_OTHER): Payer: BC Managed Care – PPO | Admitting: Vascular Surgery

## 2020-06-11 ENCOUNTER — Other Ambulatory Visit: Payer: Self-pay

## 2020-06-11 VITALS — BP 142/84 | HR 68 | Temp 97.8°F | Resp 16 | Ht 71.5 in | Wt 255.0 lb

## 2020-06-11 DIAGNOSIS — I82412 Acute embolism and thrombosis of left femoral vein: Secondary | ICD-10-CM

## 2020-06-11 NOTE — Progress Notes (Signed)
[facility] [place]  Patient name: Jason Moore MRN: [id number] DOB: June 02, 1970 Sex: male  REASON FOR VISIT: Follow-up left leg DVT and pulmonary embolus  HPI: Jason Moore is a 50 y.o. male here today for follow-up.  He has a complex history.  He was taken to the operating room on 03/21/2021 for planned for anterior lumbar interbody fusion L5-S1.  He had intraoperative complication of severe venous injury.  This was repaired but it was felt to be impossible to proceed with anterior fusion due to the continued tearing of his iliac vein at the bifurcation of his cava.  His procedure was aborted.  He then was returned to the operating room on 05/02/2020 by Dr. Shon Baton for fusion from a separate approach.  He did well with this initially.  He then presented on 05/18/2020 with severe left leg swelling.  Work-up revealed extensive DVT from his left iliac vein throughout his leg into his tibial veins.  CT angiogram of his chest also revealed a small right pulmonary embolus.  He was begun on Eliquis.  His swelling in his left leg markedly resolved over several days of hospitalization he was discharged home.  He is here today for follow-up with his wife.  He had intended improvement in his swelling.  He has been extremely compliant with thigh-high compression and elevation.  He has had no pulmonary issues.  He does report over the last 2 nights that he had aching discomfort in his left whole leg which was relatively new.  This was controlled with a small dose of narcotic pain medication.  Current Outpatient Medications  Medication Sig Dispense Refill  . apixaban (ELIQUIS) 5 MG TABS tablet Take 1 tablet (5 mg total) by mouth 2 (two) times daily. 60 tablet 0  . escitalopram (LEXAPRO) 5 MG tablet Take 5 mg by mouth daily.    Marland Kitchen gabapentin (NEURONTIN) 300 MG capsule Take 300 mg by mouth 3 (three) times daily.    . methocarbamol (ROBAXIN) 500 MG tablet  Take 500 mg by mouth 3 (three) times daily.    . nebivolol (BYSTOLIC) 5 MG tablet Take 5 mg by mouth daily.    Marland Kitchen oxyCODONE-acetaminophen (PERCOCET) 10-325 MG tablet Take 1 tablet by mouth 3 (three) times daily.     No current facility-administered medications for this visit.     PHYSICAL EXAM: Vitals:   06/11/20 1402  BP: (!) 142/84  Pulse: 68  Resp: 16  Temp: 97.8 F (36.6 C)  TempSrc: Other (Comment)  SpO2: 95%  Weight: 255 lb (115.7 kg)  Height: 5' 11.5" (1.816 m)    GENERAL: The patient is a well-nourished male, in no acute distress. The vital signs are documented above. He has a marked improvement in the swelling in his left leg compared to hospitalization.  Still slightly swollen in relation to the right.  Easily palpable dorsalis pedis pulse  MEDICAL ISSUES: I had a very long discussion with the patient and his wife regarding all of these issues.  Explained recommendation of a total of 6 months of anticoagulation therapy.  I explained that he can liberalize his walking as long as he is having no marked increase in swelling.  I discussed that his risk for pulmonary embolus should continue to diminish the further out he is from the acute DVT and that his swelling should also diminished.  I did discuss the potential for postphlebitic syndrome and years to come in that this would be controlled with ongoing  use of compression if needed.  I will see him again in 3 months with repeat venous duplex at that time   Larina Earthly, MD [facility] [phone]  Note: Portions of this report may have been transcribed using voice recognition software.  Every effort has been made to ensure accuracy; however, inadvertent computerized transcription errors may still be present.

## 2020-06-12 DIAGNOSIS — Z4889 Encounter for other specified surgical aftercare: Secondary | ICD-10-CM | POA: Diagnosis not present

## 2020-06-15 ENCOUNTER — Other Ambulatory Visit: Payer: Self-pay

## 2020-06-15 ENCOUNTER — Ambulatory Visit: Payer: BC Managed Care – PPO | Attending: Internal Medicine | Admitting: Physical Therapy

## 2020-06-15 DIAGNOSIS — R262 Difficulty in walking, not elsewhere classified: Secondary | ICD-10-CM | POA: Diagnosis not present

## 2020-06-15 DIAGNOSIS — M6281 Muscle weakness (generalized): Secondary | ICD-10-CM | POA: Diagnosis not present

## 2020-06-15 DIAGNOSIS — M545 Low back pain, unspecified: Secondary | ICD-10-CM | POA: Diagnosis not present

## 2020-06-15 DIAGNOSIS — M79605 Pain in left leg: Secondary | ICD-10-CM | POA: Diagnosis not present

## 2020-06-15 NOTE — Therapy (Signed)
[facility] [address], [address] [place], [place], [address] Phone: [phone]   Fax:  [phone]  Physical Therapy Treatment/Recertification   Patient Details  Name: Jason Moore MRN: [id number] Date of Birth: January 02, 1971 Referring Provider (PT): Dr. Venita Lick   Encounter Date: 06/15/2020   PT End of Session - 06/15/20 1248    Visit Number 5    Date for PT Re-Evaluation 09/07/20    Authorization Type BCBS    PT Start Time 1100    PT Stop Time 1146    PT Time Calculation (min) 46 min    Activity Tolerance Patient tolerated treatment well           Past Medical History:  Diagnosis Date  . Anxiety   . Back pain   . Degeneration of lumbar intervertebral disc   . Hypertension   . Isthmic spondylolisthesis   . Joint pain   . Lumbar radiculopathy   . Numbness and tingling   . Sleep apnea    cpap @ [facility]    Past Surgical History:  Procedure Laterality Date  . ABDOMINAL EXPOSURE N/A 03/21/2020   Procedure: ABDOMINAL EXPOSURE;  Surgeon: Larina Earthly, MD;  Location: [facility] OR;  Service: Vascular;  Laterality: N/A;  . ANTERIOR LUMBAR FUSION N/A 03/21/2020   Procedure: ATTEMPTED ANTERIOR LUMBAR FUSION (ALIF) LUMBAR FIVE- SACRAL ONE, POSTERIOR SPINAL FUSION INTERBODY LUMBAR FIVE-SACRAL ONE;  Surgeon: Venita Lick, MD;  Location: [facility] OR;  Service: Orthopedics;  Laterality: N/A;  5 HRS Dr. Arbie Cookey to do approach Tap block with exparel  . SHOULDER ARTHROSCOPY  04/15/2007  . TRANSFORAMINAL LUMBAR INTERBODY FUSION (TLIF) WITH PEDICLE SCREW FIXATION 1 LEVEL N/A 05/02/2020   Procedure: TRANSFORAMINAL LUMBAR INTERBODY FUSION (TLIF) LUMBAR 5-SACRAL 1;  Surgeon: Venita Lick, MD;  Location: [facility] OR;  Service: Orthopedics;  Laterality: N/A;  4.5 hrs    There were no vitals filed for this visit.   Subjective Assessment - 06/15/20 1107    Subjective Saw Dr. Shon Baton and he did x-rays and said all the screws were in place.  Pt has new  order for lumbar surgical aftercare LBP program including aquatic program.  Dr. Arbie Cookey is going to another U/S later and recommend taking off compression garments at night.  Patient reports 7/10 2x/week for a few hours at a time.    Pertinent History RESTRICTIONS NO LIFT >5#, NO BEND,NO TWIST; NO LIFT OVERHEAD;  Ortho Dr. Venita Lick 06/12/20; Vascular end of Feb    Patient Stated Goals out of brace, off walker, walk for 5 minutes without pain    Currently in Pain? Yes    Pain Score 3     Pain Location Back    Pain Orientation Left    Pain Radiating Towards left hip and left thigh    Pain Frequency Constant              [facility] PT Assessment - 06/15/20 0001      Assessment   Medical Diagnosis lumbar postop program    Referring Provider (PT) Dr. Venita Lick    Next MD Visit Mid April    Prior Therapy for DVT      Precautions   Required Braces or Orthoses --   brace when in public but not at home     AROM   Overall AROM Comments LEs WFLs; spinal ROM not tested secondary to fusion precautions      Strength   Overall Strength Comments Spinal strength not  formally tested secondary to surgical precautions; needs UE assist with sit to stand    Right Hip Flexion 4/5    Right Hip ABduction 4-/5    Left Hip Flexion 4/5    Left Hip ABduction 4-/5    Right Knee Flexion 4/5    Right Knee Extension 4-/5    Left Knee Flexion 4/5    Left Knee Extension 4-/5    Right Ankle Dorsiflexion 4/5    Right Ankle Plantar Flexion 4/5    Right Ankle Inversion 4/5    Right Ankle Eversion 4/5    Left Ankle Dorsiflexion 4/5    Left Ankle Plantar Flexion 4/5    Left Ankle Inversion 4/5    Left Ankle Eversion 4/5      Ambulation/Gait   Assistive device Rolling walker    Gait Comments dec gait speed; very short community distances; uses RW for household distances too      6 minute walk test results    Aerobic Endurance Distance Walked 548    Endurance additional comments with RW perceived exertion  9/20      Timed Up and Go Test   Normal TUG (seconds) 22.85   with RW                        [facility] Adult PT Treatment/Exercise - 06/15/20 0001      Lumbar Exercises: Aerobic   Nustep 14 min L1 UE/LE while discussing status, MD visits      Lumbar Exercises: Seated   Other Seated Lumbar Exercises arm beats 30 sec      Lumbar Exercises: Supine   Ab Set 10 reps    Clam 20 reps    Clam Limitations red band    Heel Slides 10 reps    Heel Slides Limitations core set    Other Supine Lumbar Exercises beats 2x 30 sec    Other Supine Lumbar Exercises ball squeeze between knees with bil UE elevation and lat pull down 10x                  PT Education - 06/15/20 1247    Education Details supine core set with heel slides; red band supine clams; supine lat pull downs; arm beats seated    Person(s) Educated Patient    Methods Explanation;Demonstration;Handout    Comprehension Returned demonstration;Verbalized understanding            PT Short Term Goals - 06/15/20 1258      PT SHORT TERM GOAL #1   Status Achieved      PT SHORT TERM GOAL #2   Title The patient will be able to walk 8-10 min with RW needed for community mobility    Time 4    Period Weeks    Status On-going      PT SHORT TERM GOAL #3   Title The patient will be able to rise from a standard chair with minimal use of UE assist    Status Achieved      PT SHORT TERM GOAL #4   Title Get baseline TUG and 6 min walk tests    Time 4    Period Weeks    Status Achieved             PT Long Term Goals - 06/15/20 1259      PT LONG TERM GOAL #1   Title The patient will be independent with safe self progression of HEP  Time 8    Period Weeks    Status On-going    Target Date 09/07/20      PT LONG TERM GOAL #2   Title The patient will report a 40% improvement in  left LE pain with home ADLs and return to work duties    Time 12    Period Weeks    Status On-going      PT LONG TERM GOAL  #3   Title The patient will be able to ambulate household distances with single point cane    Time 12    Period Weeks    Status On-going      PT LONG TERM GOAL #4   Title Improved gait speed indicated by TUG time to 18 sec or less and 6 min walk test to 800 feet    Time 12    Period Weeks    Status On-going      PT LONG TERM GOAL #5   Title Bil LE strength improved to 4/5 to 4+/5 needed to ascend /descend steps at home, ease with sit to stand and to lift/carry light objects at home    Time 12    Period Weeks    Status On-going      Additional Long Term Goals   Additional Long Term Goals Yes      PT LONG TERM GOAL #6   Title Modified Oswestry Score improved by at least 8 points    Time 12    Period Weeks    Status New                 Plan - 06/15/20 1146    Clinical Impression Statement The patient presents with a PT referral for surgical back rehab.  The patient is already on caseload for treatment of weakness related to DVT referred by hospitalist.  Will send new certification today to Dr. Shon Baton and all future communication to him.  Initiated low-level supported core strengthening.  Minimal cues needed to activate transverse abdominus initially  without pelvic tilt but then he is able to perform well.  He denies and increase in back or LE pain or excessive muscular fatigue.  He continues to be dependent on the RW secondary to his lack of trust in the stability of his left LE and sudden, sharp pains that occur at times.  He reports overall pain is less constant and intense.    Comorbidities DDD, HTN, sleep apnea; recent left LE DVT (on blood thinner); 2 recent surgeries: 12/8 and posterior lumbar fusion 1/19 with no lift >5#, no bending, no twisting    Examination-Participation Restrictions Meal Prep;Occupation;Community Activity;Driving;Other    Rehab Potential Good    PT Frequency 2x / week    PT Duration 8 weeks    PT Treatment/Interventions ADLs/Self Care Home  Management;Aquatic Therapy;Cryotherapy;Electrical Stimulation;Moist Heat;Neuromuscular re-education;Therapeutic exercise;Therapeutic activities;Gait training;Stair training;Patient/family education;Manual techniques;Taping    PT Next Visit Plan Do Modified Oswestry to set LTG;  spinal fusion surgical rehab program;  low level LE strengthening; Nu-Step;  initiate aquatic PT    PT Home Exercise Plan [phone]           Patient will benefit from skilled therapeutic intervention in order to improve the following deficits and impairments:  Decreased range of motion,Difficulty walking,Decreased activity tolerance,Impaired perceived functional ability,Pain,Decreased strength,Decreased mobility,Impaired flexibility  Visit Diagnosis: Acute bilateral low back pain, unspecified whether sciatica present - Plan: PT plan of care cert/re-cert  Muscle weakness (generalized) - Plan: PT plan of  care cert/re-cert  Difficulty in walking, not elsewhere classified - Plan: PT plan of care cert/re-cert  Pain in left leg - Plan: PT plan of care cert/re-cert     Problem List Patient Active Problem List   Diagnosis Date Noted  . Acute pulmonary embolism (HCC) 05/18/2020  . DVT (deep venous thrombosis) (HCC) 05/18/2020  . OSA (obstructive sleep apnea) 05/18/2020  . Fusion of lumbar spine 05/02/2020  . Degenerative disc disease at L5-S1 level 03/21/2020  . Hypertension 02/24/2020  . BPH with obstruction/lower urinary tract symptoms 05/12/2018  . Erectile dysfunction due to arterial insufficiency 05/12/2018  . Family history of prostate cancer in father 05/12/2018  . Constipation 12/14/2013  . Incomplete emptying of bladder 12/14/2013  . Organic impotence 12/14/2013  . Thrombosed external hemorrhoid 12/14/2013   Lavinia Sharps, PT 06/15/20 1:05 PM Phone: [phone] Fax: [phone] Vivien Presto 06/15/2020, 1:04 PM  [facility] [address] [place], [place], [address] Phone: [phone]   Fax:  [phone]  Name: TRYSTIAN CRISANTO MRN: [id number] Date of Birth: November 04, 1970

## 2020-06-15 NOTE — Patient Instructions (Signed)
Access Code: [email or web address] URL: [email or web address] Date: 06/15/2020 Prepared by: Lavinia Sharps  Exercises Supine Sciatic Nerve Glide - 1 x daily - 7 x weekly - 1 sets - 10 reps Standing Heel Raise with Support - 1 x daily - 7 x weekly - 1 sets - 10 reps Heel Toe Raises with Counter Support - 1 x daily - 7 x weekly - 1 sets - 10 reps Side to Side Weight Shift with Unilateral Counter Support - 1 x daily - 7 x weekly - 1 sets - 10 reps Standing Hip Abduction with Counter Support - 1 x daily - 7 x weekly - 1 sets - 10 reps Push-Up on Counter - 1 x daily - 7 x weekly - 1 sets - 10 reps Alternating Step Taps with Counter Support - 1 x daily - 7 x weekly - 1 sets - 10 reps Mini Squat with Counter Support - 1 x daily - 7 x weekly - 1 sets - 10 reps Sit to Stand - 1 x daily - 7 x weekly - 1 sets - 10 reps Supine Transversus Abdominis Bracing - Hands on Stomach - 1 x daily - 7 x weekly - 1 sets - 10 reps Supine Transversus Abdominis Bracing with Heel Slide - 1 x daily - 7 x weekly - 1 sets - 10 reps Pilates upper extremity elevation with lat pull downs - 1 x daily - 7 x weekly - 1 sets - 10 reps SEATED ARM PULSES - 1 x daily - 7 x weekly - 1 sets - 10 reps Hooklying Clamshell with Resistance - 1 x daily - 7 x weekly - 3 sets - 10 reps

## 2020-06-18 ENCOUNTER — Ambulatory Visit: Payer: BC Managed Care – PPO | Admitting: Physical Therapy

## 2020-06-18 ENCOUNTER — Other Ambulatory Visit: Payer: Self-pay

## 2020-06-18 ENCOUNTER — Encounter: Payer: Self-pay | Admitting: Physical Therapy

## 2020-06-18 ENCOUNTER — Encounter: Payer: BC Managed Care – PPO | Admitting: Physical Therapy

## 2020-06-18 DIAGNOSIS — R262 Difficulty in walking, not elsewhere classified: Secondary | ICD-10-CM

## 2020-06-18 DIAGNOSIS — M6281 Muscle weakness (generalized): Secondary | ICD-10-CM | POA: Diagnosis not present

## 2020-06-18 DIAGNOSIS — M79605 Pain in left leg: Secondary | ICD-10-CM | POA: Diagnosis not present

## 2020-06-18 DIAGNOSIS — M545 Low back pain, unspecified: Secondary | ICD-10-CM

## 2020-06-18 NOTE — Therapy (Signed)
[facility] [address], [address] [place], [place], [address] Phone: [phone]   Fax:  [phone]  Physical Therapy Treatment  Patient Details  Name: Jason Moore MRN: [id number] Date of Birth: 22-Apr-1970 Referring Provider (PT): Dr. Venita Lick   Encounter Date: 06/18/2020   PT End of Session - 06/18/20 1711    Visit Number 6    Date for PT Re-Evaluation 09/07/20    Authorization Type BCBS    PT Start Time 1621    PT Stop Time 1700    PT Time Calculation (min) 39 min    Activity Tolerance Patient tolerated treatment well    Behavior During Therapy [facility] for tasks assessed/performed           Past Medical History:  Diagnosis Date  . Anxiety   . Back pain   . Degeneration of lumbar intervertebral disc   . Hypertension   . Isthmic spondylolisthesis   . Joint pain   . Lumbar radiculopathy   . Numbness and tingling   . Sleep apnea    cpap @ [facility]    Past Surgical History:  Procedure Laterality Date  . ABDOMINAL EXPOSURE N/A 03/21/2020   Procedure: ABDOMINAL EXPOSURE;  Surgeon: Larina Earthly, MD;  Location: [facility] OR;  Service: Vascular;  Laterality: N/A;  . ANTERIOR LUMBAR FUSION N/A 03/21/2020   Procedure: ATTEMPTED ANTERIOR LUMBAR FUSION (ALIF) LUMBAR FIVE- SACRAL ONE, POSTERIOR SPINAL FUSION INTERBODY LUMBAR FIVE-SACRAL ONE;  Surgeon: Venita Lick, MD;  Location: [facility] OR;  Service: Orthopedics;  Laterality: N/A;  5 HRS Dr. Arbie Cookey to do approach Tap block with exparel  . SHOULDER ARTHROSCOPY  04/15/2007  . TRANSFORAMINAL LUMBAR INTERBODY FUSION (TLIF) WITH PEDICLE SCREW FIXATION 1 LEVEL N/A 05/02/2020   Procedure: TRANSFORAMINAL LUMBAR INTERBODY FUSION (TLIF) LUMBAR 5-SACRAL 1;  Surgeon: Venita Lick, MD;  Location: [facility] OR;  Service: Orthopedics;  Laterality: N/A;  4.5 hrs    There were no vitals filed for this visit.   Subjective Assessment - 06/18/20 1624    Subjective I am doing okay today. Pain is only 3/10  and I can tell I have more mobility    Pertinent History RESTRICTIONS NO LIFT >5#, NO BEND,NO TWIST; NO LIFT OVERHEAD;  Ortho Dr. Venita Lick 06/12/20; Vascular end of Feb    Patient Stated Goals out of brace, off walker, walk for 5 minutes without pain    Currently in Pain? Yes    Pain Score 3     Pain Location Back    Pain Orientation Left;Mid    Pain Descriptors / Indicators Aching;Sharp    Pain Type Surgical pain    Pain Radiating Towards sharp down the leg    Pain Onset More than a month ago    Pain Frequency Constant    Multiple Pain Sites No                             [facility] Adult PT Treatment/Exercise - 06/18/20 0001      Lumbar Exercises: Aerobic   Nustep 10 min L3 UE/LE while discussing status, MD visits      Lumbar Exercises: Supine   Clam 20 reps    Clam Limitations blue loop    Heel Slides 10 reps    Heel Slides Limitations core set    Other Supine Lumbar Exercises UE pulls red band - extension with PT holding band - 20x  Knee/Hip Exercises: Standing   Heel Raises Both;2 sets;10 reps    Heel Raises Limitations at stairs for support    Hip Flexion Stengthening;Right;Left;10 reps;Knee bent   holding railings for support   Functional Squat 10 reps    Functional Squat Limitations to chair with mat    SLS one UE support 5 sec hold - 10x each side      Knee/Hip Exercises: Seated   Clamshell with TheraBand Blue   20x   Hamstring Curl Strengthening;Both;2 sets;10 reps   green band                   PT Short Term Goals - 06/15/20 1258      PT SHORT TERM GOAL #1   Status Achieved      PT SHORT TERM GOAL #2   Title The patient will be able to walk 8-10 min with RW needed for community mobility    Time 4    Period Weeks    Status On-going      PT SHORT TERM GOAL #3   Title The patient will be able to rise from a standard chair with minimal use of UE assist    Status Achieved      PT SHORT TERM GOAL #4   Title Get baseline TUG  and 6 min walk tests    Time 4    Period Weeks    Status Achieved             PT Long Term Goals - 06/15/20 1259      PT LONG TERM GOAL #1   Title The patient will be independent with safe self progression of HEP    Time 8    Period Weeks    Status On-going    Target Date 09/07/20      PT LONG TERM GOAL #2   Title The patient will report a 40% improvement in  left LE pain with home ADLs and return to work duties    Time 12    Period Weeks    Status On-going      PT LONG TERM GOAL #3   Title The patient will be able to ambulate household distances with single point cane    Time 12    Period Weeks    Status On-going      PT LONG TERM GOAL #4   Title Improved gait speed indicated by TUG time to 18 sec or less and 6 min walk test to 800 feet    Time 12    Period Weeks    Status On-going      PT LONG TERM GOAL #5   Title Bil LE strength improved to 4/5 to 4+/5 needed to ascend /descend steps at home, ease with sit to stand and to lift/carry light objects at home    Time 12    Period Weeks    Status On-going      Additional Long Term Goals   Additional Long Term Goals Yes      PT LONG TERM GOAL #6   Title Modified Oswestry Score improved by at least 8 points    Time 12    Period Weeks    Status New                 Plan - 06/18/20 1706    Clinical Impression Statement Pt did well with progressing exercises today and reports pain has been constant but less intense.  He is  still working on LE and adding core strength.  Pt was able to add resistance to nustep today.  He did well activating his trunk stabilizers in standing, sitting, and supine.  Pt had no increased pain, just reports fatigue at the end of the session. He will benefit from skilled PT to continue to progress trunk stability and reduce pain.    PT Treatment/Interventions ADLs/Self Care Home Management;Aquatic Therapy;Cryotherapy;Electrical Stimulation;Moist Heat;Neuromuscular re-education;Therapeutic  exercise;Therapeutic activities;Gait training;Stair training;Patient/family education;Manual techniques;Taping    PT Next Visit Plan Do Modified Oswestry to set LTG;  continue spinal fusion surgical rehab program;  low level LE strengthening; Nu-Step;  initiate aquatic PT    PT Home Exercise Plan [id number]    Consulted and Agree with Plan of Care Patient           Patient will benefit from skilled therapeutic intervention in order to improve the following deficits and impairments:  Decreased range of motion,Difficulty walking,Decreased activity tolerance,Impaired perceived functional ability,Pain,Decreased strength,Decreased mobility,Impaired flexibility  Visit Diagnosis: Acute bilateral low back pain, unspecified whether sciatica present  Muscle weakness (generalized)  Difficulty in walking, not elsewhere classified  Pain in left leg     Problem List Patient Active Problem List   Diagnosis Date Noted  . Acute pulmonary embolism (HCC) 05/18/2020  . DVT (deep venous thrombosis) (HCC) 05/18/2020  . OSA (obstructive sleep apnea) 05/18/2020  . Fusion of lumbar spine 05/02/2020  . Degenerative disc disease at L5-S1 level 03/21/2020  . Hypertension 02/24/2020  . BPH with obstruction/lower urinary tract symptoms 05/12/2018  . Erectile dysfunction due to arterial insufficiency 05/12/2018  . Family history of prostate cancer in father 05/12/2018  . Constipation 12/14/2013  . Incomplete emptying of bladder 12/14/2013  . Organic impotence 12/14/2013  . Thrombosed external hemorrhoid 12/14/2013    Junious Silk, PT 06/18/2020, 5:12 PM  [facility] [address], [address] [place], [place], [address] Phone: [phone]   Fax:  [phone]  Name: Jason Moore MRN: [id number] Date of Birth: April 01, 1971

## 2020-06-20 ENCOUNTER — Other Ambulatory Visit: Payer: Self-pay

## 2020-06-20 DIAGNOSIS — I82412 Acute embolism and thrombosis of left femoral vein: Secondary | ICD-10-CM

## 2020-06-21 ENCOUNTER — Ambulatory Visit: Payer: BC Managed Care – PPO | Admitting: Physical Therapy

## 2020-06-21 ENCOUNTER — Encounter: Payer: Self-pay | Admitting: Physical Therapy

## 2020-06-21 ENCOUNTER — Other Ambulatory Visit: Payer: Self-pay

## 2020-06-21 DIAGNOSIS — M545 Low back pain, unspecified: Secondary | ICD-10-CM | POA: Diagnosis not present

## 2020-06-21 DIAGNOSIS — M6281 Muscle weakness (generalized): Secondary | ICD-10-CM | POA: Diagnosis not present

## 2020-06-21 DIAGNOSIS — R262 Difficulty in walking, not elsewhere classified: Secondary | ICD-10-CM

## 2020-06-21 DIAGNOSIS — M79605 Pain in left leg: Secondary | ICD-10-CM | POA: Diagnosis not present

## 2020-06-21 NOTE — Therapy (Signed)
[facility] [address] [place], [place], [address] Phone: [phone]   Fax:  [phone]  Physical Therapy Treatment  Patient Details  Name: Jason Moore MRN: [id number] Date of Birth: 08-25-1970 Referring Provider (PT): Dr. Venita Lick   Encounter Date: 06/21/2020   PT End of Session - 06/21/20 1621    Visit Number 7    Date for PT Re-Evaluation 09/07/20    Authorization Type BCBS    PT Start Time 1618    PT Stop Time 1659    PT Time Calculation (min) 41 min    Equipment Utilized During Treatment Gait belt    Activity Tolerance Patient tolerated treatment well    Behavior During Therapy [facility] for tasks assessed/performed           Past Medical History:  Diagnosis Date  . Anxiety   . Back pain   . Degeneration of lumbar intervertebral disc   . Hypertension   . Isthmic spondylolisthesis   . Joint pain   . Lumbar radiculopathy   . Numbness and tingling   . Sleep apnea    cpap @ [facility]    Past Surgical History:  Procedure Laterality Date  . ABDOMINAL EXPOSURE N/A 03/21/2020   Procedure: ABDOMINAL EXPOSURE;  Surgeon: Larina Earthly, MD;  Location: [facility] OR;  Service: Vascular;  Laterality: N/A;  . ANTERIOR LUMBAR FUSION N/A 03/21/2020   Procedure: ATTEMPTED ANTERIOR LUMBAR FUSION (ALIF) LUMBAR FIVE- SACRAL ONE, POSTERIOR SPINAL FUSION INTERBODY LUMBAR FIVE-SACRAL ONE;  Surgeon: Venita Lick, MD;  Location: [facility] OR;  Service: Orthopedics;  Laterality: N/A;  5 HRS Dr. Arbie Cookey to do approach Tap block with exparel  . SHOULDER ARTHROSCOPY  04/15/2007  . TRANSFORAMINAL LUMBAR INTERBODY FUSION (TLIF) WITH PEDICLE SCREW FIXATION 1 LEVEL N/A 05/02/2020   Procedure: TRANSFORAMINAL LUMBAR INTERBODY FUSION (TLIF) LUMBAR 5-SACRAL 1;  Surgeon: Venita Lick, MD;  Location: [facility] OR;  Service: Orthopedics;  Laterality: N/A;  4.5 hrs    There were no vitals filed for this visit.   Subjective Assessment - 06/21/20 1620     Subjective Patient reports 3/10 pain mostly in low back and Lt foot. Was fatigued after last session.    Pertinent History RESTRICTIONS NO LIFT >5#, NO BEND,NO TWIST; NO LIFT OVERHEAD;  Ortho Dr. Venita Lick 06/12/20; Vascular end of Feb    Limitations Walking;Standing;House hold activities    Patient Stated Goals out of brace, off walker, walk for 5 minutes without pain    Currently in Pain? Yes    Pain Score 3     Pain Location Foot    Pain Orientation Mid;Left    Pain Onset More than a month ago    Pain Frequency Constant    Multiple Pain Sites No              [facility] PT Assessment - 06/21/20 0001      Observation/Other Assessments   Modified Oswestry 68%                         [facility] Adult PT Treatment/Exercise - 06/21/20 0001      Lumbar Exercises: Aerobic   Nustep 10 min L3 UE/LE while discussing status, MD visits      Lumbar Exercises: Supine   Clam 20 reps    Clam Limitations blue loop    Heel Slides 10 reps   x10 bil LE   Heel Slides Limitations core set  Other Supine Lumbar Exercises UE pulls red band - extension with PT holding band - 15x      Knee/Hip Exercises: Standing   Heel Raises Both;2 sets;10 reps    Heel Raises Limitations at treadmill for UE support    Hip Flexion Stengthening;Right;Left;10 reps;Knee bent    Functional Squat 10 reps    Functional Squat Limitations to chair with mat    SLS one UE support 5x10s hold bil LE    Gait Training gait with SPC x4 trips between carpet edges    Other Standing Knee Exercises hamstring curl with RW support 2# x15 bil LE                    PT Short Term Goals - 06/15/20 1258      PT SHORT TERM GOAL #1   Status Achieved      PT SHORT TERM GOAL #2   Title The patient will be able to walk 8-10 min with RW needed for community mobility    Time 4    Period Weeks    Status On-going      PT SHORT TERM GOAL #3   Title The patient will be able to rise from a standard chair with minimal  use of UE assist    Status Achieved      PT SHORT TERM GOAL #4   Title Get baseline TUG and 6 min walk tests    Time 4    Period Weeks    Status Achieved             PT Long Term Goals - 06/21/20 1704      PT LONG TERM GOAL #6   Baseline 68%                 Plan - 06/21/20 1700    Clinical Impression Statement Patient reports continued compliance with HEP twice a day. Reports pain continues to be constant but remains at a lower intensity. Patient was able to tolerate addition of additional standing exercise. Requiring cuing for safe use of SPC with ambulation during gait activity. Noting overall improved quality of movement with bed mobiliy on mat table this date. Patient reporting no increased pain following session. Would benefit from continued skilled intervention for skilled transition out of post-op protocols and improved functional mobility to return to PLOF.    Personal Factors and Comorbidities Comorbidity 1;Comorbidity 2;Comorbidity 3+;Time since onset of injury/illness/exacerbation    Comorbidities DDD, HTN, sleep apnea; recent left LE DVT (on blood thinner); 2 recent surgeries: 12/8 and posterior lumbar fusion 1/19 with no lift >5#, no bending, no twisting    Examination-Activity Limitations Locomotion Level;Transfers;Lift;Hygiene/Grooming;Dressing;Stairs;Stand;Squat;Carry    Examination-Participation Restrictions Meal Prep;Occupation;Community Activity;Driving;Other    Rehab Potential Good    PT Frequency 2x / week    PT Duration 8 weeks    PT Treatment/Interventions ADLs/Self Care Home Management;Aquatic Therapy;Cryotherapy;Electrical Stimulation;Moist Heat;Neuromuscular re-education;Therapeutic exercise;Therapeutic activities;Gait training;Stair training;Patient/family education;Manual techniques;Taping    PT Next Visit Plan continue spinal fusion surgical rehab program; progress low level LE strengthening to patient tolerance; continue follow up for aquatic PT     PT Home Exercise Plan [id number]    Consulted and Agree with Plan of Care Patient           Patient will benefit from skilled therapeutic intervention in order to improve the following deficits and impairments:  Decreased range of motion,Difficulty walking,Decreased activity tolerance,Impaired perceived functional ability,Pain,Decreased strength,Decreased mobility,Impaired flexibility  Visit Diagnosis: Acute bilateral  low back pain, unspecified whether sciatica present  Muscle weakness (generalized)  Difficulty in walking, not elsewhere classified  Pain in left leg     Problem List Patient Active Problem List   Diagnosis Date Noted  . Acute pulmonary embolism (HCC) 05/18/2020  . DVT (deep venous thrombosis) (HCC) 05/18/2020  . OSA (obstructive sleep apnea) 05/18/2020  . Fusion of lumbar spine 05/02/2020  . Degenerative disc disease at L5-S1 level 03/21/2020  . Hypertension 02/24/2020  . BPH with obstruction/lower urinary tract symptoms 05/12/2018  . Erectile dysfunction due to arterial insufficiency 05/12/2018  . Family history of prostate cancer in father 05/12/2018  . Constipation 12/14/2013  . Incomplete emptying of bladder 12/14/2013  . Organic impotence 12/14/2013  . Thrombosed external hemorrhoid 12/14/2013    Delrae Rend Oval Moralez 06/21/2020, 5:06 PM  [facility] [address] [place], [place], [address] Phone: [phone]   Fax:  [phone]  Name: Jason Moore MRN: [id number] Date of Birth: [birth year]

## 2020-06-26 ENCOUNTER — Other Ambulatory Visit: Payer: Self-pay

## 2020-06-26 ENCOUNTER — Ambulatory Visit: Payer: BC Managed Care – PPO | Admitting: Physical Therapy

## 2020-06-26 DIAGNOSIS — M79605 Pain in left leg: Secondary | ICD-10-CM | POA: Diagnosis not present

## 2020-06-26 DIAGNOSIS — M545 Low back pain, unspecified: Secondary | ICD-10-CM

## 2020-06-26 DIAGNOSIS — M6281 Muscle weakness (generalized): Secondary | ICD-10-CM | POA: Diagnosis not present

## 2020-06-26 DIAGNOSIS — R262 Difficulty in walking, not elsewhere classified: Secondary | ICD-10-CM | POA: Diagnosis not present

## 2020-06-26 NOTE — Therapy (Signed)
[facility] [address], [address] [place], [place], [address] Phone: [phone]   Fax:  [phone]  Physical Therapy Treatment  Patient Details  Name: Jason Moore MRN: [id number] Date of Birth: May 13, 1970 Referring Provider (PT): Dr. Venita Lick   Encounter Date: 06/26/2020   PT End of Session - 06/26/20 1720    Visit Number 8    Date for PT Re-Evaluation 09/07/20    Authorization Type BCBS    PT Start Time 1450    PT Stop Time 1529    PT Time Calculation (min) 39 min    Activity Tolerance Patient tolerated treatment well           Past Medical History:  Diagnosis Date  . Anxiety   . Back pain   . Degeneration of lumbar intervertebral disc   . Hypertension   . Isthmic spondylolisthesis   . Joint pain   . Lumbar radiculopathy   . Numbness and tingling   . Sleep apnea    cpap @ [facility]    Past Surgical History:  Procedure Laterality Date  . ABDOMINAL EXPOSURE N/A 03/21/2020   Procedure: ABDOMINAL EXPOSURE;  Surgeon: Larina Earthly, MD;  Location: [facility] OR;  Service: Vascular;  Laterality: N/A;  . ANTERIOR LUMBAR FUSION N/A 03/21/2020   Procedure: ATTEMPTED ANTERIOR LUMBAR FUSION (ALIF) LUMBAR FIVE- SACRAL ONE, POSTERIOR SPINAL FUSION INTERBODY LUMBAR FIVE-SACRAL ONE;  Surgeon: Venita Lick, MD;  [facility]: [facility] OR;  Service: Orthopedics;  Laterality: N/A;  5 HRS Dr. Arbie Cookey to do approach Tap block with exparel  . SHOULDER ARTHROSCOPY  04/15/2007  . TRANSFORAMINAL LUMBAR INTERBODY FUSION (TLIF) WITH PEDICLE SCREW FIXATION 1 LEVEL N/A 05/02/2020   Procedure: TRANSFORAMINAL LUMBAR INTERBODY FUSION (TLIF) LUMBAR 5-SACRAL 1;  Surgeon: Venita Lick, MD;  Location: [facility] OR;  Service: Orthopedics;  Laterality: N/A;  4.5 hrs    There were no vitals filed for this visit.   Subjective Assessment - 06/26/20 1454    Subjective Some front hip pain last night.  Not using lumbar brace at home but only in public.  Continues to  use RW b/c "I don't trust that left leg."    Pertinent History RESTRICTIONS NO LIFT >5#, NO BEND,NO TWIST; NO LIFT OVERHEAD;  Ortho Dr. Venita Lick 06/12/20; Vascular end of Feb    Currently in Pain? Yes    Pain Score 3     Pain Location Back    Pain Type Surgical pain                             [facility] Adult PT Treatment/Exercise - 06/26/20 0001      Lumbar Exercises: Aerobic   UBE (Upper Arm Bike) L1 2 forward/2 backward    Nustep 10 min L1 UE/LE while discussing status, MD visits      Lumbar Exercises: Machines for Strengthening   Leg Press 50# bil 10x; single leg 50# right/left 10x each      Lumbar Exercises: Standing   Row Both;Strengthening;10 reps;Theraband    Theraband Level (Row) Level 2 (Red)    Shoulder Extension Strengthening;Both;10 reps    Theraband Level (Shoulder Extension) Level 2 (Red)      Knee/Hip Exercises: Standing   Hip Flexion Limitations step taps to bottom step 10x    Hip Abduction AROM;Right;Left;1 set;10 reps    Other Standing Knee Exercises weight shifting 1 minute 30 sec side to side, then added fwd/bkw 1 min  Knee/Hip Exercises: Seated   Other Seated Knee/Hip Exercises 15# kettlebell resting on knee with heel raises 10x right/left    Sit to Sand 10 reps   black foam in chair                 PT Education - 06/26/20 1542    Education Details pool info    Person(s) Educated Patient    Methods Explanation;Demonstration;Handout    Comprehension Returned demonstration;Verbalized understanding            PT Short Term Goals - 06/15/20 1258      PT SHORT TERM GOAL #1   Status Achieved      PT SHORT TERM GOAL #2   Title The patient will be able to walk 8-10 min with RW needed for community mobility    Time 4    Period Weeks    Status On-going      PT SHORT TERM GOAL #3   Title The patient will be able to rise from a standard chair with minimal use of UE assist    Status Achieved      PT SHORT TERM GOAL #4    Title Get baseline TUG and 6 min walk tests    Time 4    Period Weeks    Status Achieved             PT Long Term Goals - 06/21/20 1704      PT LONG TERM GOAL #6   Baseline 68%                 Plan - 06/26/20 1721    Clinical Impression Statement The patient is able to progress intensity of exercise with additional time in standing, leg press and the inclusion of more aerobic exercise.  He wears his brace while on the Nu-Step but removes it for the remainder of ex.  He reports continued left hip/groin pain at the end of the session but no worse.  He is excited to try aquatic PT this week.  He is concerned about his ability to attend a concert this weekend and how his pain and mobility will be.  Therapist monitoring response throughout treatment session.    Comorbidities DDD, HTN, sleep apnea; recent left LE DVT (on blood thinner); 2 recent surgeries: 12/8 and posterior lumbar fusion 1/19 with no lift >5#, no bending, no twisting    Examination-Activity Limitations Locomotion Level;Transfers;Lift;Hygiene/Grooming;Dressing;Stairs;Stand;Squat;Carry    Rehab Potential Good    PT Frequency 2x / week    PT Duration 8 weeks    PT Treatment/Interventions ADLs/Self Care Home Management;Aquatic Therapy;Cryotherapy;Electrical Stimulation;Moist Heat;Neuromuscular re-education;Therapeutic exercise;Therapeutic activities;Gait training;Stair training;Patient/family education;Manual techniques;Taping    PT Next Visit Plan continue spinal fusion surgical rehab program; progress low level LE strengthening to patient tolerance;  initiate aquatic PT    PT Home Exercise Plan [phone]           Patient will benefit from skilled therapeutic intervention in order to improve the following deficits and impairments:  Decreased range of motion,Difficulty walking,Decreased activity tolerance,Impaired perceived functional ability,Pain,Decreased strength,Decreased mobility,Impaired flexibility  Visit  Diagnosis: Acute bilateral low back pain, unspecified whether sciatica present  Muscle weakness (generalized)  Difficulty in walking, not elsewhere classified  Pain in left leg     Problem List Patient Active Problem List   Diagnosis Date Noted  . Acute pulmonary embolism (HCC) 05/18/2020  . DVT (deep venous thrombosis) (HCC) 05/18/2020  . OSA (obstructive sleep apnea) 05/18/2020  . Fusion  of lumbar spine 05/02/2020  . Degenerative disc disease at L5-S1 level 03/21/2020  . Hypertension 02/24/2020  . BPH with obstruction/lower urinary tract symptoms 05/12/2018  . Erectile dysfunction due to arterial insufficiency 05/12/2018  . Family history of prostate cancer in father 05/12/2018  . Constipation 12/14/2013  . Incomplete emptying of bladder 12/14/2013  . Organic impotence 12/14/2013  . Thrombosed external hemorrhoid 12/14/2013   Lavinia Sharps, PT 06/26/20 5:27 PM Phone: [phone] Fax: [phone] Vivien Presto 06/26/2020, 5:26 PM  [facility] [address] [place], [place], [address] Phone: [phone]   Fax:  [phone]  Name: RYLER LASKOWSKI MRN: [id number] Date of Birth: 02-07-1971

## 2020-06-26 NOTE — Patient Instructions (Signed)
     [facility] Welcome to  Aquatics! Here you will find all the information you will need regarding your pool therapy. If you have further questions at any time, please call our office at [phone]. After completing your initial evaluation in the [facility], you may be eligible to complete a portion of your therapy in the pool. A typical week of therapy will consist of 1-2 typical physical therapy visits at our [facility] location and an additional session of therapy in the pool located at the [facility] at [address]. [address], [place] [address]. The phone number at the pool site is [phone]. Please call this number if you are running late or need to cancel your appointment.  Aquatic therapy will be offered on Friday afternoons. Each session will last approximately 45 minutes. All scheduling and payments for aquatic therapy sessions, including cancelations, will be done through our [facility] location.  To be eligible for aquatic therapy, these criteria must be met: . You must be able to independently change in the locker room and get to the pool deck. A caregiver can come with you to help if needed. There are bleachers for a caregiver to sit on next to the pool. . No one with an open wound is permitted in the pool.  Handicap parking is available in the front and there is a drop off option for even closer accessibility. Please arrive 15 minutes prior to your appointment to prepare for your pool session. You must sign in at the front desk upon your arrival. Please be sure to attend to any toileting needs prior to entering the pool. Locker rooms for changing are located to the right of the check-in desk. There is direct access to the pool deck from the locker room. You can lock your belongings in a locker but must bring a lock. Your therapist will greet you on the pool deck. There may be other swimmers in the pool at  the same time but your session is one-on-one with the therapist.   

## 2020-06-29 ENCOUNTER — Other Ambulatory Visit: Payer: Self-pay

## 2020-06-29 ENCOUNTER — Encounter: Payer: Self-pay | Admitting: Physical Therapy

## 2020-06-29 ENCOUNTER — Ambulatory Visit: Payer: BC Managed Care – PPO | Admitting: Physical Therapy

## 2020-06-29 DIAGNOSIS — M79605 Pain in left leg: Secondary | ICD-10-CM

## 2020-06-29 DIAGNOSIS — M6281 Muscle weakness (generalized): Secondary | ICD-10-CM | POA: Diagnosis not present

## 2020-06-29 DIAGNOSIS — M545 Low back pain, unspecified: Secondary | ICD-10-CM | POA: Diagnosis not present

## 2020-06-29 DIAGNOSIS — R262 Difficulty in walking, not elsewhere classified: Secondary | ICD-10-CM

## 2020-06-29 NOTE — Therapy (Signed)
[facility] [address] [place], [place], [address] Phone: [phone]   Fax:  [phone]  Physical Therapy Treatment  Patient Details  Name: Jason Moore MRN: [id number] Date of Birth: 28-Sep-1970 Referring Provider (PT): Dr. Venita Lick   Encounter Date: 06/29/2020   PT End of Session - 06/29/20 1458    Visit Number 9    Authorization Type BCBS    PT Start Time 1215    PT Stop Time 1300    PT Time Calculation (min) 45 min    Activity Tolerance Patient tolerated treatment well    Behavior During Therapy [facility] for tasks assessed/performed           Past Medical History:  Diagnosis Date  . Anxiety   . Back pain   . Degeneration of lumbar intervertebral disc   . Hypertension   . Isthmic spondylolisthesis   . Joint pain   . Lumbar radiculopathy   . Numbness and tingling   . Sleep apnea    cpap @ [facility]    Past Surgical History:  Procedure Laterality Date  . ABDOMINAL EXPOSURE N/A 03/21/2020   Procedure: ABDOMINAL EXPOSURE;  Surgeon: Larina Earthly, MD;  Location: [facility] OR;  Service: Vascular;  Laterality: N/A;  . ANTERIOR LUMBAR FUSION N/A 03/21/2020   Procedure: ATTEMPTED ANTERIOR LUMBAR FUSION (ALIF) LUMBAR FIVE- SACRAL ONE, POSTERIOR SPINAL FUSION INTERBODY LUMBAR FIVE-SACRAL ONE;  Surgeon: Venita Lick, MD;  Location: [facility] OR;  Service: Orthopedics;  Laterality: N/A;  5 HRS Dr. Arbie Cookey to do approach Tap block with exparel  . SHOULDER ARTHROSCOPY  04/15/2007  . TRANSFORAMINAL LUMBAR INTERBODY FUSION (TLIF) WITH PEDICLE SCREW FIXATION 1 LEVEL N/A 05/02/2020   Procedure: TRANSFORAMINAL LUMBAR INTERBODY FUSION (TLIF) LUMBAR 5-SACRAL 1;  Surgeon: Venita Lick, MD;  Location: [facility] OR;  Service: Orthopedics;  Laterality: N/A;  4.5 hrs    There were no vitals filed for this visit.   Subjective Assessment - 06/29/20 1456    Subjective I am getting stronger, less pain, I am trying to walk around my house without  my walker now.    Pertinent History RESTRICTIONS NO LIFT >5#, NO BEND,NO TWIST; NO LIFT OVERHEAD;  Ortho Dr. Venita Lick 06/12/20; Vascular end of Feb    Currently in Pain? Yes    Pain Score 3     Pain Location Back    Pain Orientation Mid;Left    Pain Descriptors / Indicators Aching    Aggravating Factors  Maybe if I do too much?    Pain Relieving Factors Exercising helps    Multiple Pain Sites No          Treatment: Aquatics Patient seen for aquatic therapy today.  Treatment took place in water 3.5-4 feet deep depending upon activity.  Pt entered the pool via stairs with heavy use of hand rails. Water temp 90 degrees F  Seated water bench with 75% submersion Pt performed and educated in seated LE AROM exercises 20x in all planes, pt also educated in water principles we will be using throughout the course of his aquatic PT.   Water walking 4 directions 3/4 length pf pool in all 4 directions 4 laps using the blue block noodle for extra thoracic support so pt could avoid forward stooping. VC for heel toe walking while walking forward.  Wall exercises in mid thoracic depth: heel raises 10x bil, hip 4 ways 10x bil, mini squats 10x,   Purple aquajogger and blue block noodle behind  pt for decompression float x                           PT Education - 06/29/20 1458    Education Details Water principles we will use during aquatic PT    Person(s) Educated Patient    Methods Explanation    Comprehension Verbalized understanding            PT Short Term Goals - 06/15/20 1258      PT SHORT TERM GOAL #1   Status Achieved      PT SHORT TERM GOAL #2   Title The patient will be able to walk 8-10 min with RW needed for community mobility    Time 4    Period Weeks    Status On-going      PT SHORT TERM GOAL #3   Title The patient will be able to rise from a standard chair with minimal use of UE assist    Status Achieved      PT SHORT TERM GOAL #4   Title  Get baseline TUG and 6 min walk tests    Time 4    Period Weeks    Status Achieved             PT Long Term Goals - 06/21/20 1704      PT LONG TERM GOAL #6   Baseline 68%                 Plan - 06/29/20 1500    Clinical Impression Statement Pt arrives for first aquatics PT session and reports mild back pain. Pt reports trying to walk more at home without his walker. Pt was educated in how to use the water principles to achieve his goals. Pt had no increased pain while exercising in the water, using bouyancy mainly for assistance with all mobility.    Personal Factors and Comorbidities Comorbidity 1;Comorbidity 2;Comorbidity 3+;Time since onset of injury/illness/exacerbation    Comorbidities DDD, HTN, sleep apnea; recent left LE DVT (on blood thinner); 2 recent surgeries: 12/8 and posterior lumbar fusion 1/19 with no lift >5#, no bending, no twisting    Examination-Activity Limitations Locomotion Level;Transfers;Lift;Hygiene/Grooming;Dressing;Stairs;Stand;Squat;Carry    Examination-Participation Restrictions Meal Prep;Occupation;Community Activity;Driving;Other    Stability/Clinical Decision Making Evolving/Moderate complexity    Rehab Potential Good    PT Frequency 2x / week    PT Duration 8 weeks    PT Treatment/Interventions ADLs/Self Care Home Management;Aquatic Therapy;Cryotherapy;Electrical Stimulation;Moist Heat;Neuromuscular re-education;Therapeutic exercise;Therapeutic activities;Gait training;Stair training;Patient/family education;Manual techniques;Taping    PT Next Visit Plan continue spinal fusion surgical rehab program; progress low level LE strengthening to patient tolerance;    PT Home Exercise Plan [id number]    Consulted and Agree with Plan of Care Patient           Patient will benefit from skilled therapeutic intervention in order to improve the following deficits and impairments:  Decreased range of motion,Difficulty walking,Decreased activity  tolerance,Impaired perceived functional ability,Pain,Decreased strength,Decreased mobility,Impaired flexibility  Visit Diagnosis: Acute bilateral low back pain, unspecified whether sciatica present  Muscle weakness (generalized)  Difficulty in walking, not elsewhere classified  Pain in left leg     Problem List Patient Active Problem List   Diagnosis Date Noted  . Acute pulmonary embolism (HCC) 05/18/2020  . DVT (deep venous thrombosis) (HCC) 05/18/2020  . OSA (obstructive sleep apnea) 05/18/2020  . Fusion of lumbar spine 05/02/2020  . Degenerative disc disease at L5-S1 level 03/21/2020  .  Hypertension 02/24/2020  . BPH with obstruction/lower urinary tract symptoms 05/12/2018  . Erectile dysfunction due to arterial insufficiency 05/12/2018  . Family history of prostate cancer in father 05/12/2018  . Constipation 12/14/2013  . Incomplete emptying of bladder 12/14/2013  . Organic impotence 12/14/2013  . Thrombosed external hemorrhoid 12/14/2013    Yonathan Perrow, PTA 06/29/2020, 3:06 PM  [facility] [address] [place], [place], [address] Phone: [phone]   Fax:  [phone]  Name: Jason Moore MRN: [id number] Date of Birth: May 27, 1970

## 2020-07-02 ENCOUNTER — Encounter: Payer: Self-pay | Admitting: Physical Therapy

## 2020-07-02 ENCOUNTER — Ambulatory Visit: Payer: BC Managed Care – PPO | Admitting: Physical Therapy

## 2020-07-02 ENCOUNTER — Other Ambulatory Visit: Payer: Self-pay

## 2020-07-02 DIAGNOSIS — M545 Low back pain, unspecified: Secondary | ICD-10-CM

## 2020-07-02 DIAGNOSIS — R262 Difficulty in walking, not elsewhere classified: Secondary | ICD-10-CM

## 2020-07-02 DIAGNOSIS — M6281 Muscle weakness (generalized): Secondary | ICD-10-CM | POA: Diagnosis not present

## 2020-07-02 DIAGNOSIS — M79605 Pain in left leg: Secondary | ICD-10-CM

## 2020-07-02 NOTE — Therapy (Signed)
[facility] [address] [place], [place], [address] Phone: [phone]   Fax:  [phone]  Physical Therapy Treatment  Patient Details  Name: Jason Moore MRN: [id number] Date of Birth: 1970/06/22 Referring Provider (PT): Dr. Venita Lick   Encounter Date: 07/02/2020   PT End of Session - 07/02/20 1210    Visit Number 10    Date for PT Re-Evaluation 09/07/20    Authorization Type BCBS    PT Start Time 1157   pt late almost 15 min   PT Stop Time 1237    PT Time Calculation (min) 40 min    Activity Tolerance Patient tolerated treatment well    Behavior During Therapy [facility] for tasks assessed/performed           Past Medical History:  Diagnosis Date  . Anxiety   . Back pain   . Degeneration of lumbar intervertebral disc   . Hypertension   . Isthmic spondylolisthesis   . Joint pain   . Lumbar radiculopathy   . Numbness and tingling   . Sleep apnea    cpap @ [facility]    Past Surgical History:  Procedure Laterality Date  . ABDOMINAL EXPOSURE N/A 03/21/2020   Procedure: ABDOMINAL EXPOSURE;  Surgeon: Larina Earthly, MD;  Location: [facility] OR;  Service: Vascular;  Laterality: N/A;  . ANTERIOR LUMBAR FUSION N/A 03/21/2020   Procedure: ATTEMPTED ANTERIOR LUMBAR FUSION (ALIF) LUMBAR FIVE- SACRAL ONE, POSTERIOR SPINAL FUSION INTERBODY LUMBAR FIVE-SACRAL ONE;  Surgeon: Venita Lick, MD;  Location: [facility] OR;  Service: Orthopedics;  Laterality: N/A;  5 HRS Dr. Arbie Cookey to do approach Tap block with exparel  . SHOULDER ARTHROSCOPY  04/15/2007  . TRANSFORAMINAL LUMBAR INTERBODY FUSION (TLIF) WITH PEDICLE SCREW FIXATION 1 LEVEL N/A 05/02/2020   Procedure: TRANSFORAMINAL LUMBAR INTERBODY FUSION (TLIF) LUMBAR 5-SACRAL 1;  Surgeon: Venita Lick, MD;  Location: [facility] OR;  Service: Orthopedics;  Laterality: N/A;  4.5 hrs    There were no vitals filed for this visit.   Subjective Assessment - 07/02/20 1159    Subjective pt ambulating  with 2 walking sticks now. Improved posture.the pool was great, i was tired for 2 days after.    Pertinent History RESTRICTIONS NO LIFT >5#, NO BEND,NO TWIST; NO LIFT OVERHEAD;  Ortho Dr. Venita Lick 06/12/20; Vascular end of Feb    Currently in Pain? Yes    Pain Location Back    Pain Orientation Lower    Pain Descriptors / Indicators Dull;Aching    Aggravating Factors  Overdoing    Pain Relieving Factors moving    Multiple Pain Sites No                             [facility] Adult PT Treatment/Exercise - 07/02/20 0001      Lumbar Exercises: Aerobic   Nustep L3 x 10 min to end session      Lumbar Exercises: Machines for Strengthening   Leg Press Seat 9: Bil 65# 15x, Unilateral 65# 10x each leg      Lumbar Exercises: Standing   Row Both;Strengthening;10 reps;Theraband    Theraband Level (Row) Level 3 (Green)    Shoulder Extension AROM;Strengthening;Both;10 reps;Theraband    Theraband Level (Shoulder Extension) Level 3 (Green)      Lumbar Exercises: Supine   Clam 20 reps    Clam Limitations yellow loop    Heel Slides 10 reps   Bil   Bent  Knee Raise 10 reps    Other Supine Lumbar Exercises Red band horizontal abd with core contraction 10x2      Knee/Hip Exercises: Standing   Hip Flexion Limitations step taps to bottom step 10x   second set VC to choose 1 hand only for support                   PT Short Term Goals - 06/15/20 1258      PT SHORT TERM GOAL #1   Status Achieved      PT SHORT TERM GOAL #2   Title The patient will be able to walk 8-10 min with RW needed for community mobility    Time 4    Period Weeks    Status On-going      PT SHORT TERM GOAL #3   Title The patient will be able to rise from a standard chair with minimal use of UE assist    Status Achieved      PT SHORT TERM GOAL #4   Title Get baseline TUG and 6 min walk tests    Time 4    Period Weeks    Status Achieved             PT Long Term Goals - 06/21/20 1704       PT LONG TERM GOAL #6   Baseline 68%                 Plan - 07/02/20 1203    Clinical Impression Statement Pt now ambulating with 2 walking sticks. This has improved his posture when upright. Pt did well exercising in the pool, only fatigue post. Pt participated in a variety of exercises for core and LE strength supine, and standing. Pt did very well on the leg press, increasing the resistance without issues.    Personal Factors and Comorbidities Comorbidity 1;Comorbidity 2;Comorbidity 3+;Time since onset of injury/illness/exacerbation    Comorbidities DDD, HTN, sleep apnea; recent left LE DVT (on blood thinner); 2 recent surgeries: 12/8 and posterior lumbar fusion 1/19 with no lift >5#, no bending, no twisting    Examination-Activity Limitations Locomotion Level;Transfers;Lift;Hygiene/Grooming;Dressing;Stairs;Stand;Squat;Carry    Examination-Participation Restrictions Meal Prep;Occupation;Community Activity;Driving;Other    Stability/Clinical Decision Making Evolving/Moderate complexity    Rehab Potential Good    PT Frequency 2x / week    PT Duration 8 weeks    PT Treatment/Interventions ADLs/Self Care Home Management;Aquatic Therapy;Cryotherapy;Electrical Stimulation;Moist Heat;Neuromuscular re-education;Therapeutic exercise;Therapeutic activities;Gait training;Stair training;Patient/family education;Manual techniques;Taping    PT Next Visit Plan continue spinal fusion surgical rehab program; progress low level LE strengthening to patient tolerance; aquatics    PT Home Exercise Plan [phone]    Consulted and Agree with Plan of Care Patient           Patient will benefit from skilled therapeutic intervention in order to improve the following deficits and impairments:  Decreased range of motion,Difficulty walking,Decreased activity tolerance,Impaired perceived functional ability,Pain,Decreased strength,Decreased mobility,Impaired flexibility  Visit Diagnosis: Acute bilateral low  back pain, unspecified whether sciatica present  Muscle weakness (generalized)  Difficulty in walking, not elsewhere classified  Pain in left leg     Problem List Patient Active Problem List   Diagnosis Date Noted  . Acute pulmonary embolism (HCC) 05/18/2020  . DVT (deep venous thrombosis) (HCC) 05/18/2020  . OSA (obstructive sleep apnea) 05/18/2020  . Fusion of lumbar spine 05/02/2020  . Degenerative disc disease at L5-S1 level 03/21/2020  . Hypertension 02/24/2020  . BPH with obstruction/lower urinary tract symptoms  05/12/2018  . Erectile dysfunction due to arterial insufficiency 05/12/2018  . Family history of prostate cancer in father 05/12/2018  . Constipation 12/14/2013  . Incomplete emptying of bladder 12/14/2013  . Organic impotence 12/14/2013  . Thrombosed external hemorrhoid 12/14/2013    Jason Moore, PTA 07/02/2020, 12:31 PM   [facility] [address] [place], [place], [address] Phone: [phone]   Fax:  [phone]  Name: Jason Moore MRN: [id number] Date of Birth: 14-Jul-1970

## 2020-07-09 ENCOUNTER — Encounter: Payer: Self-pay | Admitting: Physical Therapy

## 2020-07-09 ENCOUNTER — Ambulatory Visit: Payer: BC Managed Care – PPO | Admitting: Physical Therapy

## 2020-07-09 ENCOUNTER — Other Ambulatory Visit: Payer: Self-pay

## 2020-07-09 DIAGNOSIS — M6281 Muscle weakness (generalized): Secondary | ICD-10-CM

## 2020-07-09 DIAGNOSIS — R262 Difficulty in walking, not elsewhere classified: Secondary | ICD-10-CM

## 2020-07-09 DIAGNOSIS — M545 Low back pain, unspecified: Secondary | ICD-10-CM | POA: Diagnosis not present

## 2020-07-09 DIAGNOSIS — M79605 Pain in left leg: Secondary | ICD-10-CM | POA: Diagnosis not present

## 2020-07-09 NOTE — Therapy (Signed)
[facility] [address] [place], [place], [address] Phone: [phone]   Fax:  [phone]  Physical Therapy Treatment  Patient Details  Name: Jason Moore MRN: [id number] Date of Birth: 20-Apr-1970 Referring Provider (PT): Dr. Venita Lick   Encounter Date: 07/09/2020   PT End of Session - 07/09/20 1226    Visit Number 11    Date for PT Re-Evaluation 09/07/20    Authorization Type BCBS    PT Start Time 1151    PT Stop Time 1230    PT Time Calculation (min) 39 min    Activity Tolerance Patient tolerated treatment well    Behavior During Therapy [facility] for tasks assessed/performed           Past Medical History:  Diagnosis Date  . Anxiety   . Back pain   . Degeneration of lumbar intervertebral disc   . Hypertension   . Isthmic spondylolisthesis   . Joint pain   . Lumbar radiculopathy   . Numbness and tingling   . Sleep apnea    cpap @ [facility]    Past Surgical History:  Procedure Laterality Date  . ABDOMINAL EXPOSURE N/A 03/21/2020   Procedure: ABDOMINAL EXPOSURE;  Surgeon: Larina Earthly, MD;  Location: [facility] OR;  Service: Vascular;  Laterality: N/A;  . ANTERIOR LUMBAR FUSION N/A 03/21/2020   Procedure: ATTEMPTED ANTERIOR LUMBAR FUSION (ALIF) LUMBAR FIVE- SACRAL ONE, POSTERIOR SPINAL FUSION INTERBODY LUMBAR FIVE-SACRAL ONE;  Surgeon: Venita Lick, MD;  Location: [facility] OR;  Service: Orthopedics;  Laterality: N/A;  5 HRS Dr. Arbie Cookey to do approach Tap block with exparel  . SHOULDER ARTHROSCOPY  04/15/2007  . TRANSFORAMINAL LUMBAR INTERBODY FUSION (TLIF) WITH PEDICLE SCREW FIXATION 1 LEVEL N/A 05/02/2020   Procedure: TRANSFORAMINAL LUMBAR INTERBODY FUSION (TLIF) LUMBAR 5-SACRAL 1;  Surgeon: Venita Lick, MD;  Location: [facility] OR;  Service: Orthopedics;  Laterality: N/A;  4.5 hrs    There were no vitals filed for this visit.   Subjective Assessment - 07/09/20 1153    Subjective Pt ambulating with SPC today.  Pt is  feeling better.  Pt states he is still wearing compression sock with swelling.  Pt is having back pain some days to 5/10 but usually 2-3.Marland Kitchen  Denies pain in the leg    Pertinent History RESTRICTIONS NO LIFT >5#, NO BEND,NO TWIST; NO LIFT OVERHEAD;  Ortho Dr. Venita Lick 06/12/20; Vascular end of Feb    Patient Stated Goals out of brace, off walker, walk for 5 minutes without pain    Currently in Pain? Yes    Pain Score 3     Pain Location Back    Pain Orientation Lower    Pain Descriptors / Indicators Aching;Dull    Pain Type Surgical pain    Pain Onset More than a month ago    Pain Frequency Intermittent    Aggravating Factors  too much activity    Pain Relieving Factors exercises    Multiple Pain Sites No                             [facility] Adult PT Treatment/Exercise - 07/09/20 0001      Lumbar Exercises: Aerobic   UBE (Upper Arm Bike) L1 2 forward/2 backward    Nustep L6 x 5 min to end session      Lumbar Exercises: Machines for Strengthening   Leg Press Seat 9: Bil 85# 15x, Unilateral 65#  10x each leg      Lumbar Exercises: Standing   Row Both;Strengthening;10 reps;Power Water engineer Extension AROM;Strengthening;Both;10 reps;Power Chief of Staff Limitations 15#      Lumbar Exercises: Seated   Sit to Stand 20 reps   mat table sitting on foam   Sit to Stand Limitations 10x no weight; 10x 5lb    Other Seated Lumbar Exercises bicep curl with 5lb kettle bell - core engaged with lifting      Lumbar Exercises: Supine   Clam 20 reps    Clam Limitations green band    Bent Knee Raise 10 reps    Bent Knee Raise Limitations green band    Other Supine Lumbar Exercises green band horizontal abd with core contraction 10x2                    PT Short Term Goals - 06/15/20 1258      PT SHORT TERM GOAL #1   Status Achieved      PT SHORT TERM GOAL #2   Title The patient will be able to walk 8-10 min  with RW needed for community mobility    Time 4    Period Weeks    Status On-going      PT SHORT TERM GOAL #3   Title The patient will be able to rise from a standard chair with minimal use of UE assist    Status Achieved      PT SHORT TERM GOAL #4   Title Get baseline TUG and 6 min walk tests    Time 4    Period Weeks    Status Achieved             PT Long Term Goals - 06/21/20 1704      PT LONG TERM GOAL #6   Baseline 68%                 Plan - 07/09/20 1229    Clinical Impression Statement Pt did well and able to increase resistance.  Pt is having less pain overall and not down into the leg.  Pt is walking with SPC.  He is showing excellent progress and recommended to continue skilled therapy to work towards functional goals.    PT Treatment/Interventions ADLs/Self Care Home Management;Aquatic Therapy;Cryotherapy;Electrical Stimulation;Moist Heat;Neuromuscular re-education;Therapeutic exercise;Therapeutic activities;Gait training;Stair training;Patient/family education;Manual techniques;Taping    PT Next Visit Plan continue spinal fusion surgical rehab program; progress low level LE strengthening to patient tolerance; aquatics    PT Home Exercise Plan [phone]    Consulted and Agree with Plan of Care Patient           Patient will benefit from skilled therapeutic intervention in order to improve the following deficits and impairments:  Decreased range of motion,Difficulty walking,Decreased activity tolerance,Impaired perceived functional ability,Pain,Decreased strength,Decreased mobility,Impaired flexibility  Visit Diagnosis: Acute bilateral low back pain, unspecified whether sciatica present  Muscle weakness (generalized)  Difficulty in walking, not elsewhere classified  Pain in left leg     Problem List Patient Active Problem List   Diagnosis Date Noted  . Acute pulmonary embolism (HCC) 05/18/2020  . DVT (deep venous thrombosis) (HCC) 05/18/2020  .  OSA (obstructive sleep apnea) 05/18/2020  . Fusion of lumbar spine 05/02/2020  . Degenerative disc disease at L5-S1 level 03/21/2020  . Hypertension 02/24/2020  . BPH with obstruction/lower urinary tract symptoms 05/12/2018  . Erectile dysfunction  due to arterial insufficiency 05/12/2018  . Family history of prostate cancer in father 05/12/2018  . Constipation 12/14/2013  . Incomplete emptying of bladder 12/14/2013  . Organic impotence 12/14/2013  . Thrombosed external hemorrhoid 12/14/2013    Junious Silk, PT 07/09/2020, 1:56 PM  [facility] [address] [place], [place], [address] Phone: [phone]   Fax:  [phone]  Name: Jason Moore MRN: [id number] Date of Birth: 02-Feb-1971

## 2020-07-13 ENCOUNTER — Ambulatory Visit: Payer: BC Managed Care – PPO | Admitting: Physical Therapy

## 2020-07-13 ENCOUNTER — Ambulatory Visit: Payer: BC Managed Care – PPO | Attending: Internal Medicine | Admitting: Physical Therapy

## 2020-07-13 ENCOUNTER — Other Ambulatory Visit: Payer: Self-pay

## 2020-07-13 ENCOUNTER — Encounter: Payer: Self-pay | Admitting: Physical Therapy

## 2020-07-13 DIAGNOSIS — M79605 Pain in left leg: Secondary | ICD-10-CM | POA: Diagnosis not present

## 2020-07-13 DIAGNOSIS — R262 Difficulty in walking, not elsewhere classified: Secondary | ICD-10-CM | POA: Diagnosis not present

## 2020-07-13 DIAGNOSIS — M545 Low back pain, unspecified: Secondary | ICD-10-CM | POA: Diagnosis not present

## 2020-07-13 DIAGNOSIS — M6281 Muscle weakness (generalized): Secondary | ICD-10-CM | POA: Insufficient documentation

## 2020-07-13 NOTE — Therapy (Signed)
[facility] [address], [address] [place], [place], [address] Phone: [phone]   Fax:  [phone]  Physical Therapy Treatment  Patient Details  Name: Jason Moore MRN: [id number] Date of Birth: 08-06-1970 Referring Provider (PT): Dr. Venita Lick   Encounter Date: 07/13/2020   PT End of Session - 07/13/20 1539    Visit Number 12    Date for PT Re-Evaluation 09/07/20    Authorization Type BCBS    PT Start Time 1300    PT Stop Time 1345    PT Time Calculation (min) 45 min    Activity Tolerance Patient tolerated treatment well    Behavior During Therapy [facility] for tasks assessed/performed           Past Medical History:  Diagnosis Date  . Anxiety   . Back pain   . Degeneration of lumbar intervertebral disc   . Hypertension   . Isthmic spondylolisthesis   . Joint pain   . Lumbar radiculopathy   . Numbness and tingling   . Sleep apnea    cpap @ [facility]    Past Surgical History:  Procedure Laterality Date  . ABDOMINAL EXPOSURE N/A 03/21/2020   Procedure: ABDOMINAL EXPOSURE;  Surgeon: Larina Earthly, MD;  Location: [facility] OR;  Service: Vascular;  Laterality: N/A;  . ANTERIOR LUMBAR FUSION N/A 03/21/2020   Procedure: ATTEMPTED ANTERIOR LUMBAR FUSION (ALIF) LUMBAR FIVE- SACRAL ONE, POSTERIOR SPINAL FUSION INTERBODY LUMBAR FIVE-SACRAL ONE;  Surgeon: Venita Lick, MD;  Location: [facility] OR;  Service: Orthopedics;  Laterality: N/A;  5 HRS Dr. Arbie Cookey to do approach Tap block with exparel  . SHOULDER ARTHROSCOPY  04/15/2007  . TRANSFORAMINAL LUMBAR INTERBODY FUSION (TLIF) WITH PEDICLE SCREW FIXATION 1 LEVEL N/A 05/02/2020   Procedure: TRANSFORAMINAL LUMBAR INTERBODY FUSION (TLIF) LUMBAR 5-SACRAL 1;  Surgeon: Venita Lick, MD;  Location: [facility] OR;  Service: Orthopedics;  Laterality: N/A;  4.5 hrs    There were no vitals filed for this visit.   Subjective Assessment - 07/13/20 1537    Subjective Trying to wean off my medications (  oxy, muscle relaxer, Gabapentin ) so I notice some more pain.    Pertinent History RESTRICTIONS NO LIFT >5#, NO BEND,NO TWIST; NO LIFT OVERHEAD;  Ortho Dr. Venita Lick 06/12/20; Vascular end of Feb    Currently in Pain? Yes    Pain Score 3     Pain Location Back    Pain Orientation Lower    Pain Descriptors / Indicators Dull;Aching    Multiple Pain Sites No         Treatment: Aquatic session Patient seen for aquatic therapy today.  Treatment took place in water 3.5-4 feet deep depending upon activity.  Pt entered the pool via stairs, needs extra time exiting pool today.  Seated water bench with 75% submersion Pt performed seated LE AROM exercises 20x in all planes, Verbally educated pt to work on increasing his walking speed today and focus on increasing ROM and pulling LE back towards midline to work on hip strength with standing exercises. Pt verbally agreed to try.  Standing water walking from 3.5 ft to 4.5 ft: each direction 6x with increased speed and arm movements. Standing wall exercises: No UE to increase core activation: 15x heel raises, abduction, flex/ext, squats MArching across short length of pool 4x, no assistive device for balance.  Aquajogger and noodle for decompression float 5 min at end of session, then 3 min of bicyle LE, 2 more min of  decompression for lumbar.                              PT Short Term Goals - 06/15/20 1258      PT SHORT TERM GOAL #1   Status Achieved      PT SHORT TERM GOAL #2   Title The patient will be able to walk 8-10 min with RW needed for community mobility    Time 4    Period Weeks    Status On-going      PT SHORT TERM GOAL #3   Title The patient will be able to rise from a standard chair with minimal use of UE assist    Status Achieved      PT SHORT TERM GOAL #4   Title Get baseline TUG and 6 min walk tests    Time 4    Period Weeks    Status Achieved             PT Long Term Goals - 06/21/20  1704      PT LONG TERM GOAL #6   Baseline 68%                 Plan - 07/13/20 1539    Clinical Impression Statement Pt arrives for aquatic PT today with mild low back pain. Todays session focused on increasing the speed of his movements so not to be too cautious & guarded with his movements. He did this with success, no increased back or leg pain. Increasing his speed increased the resistance to his LE and AROM/flexibility of mainly his hips.    Personal Factors and Comorbidities Comorbidity 1;Comorbidity 2;Comorbidity 3+;Time since onset of injury/illness/exacerbation    Comorbidities DDD, HTN, sleep apnea; recent left LE DVT (on blood thinner); 2 recent surgeries: 12/8 and posterior lumbar fusion 1/19 with no lift >5#, no bending, no twisting    Examination-Activity Limitations Locomotion Level;Transfers;Lift;Hygiene/Grooming;Dressing;Stairs;Stand;Squat;Carry    Examination-Participation Restrictions Meal Prep;Occupation;Community Activity;Driving;Other    Stability/Clinical Decision Making Evolving/Moderate complexity    Rehab Potential Good    PT Frequency 2x / week    PT Duration 8 weeks    PT Treatment/Interventions ADLs/Self Care Home Management;Aquatic Therapy;Cryotherapy;Electrical Stimulation;Moist Heat;Neuromuscular re-education;Therapeutic exercise;Therapeutic activities;Gait training;Stair training;Patient/family education;Manual techniques;Taping    PT Next Visit Plan continue spinal fusion surgical rehab program; progress low level LE strengthening to patient tolerance; aquatics    Consulted and Agree with Plan of Care Patient           Patient will benefit from skilled therapeutic intervention in order to improve the following deficits and impairments:  Decreased range of motion,Difficulty walking,Decreased activity tolerance,Impaired perceived functional ability,Pain,Decreased strength,Decreased mobility,Impaired flexibility  Visit Diagnosis: Acute bilateral low  back pain, unspecified whether sciatica present  Muscle weakness (generalized)  Difficulty in walking, not elsewhere classified  Pain in left leg     Problem List Patient Active Problem List   Diagnosis Date Noted  . Acute pulmonary embolism (HCC) 05/18/2020  . DVT (deep venous thrombosis) (HCC) 05/18/2020  . OSA (obstructive sleep apnea) 05/18/2020  . Fusion of lumbar spine 05/02/2020  . Degenerative disc disease at L5-S1 level 03/21/2020  . Hypertension 02/24/2020  . BPH with obstruction/lower urinary tract symptoms 05/12/2018  . Erectile dysfunction due to arterial insufficiency 05/12/2018  . Family history of prostate cancer in father 05/12/2018  . Constipation 12/14/2013  . Incomplete emptying of bladder 12/14/2013  . Organic impotence 12/14/2013  .  Thrombosed external hemorrhoid 12/14/2013    Jaaziah Schulke, PTA 07/13/2020, 3:43 PM  [facility] [address], [address] [place], [place], [address] Phone: [phone]   Fax:  [phone]  Name: Jason Moore MRN: [id number] Date of Birth: [birth year]

## 2020-07-16 ENCOUNTER — Ambulatory Visit: Payer: BC Managed Care – PPO | Admitting: Physical Therapy

## 2020-07-16 ENCOUNTER — Encounter: Payer: Self-pay | Admitting: Physical Therapy

## 2020-07-16 ENCOUNTER — Other Ambulatory Visit: Payer: Self-pay

## 2020-07-16 DIAGNOSIS — M545 Low back pain, unspecified: Secondary | ICD-10-CM

## 2020-07-16 DIAGNOSIS — M79605 Pain in left leg: Secondary | ICD-10-CM

## 2020-07-16 DIAGNOSIS — R262 Difficulty in walking, not elsewhere classified: Secondary | ICD-10-CM

## 2020-07-16 DIAGNOSIS — M6281 Muscle weakness (generalized): Secondary | ICD-10-CM

## 2020-07-16 NOTE — Therapy (Signed)
[facility] [address] [place], [place], [address] Phone: [phone]   Fax:  [phone]  Physical Therapy Treatment  Patient Details  Name: Jason Moore MRN: [id number] Date of Birth: [birth year] Referring Provider (PT): Dr. Venita Lick   Encounter Date: 07/16/2020   PT End of Session - 07/16/20 1406    Visit Number 13    Date for PT Re-Evaluation 09/07/20    Authorization Type BCBS    PT Start Time 1402    PT Stop Time 1442    PT Time Calculation (min) 40 min    Activity Tolerance Patient tolerated treatment well    Behavior During Therapy [facility] for tasks assessed/performed           Past Medical History:  Diagnosis Date  . Anxiety   . Back pain   . Degeneration of lumbar intervertebral disc   . Hypertension   . Isthmic spondylolisthesis   . Joint pain   . Lumbar radiculopathy   . Numbness and tingling   . Sleep apnea    cpap @ [facility]    Past Surgical History:  Procedure Laterality Date  . ABDOMINAL EXPOSURE N/A 03/21/2020   Procedure: ABDOMINAL EXPOSURE;  Surgeon: Larina Earthly, MD;  Location: [facility] OR;  Service: Vascular;  Laterality: N/A;  . ANTERIOR LUMBAR FUSION N/A 03/21/2020   Procedure: ATTEMPTED ANTERIOR LUMBAR FUSION (ALIF) LUMBAR FIVE- SACRAL ONE, POSTERIOR SPINAL FUSION INTERBODY LUMBAR FIVE-SACRAL ONE;  Surgeon: Venita Lick, MD;  Location: [facility] OR;  Service: Orthopedics;  Laterality: N/A;  5 HRS Dr. Arbie Cookey to do approach Tap block with exparel  . SHOULDER ARTHROSCOPY  04/15/2007  . TRANSFORAMINAL LUMBAR INTERBODY FUSION (TLIF) WITH PEDICLE SCREW FIXATION 1 LEVEL N/A 05/02/2020   Procedure: TRANSFORAMINAL LUMBAR INTERBODY FUSION (TLIF) LUMBAR 5-SACRAL 1;  Surgeon: Venita Lick, MD;  Location: [facility] OR;  Service: Orthopedics;  Laterality: N/A;  4.5 hrs    There were no vitals filed for this visit.   Subjective Assessment - 07/16/20 1408    Subjective I was super tired after the pool. No  pain, just fatigue. Pt continues to wean off his meds. He is totally off his muscle relaxers.    Pertinent History RESTRICTIONS NO LIFT >5#, NO BEND,NO TWIST; NO LIFT OVERHEAD;  Ortho Dr. Venita Lick 06/12/20; Vascular end of Feb    Currently in Pain? No/denies                             [facility] Adult PT Treatment/Exercise - 07/16/20 0001      Lumbar Exercises: Aerobic   Stationary Bike L2 x 6 min    UBE (Upper Arm Bike) L 1.4 3x3      Lumbar Exercises: Machines for Strengthening   Leg Press Seat 9: Bil 85# x10 90# 10x, Unilateral 65# 15x      Lumbar Exercises: Standing   Row Both;Strengthening;10 reps;Power Environmental manager Extension AROM;Strengthening;Both;10 reps;Power Chief of Staff Limitations 15# power tower      Lumbar Exercises: Seated   Sit to Stand 10 reps;Other (comment)   with 5# bicep curl upon standing     Lumbar Exercises: Supine   Clam 20 reps   yellow loop   Bent Knee Raise 20 reps    Bent Knee Raise Limitations yellow loop    Other Supine Lumbar Exercises blue band horizontal  abd 15x with core contraction                    PT Short Term Goals - 06/15/20 1258      PT SHORT TERM GOAL #1   Status Achieved      PT SHORT TERM GOAL #2   Title The patient will be able to walk 8-10 min with RW needed for community mobility    Time 4    Period Weeks    Status On-going      PT SHORT TERM GOAL #3   Title The patient will be able to rise from a standard chair with minimal use of UE assist    Status Achieved      PT SHORT TERM GOAL #4   Title Get baseline TUG and 6 min walk tests    Time 4    Period Weeks    Status Achieved             PT Long Term Goals - 06/21/20 1704      PT LONG TERM GOAL #6   Baseline 68%                 Plan - 07/16/20 1406    Clinical Impression Statement Pain reducing, not as frequent nor intense, this as pt is weaning off his medications.  Pt typically with fatigue after aquatics. No pain with todays exercise progressions for strength and endurance.    Personal Factors and Comorbidities Comorbidity 1;Comorbidity 2;Comorbidity 3+;Time since onset of injury/illness/exacerbation    Comorbidities DDD, HTN, sleep apnea; recent left LE DVT (on blood thinner); 2 recent surgeries: 12/8 and posterior lumbar fusion 1/19 with no lift >5#, no bending, no twisting    Examination-Activity Limitations Locomotion Level;Transfers;Lift;Hygiene/Grooming;Dressing;Stairs;Stand;Squat;Carry    Examination-Participation Restrictions Meal Prep;Occupation;Community Activity;Driving;Other    Stability/Clinical Decision Making Evolving/Moderate complexity    Rehab Potential Good    PT Frequency 2x / week    PT Duration 8 weeks    PT Treatment/Interventions ADLs/Self Care Home Management;Aquatic Therapy;Cryotherapy;Electrical Stimulation;Moist Heat;Neuromuscular re-education;Therapeutic exercise;Therapeutic activities;Gait training;Stair training;Patient/family education;Manual techniques;Taping    PT Next Visit Plan continue spinal fusion surgical rehab program; progress low level LE strengthening to patient tolerance; aquatics    PT Home Exercise Plan [phone]           Patient will benefit from skilled therapeutic intervention in order to improve the following deficits and impairments:  Decreased range of motion,Difficulty walking,Decreased activity tolerance,Impaired perceived functional ability,Pain,Decreased strength,Decreased mobility,Impaired flexibility  Visit Diagnosis: Acute bilateral low back pain, unspecified whether sciatica present  Muscle weakness (generalized)  Difficulty in walking, not elsewhere classified  Pain in left leg     Problem List Patient Active Problem List   Diagnosis Date Noted  . Acute pulmonary embolism (HCC) 05/18/2020  . DVT (deep venous thrombosis) (HCC) 05/18/2020  . OSA (obstructive sleep apnea) 05/18/2020   . Fusion of lumbar spine 05/02/2020  . Degenerative disc disease at L5-S1 level 03/21/2020  . Hypertension 02/24/2020  . BPH with obstruction/lower urinary tract symptoms 05/12/2018  . Erectile dysfunction due to arterial insufficiency 05/12/2018  . Family history of prostate cancer in father 05/12/2018  . Constipation 12/14/2013  . Incomplete emptying of bladder 12/14/2013  . Organic impotence 12/14/2013  . Thrombosed external hemorrhoid 12/14/2013    Lisa-Marie Rueger, PTA 07/16/2020, 2:37 PM  [facility] [address], [address] [place], [place], [address] Phone: [phone]   Fax:  [phone]  Name: Jason  DEARIES Moore MRN: [id number] Date of Birth: 1970-10-06

## 2020-07-19 ENCOUNTER — Encounter: Payer: BC Managed Care – PPO | Admitting: Physical Therapy

## 2020-07-20 ENCOUNTER — Other Ambulatory Visit: Payer: Self-pay

## 2020-07-20 ENCOUNTER — Ambulatory Visit: Payer: BC Managed Care – PPO | Admitting: Physical Therapy

## 2020-07-20 ENCOUNTER — Encounter: Payer: Self-pay | Admitting: Physical Therapy

## 2020-07-20 DIAGNOSIS — M79605 Pain in left leg: Secondary | ICD-10-CM

## 2020-07-20 DIAGNOSIS — M545 Low back pain, unspecified: Secondary | ICD-10-CM

## 2020-07-20 DIAGNOSIS — R262 Difficulty in walking, not elsewhere classified: Secondary | ICD-10-CM | POA: Diagnosis not present

## 2020-07-20 DIAGNOSIS — M6281 Muscle weakness (generalized): Secondary | ICD-10-CM | POA: Diagnosis not present

## 2020-07-20 NOTE — Therapy (Signed)
[facility] [address], [address] [place], [place], [address] Phone: [phone]   Fax:  [phone]  Physical Therapy Treatment  Patient Details  Name: Jason Moore MRN: [id number] Date of Birth: [birth year] Referring Provider (PT): Dr. Venita Lick   Encounter Date: 07/20/2020   PT End of Session - 07/20/20 1521    Visit Number 14    Date for PT Re-Evaluation 09/07/20    Authorization Type BCBS    PT Start Time 1215    PT Stop Time 1305    PT Time Calculation (min) 50 min    Activity Tolerance Patient tolerated treatment well    Behavior During Therapy [facility] for tasks assessed/performed           Past Medical History:  Diagnosis Date  . Anxiety   . Back pain   . Degeneration of lumbar intervertebral disc   . Hypertension   . Isthmic spondylolisthesis   . Joint pain   . Lumbar radiculopathy   . Numbness and tingling   . Sleep apnea    cpap @ [facility]    Past Surgical History:  Procedure Laterality Date  . ABDOMINAL EXPOSURE N/A 03/21/2020   Procedure: ABDOMINAL EXPOSURE;  Surgeon: Larina Earthly, MD;  Location: [facility] OR;  Service: Vascular;  Laterality: N/A;  . ANTERIOR LUMBAR FUSION N/A 03/21/2020   Procedure: ATTEMPTED ANTERIOR LUMBAR FUSION (ALIF) LUMBAR FIVE- SACRAL ONE, POSTERIOR SPINAL FUSION INTERBODY LUMBAR FIVE-SACRAL ONE;  Surgeon: Venita Lick, MD;  Location: [facility] OR;  Service: Orthopedics;  Laterality: N/A;  5 HRS Dr. Arbie Cookey to do approach Tap block with exparel  . SHOULDER ARTHROSCOPY  04/15/2007  . TRANSFORAMINAL LUMBAR INTERBODY FUSION (TLIF) WITH PEDICLE SCREW FIXATION 1 LEVEL N/A 05/02/2020   Procedure: TRANSFORAMINAL LUMBAR INTERBODY FUSION (TLIF) LUMBAR 5-SACRAL 1;  Surgeon: Venita Lick, MD;  Location: [facility] OR;  Service: Orthopedics;  Laterality: N/A;  4.5 hrs    There were no vitals filed for this visit.   Subjective Assessment - 07/20/20 1519    Subjective Some back pain today. I see my doctor  next week. I am only taking 1/2 Gabapentin per day now. I also walked 1/2 mile yesterday.    Pertinent History RESTRICTIONS NO LIFT >5#, NO BEND,NO TWIST; NO LIFT OVERHEAD;  Ortho Dr. Venita Lick 06/12/20; Vascular end of Feb    Currently in Pain? Yes    Pain Score 4     Pain Location Back    Pain Orientation Lower    Pain Descriptors / Indicators Sore    Aggravating Factors  Maybe if i overdo it?    Pain Relieving Factors Rest, exercise, aquatic exercise    Multiple Pain Sites No          Treatment: Aquatics Patient seen for aquatic therapy today.  Treatment took place in water 3.5-4.5 feet deep depending upon activity.  Pt entered the pool via stairs, exiting with extra time. Pool temp 90 degrees F.  Seated water bench with 75% submersion Pt performed seated LE AROM exercises 20x in all planes, with concurrent discussion of status and pain assessment.  Water walking from 3.5 feet to 4.5 long way length of the pool: 6x in each direction with VC to swing arms greater and move his LE as fast as he felt comfortable doing at this time. Standing at mid chest depth for wall exercises mostly with no UE support for balance: 20x marching, circumduction, abd/add, hip swings, squats, knee extensions added today and  heel raises all Bil. Also added hip/knee extensions pressing small blue noodle down 10x Bil with VC for control.  Hamstring stretching bil 2x 30 sec at second step holding onto railings for support.  Intermittent decompression float with underwater bicycle each 2-3 min in length for total of 8 min with aquajogger and small blue noodle for cervical support.                             PT Short Term Goals - 06/15/20 1258      PT SHORT TERM GOAL #1   Status Achieved      PT SHORT TERM GOAL #2   Title The patient will be able to walk 8-10 min with RW needed for community mobility    Time 4    Period Weeks    Status On-going      PT SHORT TERM GOAL #3    Title The patient will be able to rise from a standard chair with minimal use of UE assist    Status Achieved      PT SHORT TERM GOAL #4   Title Get baseline TUG and 6 min walk tests    Time 4    Period Weeks    Status Achieved             PT Long Term Goals - 06/21/20 1704      PT LONG TERM GOAL #6   Baseline 68%                 Plan - 07/20/20 1522    Clinical Impression Statement Pt reports he is down to 1/2 Gabapentin 1x day now. He walked 1/2 mile for first time yesterday as part of his exercise home program. Pt continues to be encouraged to move bigger and faster in the water to generate current and create more resisatnce which he tolerates very well. Mostly fatigue post aquatics and no increase in pain.    Personal Factors and Comorbidities Comorbidity 1;Comorbidity 2;Comorbidity 3+;Time since onset of injury/illness/exacerbation    Comorbidities DDD, HTN, sleep apnea; recent left LE DVT (on blood thinner); 2 recent surgeries: 12/8 and posterior lumbar fusion 1/19 with no lift >5#, no bending, no twisting    Examination-Activity Limitations Locomotion Level;Transfers;Lift;Hygiene/Grooming;Dressing;Stairs;Stand;Squat;Carry    Examination-Participation Restrictions Meal Prep;Occupation;Community Activity;Driving;Other    Stability/Clinical Decision Making Evolving/Moderate complexity    Rehab Potential Good    PT Frequency 2x / week    PT Duration 8 weeks    PT Treatment/Interventions ADLs/Self Care Home Management;Aquatic Therapy;Cryotherapy;Electrical Stimulation;Moist Heat;Neuromuscular re-education;Therapeutic exercise;Therapeutic activities;Gait training;Stair training;Patient/family education;Manual techniques;Taping    PT Next Visit Plan continue spinal fusion surgical rehab program; progress low level LE strengthening to patient tolerance; aquatics    PT Home Exercise Plan [phone]    Consulted and Agree with Plan of Care Patient           Patient will  benefit from skilled therapeutic intervention in order to improve the following deficits and impairments:  Decreased range of motion,Difficulty walking,Decreased activity tolerance,Impaired perceived functional ability,Pain,Decreased strength,Decreased mobility,Impaired flexibility  Visit Diagnosis: Acute bilateral low back pain, unspecified whether sciatica present  Muscle weakness (generalized)  Difficulty in walking, not elsewhere classified  Pain in left leg     Problem List Patient Active Problem List   Diagnosis Date Noted  . Acute pulmonary embolism (HCC) 05/18/2020  . DVT (deep venous thrombosis) (HCC) 05/18/2020  . OSA (obstructive sleep apnea) 05/18/2020  .  Fusion of lumbar spine 05/02/2020  . Degenerative disc disease at L5-S1 level 03/21/2020  . Hypertension 02/24/2020  . BPH with obstruction/lower urinary tract symptoms 05/12/2018  . Erectile dysfunction due to arterial insufficiency 05/12/2018  . Family history of prostate cancer in father 05/12/2018  . Constipation 12/14/2013  . Incomplete emptying of bladder 12/14/2013  . Organic impotence 12/14/2013  . Thrombosed external hemorrhoid 12/14/2013    Maricus Tanzi, PTA 07/20/2020, 3:25 PM  [facility] [address] [place], [place], [address] Phone: [phone]   Fax:  [phone]  Name: MARCELUS DUBBERLY MRN: [id number] Date of Birth: 1970-05-28

## 2020-07-24 DIAGNOSIS — Z4889 Encounter for other specified surgical aftercare: Secondary | ICD-10-CM | POA: Diagnosis not present

## 2020-07-26 ENCOUNTER — Other Ambulatory Visit: Payer: Self-pay

## 2020-07-26 ENCOUNTER — Ambulatory Visit: Payer: BC Managed Care – PPO | Admitting: Physical Therapy

## 2020-07-26 DIAGNOSIS — M6281 Muscle weakness (generalized): Secondary | ICD-10-CM | POA: Diagnosis not present

## 2020-07-26 DIAGNOSIS — M545 Low back pain, unspecified: Secondary | ICD-10-CM

## 2020-07-26 DIAGNOSIS — M79605 Pain in left leg: Secondary | ICD-10-CM

## 2020-07-26 DIAGNOSIS — R262 Difficulty in walking, not elsewhere classified: Secondary | ICD-10-CM

## 2020-07-26 NOTE — Therapy (Signed)
[facility] [address], [address] [place], [place], [address] Phone: [phone]   Fax:  [phone]  Physical Therapy Treatment  Patient Details  Name: Jason Moore MRN: [id number] Date of Birth: Nov 06, 1970 Referring Provider (PT): Dr. Venita Lick   Encounter Date: 07/26/2020   PT End of Session - 07/26/20 1722    Visit Number 15    Date for PT Re-Evaluation 09/07/20    Authorization Type BCBS    PT Start Time 1454    PT Stop Time 1534    PT Time Calculation (min) 40 min    Activity Tolerance Patient tolerated treatment well           Past Medical History:  Diagnosis Date  . Anxiety   . Back pain   . Degeneration of lumbar intervertebral disc   . Hypertension   . Isthmic spondylolisthesis   . Joint pain   . Lumbar radiculopathy   . Numbness and tingling   . Sleep apnea    cpap @ [facility]    Past Surgical History:  Procedure Laterality Date  . ABDOMINAL EXPOSURE N/A 03/21/2020   Procedure: ABDOMINAL EXPOSURE;  Surgeon: Larina Earthly, MD;  Location: [facility] OR;  Service: Vascular;  Laterality: N/A;  . ANTERIOR LUMBAR FUSION N/A 03/21/2020   Procedure: ATTEMPTED ANTERIOR LUMBAR FUSION (ALIF) LUMBAR FIVE- SACRAL ONE, POSTERIOR SPINAL FUSION INTERBODY LUMBAR FIVE-SACRAL ONE;  Surgeon: Venita Lick, MD;  Location: [facility] OR;  Service: Orthopedics;  Laterality: N/A;  5 HRS Dr. Arbie Cookey to do approach Tap block with exparel  . SHOULDER ARTHROSCOPY  04/15/2007  . TRANSFORAMINAL LUMBAR INTERBODY FUSION (TLIF) WITH PEDICLE SCREW FIXATION 1 LEVEL N/A 05/02/2020   Procedure: TRANSFORAMINAL LUMBAR INTERBODY FUSION (TLIF) LUMBAR 5-SACRAL 1;  Surgeon: Venita Lick, MD;  Location: [facility] OR;  Service: Orthopedics;  Laterality: N/A;  4.5 hrs    There were no vitals filed for this visit.   Subjective Assessment - 07/26/20 1458    Subjective I saw Dr. Shon Baton yesterday.  X-rays/hardward looked good.  He thought I could finish PT in 2 weeks  and do it at home.  Using SPC.  Uses walking poles to walk 1/2 mile.  Returning to work next week.  I'm off the pain meds.  States he has 10# and 15# kettlebells at home.  My leg doesn't hurt now.    Pertinent History RESTRICTIONS NO LIFT >10#, NO BEND,NO TWIST; NO LIFT OVERHEAD;  Ortho Dr. Venita Lick 06/12/20; Vascular end of Feb    Currently in Pain? Yes    Pain Score 3     Pain Location Back                             [facility] Adult PT Treatment/Exercise - 07/26/20 0001      Therapeutic Activites    ADL's standing, walking, climbing steps, light lifting      Lumbar Exercises: Stretches   Other Lumbar Stretch Exercise 2nd step hip flexor strength 10x right/left      Lumbar Exercises: Aerobic   Nustep L3 3 min while discussing progress      Lumbar Exercises: Standing   Shoulder Extension Strengthening;Both;20 reps;Theraband    Theraband Level (Shoulder Extension) Level 3 (Green)    Shoulder Extension Limitations 2nd set with marching/knee lifts    Other Standing Lumbar Exercises 5# snatch to shoulder no overhead 10x right/left      Lumbar Exercises: Seated  Sit to Stand 10 reps    Sit to Stand Limitations 10#    Other Seated Lumbar Exercises heel raise with 10# 2x10    Other Seated Lumbar Exercises partial dead lifts to knee level 10#      Knee/Hip Exercises: Standing   Forward Step Up Right;Left;1 set;10 reps;Hand Hold: 0;Step Height: 6"    Forward Step Up Limitations holding 5# kettlebell                  PT Education - 07/26/20 1721    Education Details sit to stand with 10# kettlebell; partial dead lifts 10#; step ups; bil shoulder extension with marching    Person(s) Educated Patient    Methods Explanation;Demonstration;Handout    Comprehension Returned demonstration;Verbalized understanding            PT Short Term Goals - 07/26/20 1731      PT SHORT TERM GOAL #1   Title The patient will demonstrate knowledge of and compliance with  initial HEP    Status Achieved      PT SHORT TERM GOAL #2   Title The patient will be able to walk 8-10 min with RW needed for community mobility    Status Achieved      PT SHORT TERM GOAL #3   Title The patient will be able to rise from a standard chair with minimal use of UE assist    Status Achieved      PT SHORT TERM GOAL #4   Title Get baseline TUG and 6 min walk tests    Status Achieved             PT Long Term Goals - 07/26/20 1732      PT LONG TERM GOAL #1   Title The patient will be independent with safe self progression of HEP    Time 8    Period Weeks    Status On-going    Target Date 09/07/20      PT LONG TERM GOAL #2   Title The patient will report a 40% improvement in  left LE pain with home ADLs and return to work duties    Time 12    Period Weeks    Status On-going      PT LONG TERM GOAL #3   Title The patient will be able to ambulate household distances with single point cane    Status Achieved      PT LONG TERM GOAL #4   Title Improved gait speed indicated by TUG time to 18 sec or less and 6 min walk test to 800 feet    Time 12    Status On-going      PT LONG TERM GOAL #5   Title Bil LE strength improved to 4/5 to 4+/5 needed to ascend /descend steps at home, ease with sit to stand and to lift/carry light objects at home    Time 12    Period Weeks    Status On-going      PT LONG TERM GOAL #6   Title Modified Oswestry Score improved by at least 8 points    Time 12    Period Weeks    Status On-going                 Plan - 07/26/20 1502    Clinical Impression Statement The patient presents with a SPC he uses for light support secondary to occasional sharper pain.  He had a visit with surgeon  yesterday who increased his lifting limit, released him to return to work next week and encouraged his completion of PT in a few weeks to an independent HEP.  The patient is able to progress general strengthening within lifting limits with verbal  cues for more hip flexion with hip hinge during sit to stands, mini dead lifts and squats.  Overall much improved mobility, gait speed and pain reports since this therapist last saw him a month ago.  He reports muscular fatigue at an appropriate level post exercise but no exacerbation of pain.    Personal Factors and Comorbidities Comorbidity 1;Comorbidity 2;Comorbidity 3+;Time since onset of injury/illness/exacerbation    Comorbidities DDD, HTN, sleep apnea; recent left LE DVT (on blood thinner); 2 recent surgeries: 12/8 and posterior lumbar fusion 1/19 with no lift >10#, no bending, no twisting    Examination-Activity Limitations Locomotion Level;Transfers;Lift;Hygiene/Grooming;Dressing;Stairs;Stand;Squat;Carry    Examination-Participation Restrictions Meal Prep;Occupation;Community Activity;Driving;Other    Stability/Clinical Decision Making Evolving/Moderate complexity    Rehab Potential Good    PT Frequency 2x / week    PT Duration 8 weeks    PT Treatment/Interventions ADLs/Self Care Home Management;Aquatic Therapy;Cryotherapy;Electrical Stimulation;Moist Heat;Neuromuscular re-education;Therapeutic exercise;Therapeutic activities;Gait training;Stair training;Patient/family education;Manual techniques;Taping    PT Next Visit Plan continue spinal fusion surgical rehab program; trunk and LE strengthening progression to prepare for return to work and future discharge to HEP (2-3 weeks per Dr. Shon Baton);   aquatics;  and TUG rechecks; Modified Oswestry recheck; overall % improvement   PT Home Exercise Plan [phone]           Patient will benefit from skilled therapeutic intervention in order to improve the following deficits and impairments:  Decreased range of motion,Difficulty walking,Decreased activity tolerance,Impaired perceived functional ability,Pain,Decreased strength,Decreased mobility,Impaired flexibility  Visit Diagnosis: Acute bilateral low back pain, unspecified whether sciatica  present  Muscle weakness (generalized)  Difficulty in walking, not elsewhere classified  Pain in left leg     Problem List Patient Active Problem List   Diagnosis Date Noted  . Acute pulmonary embolism (HCC) 05/18/2020  . DVT (deep venous thrombosis) (HCC) 05/18/2020  . OSA (obstructive sleep apnea) 05/18/2020  . Fusion of lumbar spine 05/02/2020  . Degenerative disc disease at L5-S1 level 03/21/2020  . Hypertension 02/24/2020  . BPH with obstruction/lower urinary tract symptoms 05/12/2018  . Erectile dysfunction due to arterial insufficiency 05/12/2018  . Family history of prostate cancer in father 05/12/2018  . Constipation 12/14/2013  . Incomplete emptying of bladder 12/14/2013  . Organic impotence 12/14/2013  . Thrombosed external hemorrhoid 12/14/2013   Lavinia Sharps, PT 07/26/20 5:34 PM Phone: [phone] Fax: [phone] Vivien Presto 07/26/2020, 5:33 PM  [facility] [address] [place], [place], [address] Phone: [phone]   Fax:  [phone]  Name: Jason Moore MRN: [id number] Date of Birth: 08-22-1970

## 2020-07-26 NOTE — Patient Instructions (Signed)
Access Code: [email or web address] URL: [email or web address] Date: 07/26/2020 Prepared by: Lavinia Sharps  Exercises Supine Sciatic Nerve Glide - 1 x daily - 7 x weekly - 1 sets - 10 reps Standing Heel Raise with Support - 1 x daily - 7 x weekly - 1 sets - 10 reps Heel Toe Raises with Counter Support - 1 x daily - 7 x weekly - 1 sets - 10 reps Side to Side Weight Shift with Unilateral Counter Support - 1 x daily - 7 x weekly - 1 sets - 10 reps Standing Hip Abduction with Counter Support - 1 x daily - 7 x weekly - 1 sets - 10 reps Push-Up on Counter - 1 x daily - 7 x weekly - 1 sets - 10 reps Alternating Step Taps with Counter Support - 1 x daily - 7 x weekly - 1 sets - 10 reps Mini Squat with Counter Support - 1 x daily - 7 x weekly - 1 sets - 10 reps Supine Transversus Abdominis Bracing - Hands on Stomach - 1 x daily - 7 x weekly - 1 sets - 10 reps Supine Transversus Abdominis Bracing with Heel Slide - 1 x daily - 7 x weekly - 1 sets - 10 reps Pilates upper extremity elevation with lat pull downs - 1 x daily - 7 x weekly - 1 sets - 10 reps SEATED ARM PULSES - 1 x daily - 7 x weekly - 1 sets - 10 reps Hooklying Clamshell with Resistance - 1 x daily - 7 x weekly - 3 sets - 10 reps Sit to Stand - 1 x daily - 7 x weekly - 1 sets - 10 reps Half Deadlift with Kettlebell - 1 x daily - 7 x weekly - 2 sets - 10 reps Resistance Pulldown with March - 1 x daily - 7 x weekly - 2 sets - 10 reps Step Up - 1 x daily - 7 x weekly - 2 sets - 10 reps

## 2020-07-30 ENCOUNTER — Encounter: Payer: BC Managed Care – PPO | Admitting: Physical Therapy

## 2020-08-01 ENCOUNTER — Other Ambulatory Visit: Payer: Self-pay

## 2020-08-01 ENCOUNTER — Encounter: Payer: Self-pay | Admitting: Physical Therapy

## 2020-08-01 ENCOUNTER — Ambulatory Visit: Payer: BC Managed Care – PPO | Admitting: Physical Therapy

## 2020-08-01 DIAGNOSIS — M545 Low back pain, unspecified: Secondary | ICD-10-CM | POA: Diagnosis not present

## 2020-08-01 DIAGNOSIS — R262 Difficulty in walking, not elsewhere classified: Secondary | ICD-10-CM | POA: Diagnosis not present

## 2020-08-01 DIAGNOSIS — M79605 Pain in left leg: Secondary | ICD-10-CM | POA: Diagnosis not present

## 2020-08-01 DIAGNOSIS — M6281 Muscle weakness (generalized): Secondary | ICD-10-CM | POA: Diagnosis not present

## 2020-08-01 NOTE — Therapy (Signed)
[facility] [address] [place], [place], [address] Phone: [phone]   Fax:  [phone]  Physical Therapy Treatment  Patient Details  Name: Jason Moore MRN: [id number] Date of Birth: 25-Dec-1970 Referring Provider (PT): Dr. Venita Lick   Encounter Date: 08/01/2020   PT End of Session - 08/01/20 1138    Visit Number 16    Date for PT Re-Evaluation 09/07/20    Authorization Type BCBS    PT Start Time 1104    PT Stop Time 1150    PT Time Calculation (min) 46 min           Past Medical History:  Diagnosis Date  . Anxiety   . Back pain   . Degeneration of lumbar intervertebral disc   . Hypertension   . Isthmic spondylolisthesis   . Joint pain   . Lumbar radiculopathy   . Numbness and tingling   . Sleep apnea    cpap @ [facility]    Past Surgical History:  Procedure Laterality Date  . ABDOMINAL EXPOSURE N/A 03/21/2020   Procedure: ABDOMINAL EXPOSURE;  Surgeon: Larina Earthly, MD;  Location: [facility] OR;  Service: Vascular;  Laterality: N/A;  . ANTERIOR LUMBAR FUSION N/A 03/21/2020   Procedure: ATTEMPTED ANTERIOR LUMBAR FUSION (ALIF) LUMBAR FIVE- SACRAL ONE, POSTERIOR SPINAL FUSION INTERBODY LUMBAR FIVE-SACRAL ONE;  Surgeon: Venita Lick, MD;  Location: [facility] OR;  Service: Orthopedics;  Laterality: N/A;  5 HRS Dr. Arbie Cookey to do approach Tap block with exparel  . SHOULDER ARTHROSCOPY  04/15/2007  . TRANSFORAMINAL LUMBAR INTERBODY FUSION (TLIF) WITH PEDICLE SCREW FIXATION 1 LEVEL N/A 05/02/2020   Procedure: TRANSFORAMINAL LUMBAR INTERBODY FUSION (TLIF) LUMBAR 5-SACRAL 1;  Surgeon: Venita Lick, MD;  Location: [facility] OR;  Service: Orthopedics;  Laterality: N/A;  4.5 hrs    There were no vitals filed for this visit.   Subjective Assessment - 08/01/20 1105    Subjective My back has been super sore since returning to work. I was really worked last sesssion, " I was wiped out."    Currently in Pain? Yes    Pain Score 6      Pain Location Back    Pain Orientation Lower;Mid    Pain Descriptors / Indicators Sore    Aggravating Factors  Return to work has been difficult    Pain Relieving Factors rest    Multiple Pain Sites No                             [facility] Adult PT Treatment/Exercise - 08/01/20 0001      Lumbar Exercises: Aerobic   Nustep L3 x discussing status      Lumbar Exercises: Standing   Shoulder Extension Strengthening;Both;20 reps;Theraband   good/proper review   Theraband Level (Shoulder Extension) Level 3 (Green)    Shoulder Extension Limitations 2nd set with marching    Other Standing Lumbar Exercises 5# snatch to shoulder no overhead 10x right/left      Lumbar Exercises: Seated   Sit to Stand 10 reps   2 x 10 on mat table with black pad   Sit to Stand Limitations 10#    Other Seated Lumbar Exercises heel raise with 10# 2x10    Other Seated Lumbar Exercises partial dead lifts to knee level 10#   2x10     Moist Heat Therapy   Number Minutes Moist Heat 15 Minutes    Moist Heat  Location Lumbar Spine      Electrical Stimulation   Electrical Stimulation Location Central lumbar    Electrical Stimulation Action IFC 80-150 HZ    Electrical Stimulation Goals Pain   Hooklying post session                 PT Education - 08/01/20 1135    Education Details Instructed pt to use TENS unit while at work    [employer]) Educated Patient    Methods Explanation    Comprehension Verbalized understanding            PT Short Term Goals - 07/26/20 1731      PT SHORT TERM GOAL #1   Title The patient will demonstrate knowledge of and compliance with initial HEP    Status Achieved      PT SHORT TERM GOAL #2   Title The patient will be able to walk 8-10 min with RW needed for community mobility    Status Achieved      PT SHORT TERM GOAL #3   Title The patient will be able to rise from a standard chair with minimal use of UE assist    Status Achieved      PT  SHORT TERM GOAL #4   Title Get baseline TUG and 6 min walk tests    Status Achieved             PT Long Term Goals - 07/26/20 1732      PT LONG TERM GOAL #1   Title The patient will be independent with safe self progression of HEP    Time 8    Period Weeks    Status On-going    Target Date 09/07/20      PT LONG TERM GOAL #2   Title The patient will report a 40% improvement in  left LE pain with home ADLs and return to work duties    Time 12    Period Weeks    Status On-going      PT LONG TERM GOAL #3   Title The patient will be able to ambulate household distances with single point cane    Status Achieved      PT LONG TERM GOAL #4   Title Improved gait speed indicated by TUG time to 18 sec or less and 6 min walk test to 800 feet    Time 12    Status On-going      PT LONG TERM GOAL #5   Title Bil LE strength improved to 4/5 to 4+/5 needed to ascend /descend steps at home, ease with sit to stand and to lift/carry light objects at home    Time 12    Period Weeks    Status On-going      PT LONG TERM GOAL #6   Title Modified Oswestry Score improved by at least 8 points    Time 12    Period Weeks    Status On-going                 Plan - 08/01/20 1135    Clinical Impression Statement Pt presents today with increased back pain that he attributes to his return to work. Pain remains centralized in his lumbar. Post exercises we used IFC current as pt is returning to work after session. Pt finds new exercises appropriately challenging "they wipe me out." Pt corretcly demonstrated all new exercises given last. PTA suggested to pt to bring his TENS unit to work  and use. Pt agreed to try this suggestion.    Personal Factors and Comorbidities Comorbidity 1;Comorbidity 2;Comorbidity 3+;Time since onset of injury/illness/exacerbation    Comorbidities DDD, HTN, sleep apnea; recent left LE DVT (on blood thinner); 2 recent surgeries: 12/8 and posterior lumbar fusion 1/19 with no  lift >10#, no bending, no twisting    Examination-Activity Limitations Locomotion Level;Transfers;Lift;Hygiene/Grooming;Dressing;Stairs;Stand;Squat;Carry    Examination-Participation Restrictions Meal Prep;Occupation;Community Activity;Driving;Other    Stability/Clinical Decision Making Evolving/Moderate complexity    Rehab Potential Good    PT Frequency 2x / week    PT Duration 8 weeks    PT Treatment/Interventions ADLs/Self Care Home Management;Aquatic Therapy;Cryotherapy;Electrical Stimulation;Moist Heat;Neuromuscular re-education;Therapeutic exercise;Therapeutic activities;Gait training;Stair training;Patient/family education;Manual techniques;Taping    PT Next Visit Plan continue spinal fusion surgical rehab program; trunk and LE strengthening progression to prepare for return to work and future discharge to HEP;   aquatics;  and TUG rechecks; Modified Oswestry recheck    PT Home Exercise Plan [phone]    Consulted and Agree with Plan of Care Patient           Patient will benefit from skilled therapeutic intervention in order to improve the following deficits and impairments:  Decreased range of motion,Difficulty walking,Decreased activity tolerance,Impaired perceived functional ability,Pain,Decreased strength,Decreased mobility,Impaired flexibility  Visit Diagnosis: Acute bilateral low back pain, unspecified whether sciatica present  Muscle weakness (generalized)  Difficulty in walking, not elsewhere classified  Pain in left leg     Problem List Patient Active Problem List   Diagnosis Date Noted  . Acute pulmonary embolism (HCC) 05/18/2020  . DVT (deep venous thrombosis) (HCC) 05/18/2020  . OSA (obstructive sleep apnea) 05/18/2020  . Fusion of lumbar spine 05/02/2020  . Degenerative disc disease at L5-S1 level 03/21/2020  . Hypertension 02/24/2020  . BPH with obstruction/lower urinary tract symptoms 05/12/2018  . Erectile dysfunction due to arterial insufficiency  05/12/2018  . Family history of prostate cancer in father 05/12/2018  . Constipation 12/14/2013  . Incomplete emptying of bladder 12/14/2013  . Organic impotence 12/14/2013  . Thrombosed external hemorrhoid 12/14/2013    Jason Moore, PTA 08/01/2020, 11:41 AM  [facility] [address] [place], [place], [address] Phone: [phone]   Fax:  [phone]  Name: Jason Moore MRN: [id number] Date of Birth: [birth year]

## 2020-08-03 ENCOUNTER — Encounter: Payer: Self-pay | Admitting: Physical Therapy

## 2020-08-03 ENCOUNTER — Ambulatory Visit: Payer: BC Managed Care – PPO | Admitting: Physical Therapy

## 2020-08-03 ENCOUNTER — Other Ambulatory Visit: Payer: Self-pay

## 2020-08-03 DIAGNOSIS — R262 Difficulty in walking, not elsewhere classified: Secondary | ICD-10-CM | POA: Diagnosis not present

## 2020-08-03 DIAGNOSIS — M6281 Muscle weakness (generalized): Secondary | ICD-10-CM | POA: Diagnosis not present

## 2020-08-03 DIAGNOSIS — M545 Low back pain, unspecified: Secondary | ICD-10-CM

## 2020-08-03 DIAGNOSIS — M79605 Pain in left leg: Secondary | ICD-10-CM | POA: Diagnosis not present

## 2020-08-03 NOTE — Therapy (Signed)
[facility] [address], [address] [place], [place], [address] Phone: [phone]   Fax:  [phone]  Physical Therapy Treatment  Patient Details  Name: Jason Moore MRN: [id number] Date of Birth: 08/25/1970 Referring Provider (PT): Dr. Venita Lick   Encounter Date: 08/03/2020   PT End of Session - 08/03/20 1511    Visit Number 17    Date for PT Re-Evaluation 09/07/20    Authorization Type BCBS    PT Start Time 1215    PT Stop Time 1315    PT Time Calculation (min) 60 min    Activity Tolerance Patient tolerated treatment well    Behavior During Therapy [facility] for tasks assessed/performed           Past Medical History:  Diagnosis Date  . Anxiety   . Back pain   . Degeneration of lumbar intervertebral disc   . Hypertension   . Isthmic spondylolisthesis   . Joint pain   . Lumbar radiculopathy   . Numbness and tingling   . Sleep apnea    cpap @ [facility]    Past Surgical History:  Procedure Laterality Date  . ABDOMINAL EXPOSURE N/A 03/21/2020   Procedure: ABDOMINAL EXPOSURE;  Surgeon: Larina Earthly, MD;  Location: [facility] OR;  Service: Vascular;  Laterality: N/A;  . ANTERIOR LUMBAR FUSION N/A 03/21/2020   Procedure: ATTEMPTED ANTERIOR LUMBAR FUSION (ALIF) LUMBAR FIVE- SACRAL ONE, POSTERIOR SPINAL FUSION INTERBODY LUMBAR FIVE-SACRAL ONE;  Surgeon: Venita Lick, MD;  Location: [facility] OR;  Service: Orthopedics;  Laterality: N/A;  5 HRS Dr. Arbie Cookey to do approach Tap block with exparel  . SHOULDER ARTHROSCOPY  04/15/2007  . TRANSFORAMINAL LUMBAR INTERBODY FUSION (TLIF) WITH PEDICLE SCREW FIXATION 1 LEVEL N/A 05/02/2020   Procedure: TRANSFORAMINAL LUMBAR INTERBODY FUSION (TLIF) LUMBAR 5-SACRAL 1;  Surgeon: Venita Lick, MD;  Location: [facility] OR;  Service: Orthopedics;  Laterality: N/A;  4.5 hrs    There were no vitals filed for this visit.   Subjective Assessment - 08/03/20 1509    Subjective My back pain was much better after  last PT. Mostly low back pain now, all leg pain is pretty much gone.    Currently in Pain? Yes    Pain Score 5     Pain Location Back    Pain Orientation Lower    Pain Descriptors / Indicators Sore;Tiring    Multiple Pain Sites No          Treatment: Aquatics Patient seen for aquatic therapy today.  Treatment took place in water 3.5-4,5 feet deep depending upon activity.  Pt entered the pool via stairs with mild use of hand rails, more getting out. Water temp 90 degrees F.  Seated water bench with 75% submersion Pt performed seated LE AROM exercises 20x in all planes, concurrent pain assessment.  Water walking all 4 directions in same depth as listed above 6 lengths each. Pt able to generate increased current from his speed. High knee marching same length/depth as above 4x Wall exercises: all Bil 20x: squats, heel raises, hip abd/add, circumduction, flex/ext Single leg stance with small blue noodle hip & knee ext 10x Bil, requires a few fingers on the wall for balance. Underwater bicycle x 6 min with blue block noodle behind pt Standing hamstring stretches Bil at 2nd step 3x 20 sec Decompression float x 5 min with aquajogger and neck support UE yellow weights for shoulder horizontal abd/add 20x with VC to contract core.  PT Short Term Goals - 07/26/20 1731      PT SHORT TERM GOAL #1   Title The patient will demonstrate knowledge of and compliance with initial HEP    Status Achieved      PT SHORT TERM GOAL #2   Title The patient will be able to walk 8-10 min with RW needed for community mobility    Status Achieved      PT SHORT TERM GOAL #3   Title The patient will be able to rise from a standard chair with minimal use of UE assist    Status Achieved      PT SHORT TERM GOAL #4   Title Get baseline TUG and 6 min walk tests    Status Achieved             PT Long Term Goals - 07/26/20 1732      PT LONG TERM GOAL #1    Title The patient will be independent with safe self progression of HEP    Time 8    Period Weeks    Status On-going    Target Date 09/07/20      PT LONG TERM GOAL #2   Title The patient will report a 40% improvement in  left LE pain with home ADLs and return to work duties    Time 12    Period Weeks    Status On-going      PT LONG TERM GOAL #3   Title The patient will be able to ambulate household distances with single point cane    Status Achieved      PT LONG TERM GOAL #4   Title Improved gait speed indicated by TUG time to 18 sec or less and 6 min walk test to 800 feet    Time 12    Status On-going      PT LONG TERM GOAL #5   Title Bil LE strength improved to 4/5 to 4+/5 needed to ascend /descend steps at home, ease with sit to stand and to lift/carry light objects at home    Time 12    Period Weeks    Status On-going      PT LONG TERM GOAL #6   Title Modified Oswestry Score improved by at least 8 points    Time 12    Period Weeks    Status On-going                 Plan - 08/03/20 1511    Clinical Impression Statement Pt tolerated 1 hour of aquatic exercise with no complaints of pain or extra fatigue.Pt able to increase work load/duration in the pool to work towards better functional activity/work tolerance. Pt able to generate a nice current with LE at this time allowing for greater resistance,    Personal Factors and Comorbidities Comorbidity 1;Comorbidity 2;Comorbidity 3+;Time since onset of injury/illness/exacerbation    Comorbidities DDD, HTN, sleep apnea; recent left LE DVT (on blood thinner); 2 recent surgeries: 12/8 and posterior lumbar fusion 1/19 with no lift >10#, no bending, no twisting    Examination-Activity Limitations Locomotion Level;Transfers;Lift;Hygiene/Grooming;Dressing;Stairs;Stand;Squat;Carry    Examination-Participation Restrictions Meal Prep;Occupation;Community Activity;Driving;Other    Stability/Clinical Decision Making Evolving/Moderate  complexity    Rehab Potential Good    PT Frequency 2x / week    PT Treatment/Interventions ADLs/Self Care Home Management;Aquatic Therapy;Cryotherapy;Electrical Stimulation;Moist Heat;Neuromuscular re-education;Therapeutic exercise;Therapeutic activities;Gait training;Stair training;Patient/family education;Manual techniques;Taping    PT Next Visit Plan continue spinal fusion surgical rehab program; trunk and  LE strengthening progression to prepare for return to work and future discharge to [employer];   aquatics;  and TUG rechecks; Modified Oswestry recheck    PT Home Exercise Plan [phone]    Consulted and Agree with Plan of Care Patient           Patient will benefit from skilled therapeutic intervention in order to improve the following deficits and impairments:  Decreased range of motion,Difficulty walking,Decreased activity tolerance,Impaired perceived functional ability,Pain,Decreased strength,Decreased mobility,Impaired flexibility  Visit Diagnosis: Acute bilateral low back pain, unspecified whether sciatica present  Muscle weakness (generalized)  Difficulty in walking, not elsewhere classified  Pain in left leg     Problem List Patient Active Problem List   Diagnosis Date Noted  . Acute pulmonary embolism (HCC) 05/18/2020  . DVT (deep venous thrombosis) (HCC) 05/18/2020  . OSA (obstructive sleep apnea) 05/18/2020  . Fusion of lumbar spine 05/02/2020  . Degenerative disc disease at L5-S1 level 03/21/2020  . Hypertension 02/24/2020  . BPH with obstruction/lower urinary tract symptoms 05/12/2018  . Erectile dysfunction due to arterial insufficiency 05/12/2018  . Family history of prostate cancer in father 05/12/2018  . Constipation 12/14/2013  . Incomplete emptying of bladder 12/14/2013  . Organic impotence 12/14/2013  . Thrombosed external hemorrhoid 12/14/2013    Kalese Ensz, PTA 08/03/2020, 3:15 PM  [facility] [address], [address] [place], [place], [address] Phone: [phone]   Fax:  [phone]  Name: BRYLON BRENNING MRN: [id number] Date of Birth: [birth year]

## 2020-08-07 ENCOUNTER — Ambulatory Visit: Payer: BC Managed Care – PPO | Admitting: Physical Therapy

## 2020-08-07 ENCOUNTER — Other Ambulatory Visit: Payer: Self-pay

## 2020-08-07 DIAGNOSIS — M545 Low back pain, unspecified: Secondary | ICD-10-CM | POA: Diagnosis not present

## 2020-08-07 DIAGNOSIS — R262 Difficulty in walking, not elsewhere classified: Secondary | ICD-10-CM | POA: Diagnosis not present

## 2020-08-07 DIAGNOSIS — M79605 Pain in left leg: Secondary | ICD-10-CM

## 2020-08-07 DIAGNOSIS — M6281 Muscle weakness (generalized): Secondary | ICD-10-CM | POA: Diagnosis not present

## 2020-08-07 NOTE — Therapy (Signed)
[facility] [address], [address] [place], [place], [address] Phone: [phone]   Fax:  [phone]  Physical Therapy Treatment  Patient Details  Name: Jason Moore MRN: [id number] Date of Birth: 03/04/1971 Referring Provider (PT): Dr. Venita Lick   Encounter Date: 08/07/2020   PT End of Session - 08/07/20 1616    Visit Number 18    Date for PT Re-Evaluation 09/07/20    Authorization Type BCBS    PT Start Time 1531    PT Stop Time 1615    PT Time Calculation (min) 44 min    Activity Tolerance Patient tolerated treatment well           Past Medical History:  Diagnosis Date  . Anxiety   . Back pain   . Degeneration of lumbar intervertebral disc   . Hypertension   . Isthmic spondylolisthesis   . Joint pain   . Lumbar radiculopathy   . Numbness and tingling   . Sleep apnea    cpap @ [facility]    Past Surgical History:  Procedure Laterality Date  . ABDOMINAL EXPOSURE N/A 03/21/2020   Procedure: ABDOMINAL EXPOSURE;  Surgeon: Larina Earthly, MD;  Location: [facility] OR;  Service: Vascular;  Laterality: N/A;  . ANTERIOR LUMBAR FUSION N/A 03/21/2020   Procedure: ATTEMPTED ANTERIOR LUMBAR FUSION (ALIF) LUMBAR FIVE- SACRAL ONE, POSTERIOR SPINAL FUSION INTERBODY LUMBAR FIVE-SACRAL ONE;  Surgeon: Venita Lick, MD;  Location: [facility] OR;  Service: Orthopedics;  Laterality: N/A;  5 HRS Dr. Arbie Cookey to do approach Tap block with exparel  . SHOULDER ARTHROSCOPY  04/15/2007  . TRANSFORAMINAL LUMBAR INTERBODY FUSION (TLIF) WITH PEDICLE SCREW FIXATION 1 LEVEL N/A 05/02/2020   Procedure: TRANSFORAMINAL LUMBAR INTERBODY FUSION (TLIF) LUMBAR 5-SACRAL 1;  Surgeon: Venita Lick, MD;  Location: [facility] OR;  Service: Orthopedics;  Laterality: N/A;  4.5 hrs    There were no vitals filed for this visit.   Subjective Assessment - 08/07/20 1534    Subjective Back to work;  sitting is bothering my back.  My leg has been swelling some.    Pertinent History  RESTRICTIONS NO LIFT >10#, NO BEND,NO TWIST; NO LIFT OVERHEAD;  Ortho Dr. Venita Lick 06/12/20; Vascular end of Feb    Patient Stated Goals out of brace, off walker, walk for 5 minutes without pain    Currently in Pain? Yes    Pain Score 6     Pain Location Back    Pain Orientation Lower              [facility] PT Assessment - 08/07/20 0001      6 minute walk test results    Aerobic Endurance Distance Walked 950   no AD     Timed Up and Go Test   Normal TUG (seconds) 11.15   no assistive device                        [facility] Adult PT Treatment/Exercise - 08/07/20 0001      Lumbar Exercises: Aerobic   Nustep L3 x 8 min discussing status      Lumbar Exercises: Standing   Shoulder Extension Strengthening;Both;20 reps;Theraband   good/proper review   Theraband Level (Shoulder Extension) Level 3 (Green)    Shoulder Extension Limitations 2nd set with march    Other Standing Lumbar Exercises 5# snatch to shoulder no overhead 10x right/left    Other Standing Lumbar Exercises green band bil press forward 2x  10      Lumbar Exercises: Seated   Other Seated Lumbar Exercises partial dead lifts 2 5# kettlebells 10x      Knee/Hip Exercises: Standing   Forward Step Up Right;Left;1 set;10 reps;Hand Hold: 0;Step Height: 6"    Forward Step Up Limitations holding 5# kettlebell    Other Standing Knee Exercises floor slider half arcs 10x right/left                    PT Short Term Goals - 08/07/20 1628      PT SHORT TERM GOAL #1   Title The patient will demonstrate knowledge of and compliance with initial HEP    Status Achieved      PT SHORT TERM GOAL #2   Title The patient will be able to walk 8-10 min with RW needed for community mobility    Status Achieved      PT SHORT TERM GOAL #3   Title The patient will be able to rise from a standard chair with minimal use of UE assist    Status Achieved      PT SHORT TERM GOAL #4   Title Get baseline TUG and 6 min walk  tests    Status Achieved             PT Long Term Goals - 08/07/20 1628      PT LONG TERM GOAL #1   Title The patient will be independent with safe self progression of HEP    Time 8    Period Weeks    Status On-going    Target Date 09/07/20      PT LONG TERM GOAL #2   Title The patient will report a 40% improvement in  left LE pain with home ADLs and return to work duties    Time 12    Period Weeks    Status On-going      PT LONG TERM GOAL #3   Title The patient will be able to ambulate household distances with single point cane    Status Achieved      PT LONG TERM GOAL #4   Title Improved gait speed indicated by TUG time to 18 sec or less and 6 min walk test to 800 feet    Status Achieved      PT LONG TERM GOAL #5   Title Bil LE strength improved to 4/5 to 4+/5 needed to ascend /descend steps at home, ease with sit to stand and to lift/carry light objects at home    Time 12    Period Weeks    Status On-going      PT LONG TERM GOAL #6   Title Modified Oswestry Score improved by at least 8 points    Time 12    Period Weeks    Status On-going                 Plan - 08/07/20 1623    Clinical Impression Statement The patient has now returned to work which includes prolonged sitting which has caused an increase in LBP.  He is able to continue with a progression of loaded/ weight bearing ex's within surgical precautions with reports of general fatigue but no pain exacerbation.  His gait speed and stamina have improved significantly with Timed up and Go improved from 22.8 sec to 11 sec today.  6 min walk test improved to 950 feet without the need for an assistive device.  He is progressing  well with rehab goals and should be ready for discharge to [facility] in the next 2 weeks.    Comorbidities DDD, HTN, sleep apnea; recent left LE DVT (on blood thinner); 2 recent surgeries: 12/8 and posterior lumbar fusion 1/19 with no lift >10#, no bending, no twisting     Examination-Activity Limitations Locomotion Level;Transfers;Lift;Hygiene/Grooming;Dressing;Stairs;Stand;Squat;Carry    Rehab Potential Good    PT Frequency 2x / week    PT Duration 8 weeks    PT Treatment/Interventions ADLs/Self Care Home Management;Aquatic Therapy;Cryotherapy;Electrical Stimulation;Moist Heat;Neuromuscular re-education;Therapeutic exercise;Therapeutic activities;Gait training;Stair training;Patient/family education;Manual techniques;Taping    PT Next Visit Plan finalization of HEP land and aquatic based in preparation for discharge in 2 weeks.  Try some gym machines as pt may go with a friend to [employer]; Modified Oswestry recheck for LTG   PT Home Exercise Plan [phone]           Patient will benefit from skilled therapeutic intervention in order to improve the following deficits and impairments:  Decreased range of motion,Difficulty walking,Decreased activity tolerance,Impaired perceived functional ability,Pain,Decreased strength,Decreased mobility,Impaired flexibility  Visit Diagnosis: Acute bilateral low back pain, unspecified whether sciatica present  Muscle weakness (generalized)  Difficulty in walking, not elsewhere classified  Pain in left leg     Problem List Patient Active Problem List   Diagnosis Date Noted  . Acute pulmonary embolism (HCC) 05/18/2020  . DVT (deep venous thrombosis) (HCC) 05/18/2020  . OSA (obstructive sleep apnea) 05/18/2020  . Fusion of lumbar spine 05/02/2020  . Degenerative disc disease at L5-S1 level 03/21/2020  . Hypertension 02/24/2020  . BPH with obstruction/lower urinary tract symptoms 05/12/2018  . Erectile dysfunction due to arterial insufficiency 05/12/2018  . Family history of prostate cancer in father 05/12/2018  . Constipation 12/14/2013  . Incomplete emptying of bladder 12/14/2013  . Organic impotence 12/14/2013  . Thrombosed external hemorrhoid 12/14/2013   Lavinia Sharps, PT 08/07/20 4:29 PM Phone:  [phone] Fax: [phone] Vivien Presto 08/07/2020, 4:29 PM  [facility] [address], [address] [place], [place], [address] Phone: [phone]   Fax:  [phone]  Name: Jason Moore MRN: [id number] Date of Birth: 22-Apr-1970

## 2020-08-09 ENCOUNTER — Encounter: Payer: BC Managed Care – PPO | Admitting: Physical Therapy

## 2020-08-13 ENCOUNTER — Other Ambulatory Visit: Payer: Self-pay

## 2020-08-13 ENCOUNTER — Encounter: Payer: Self-pay | Admitting: Physical Therapy

## 2020-08-13 ENCOUNTER — Ambulatory Visit: Payer: BC Managed Care – PPO | Attending: Internal Medicine | Admitting: Physical Therapy

## 2020-08-13 DIAGNOSIS — M6281 Muscle weakness (generalized): Secondary | ICD-10-CM | POA: Diagnosis not present

## 2020-08-13 DIAGNOSIS — M545 Low back pain, unspecified: Secondary | ICD-10-CM | POA: Diagnosis not present

## 2020-08-13 DIAGNOSIS — R262 Difficulty in walking, not elsewhere classified: Secondary | ICD-10-CM | POA: Insufficient documentation

## 2020-08-13 DIAGNOSIS — M79605 Pain in left leg: Secondary | ICD-10-CM | POA: Diagnosis not present

## 2020-08-13 NOTE — Therapy (Signed)
[facility] [address] [place], [place], [address] Phone: [phone]   Fax:  [phone]  Physical Therapy Treatment  Patient Details  Name: Jason Moore MRN: [id number] Date of Birth: 1970/09/03 Referring Provider (PT): Dr. Venita Lick   Encounter Date: 08/13/2020   PT End of Session - 08/13/20 1532    Visit Number 19    Date for PT Re-Evaluation 09/07/20    Authorization Type BCBS    PT Start Time 1531    PT Stop Time 1611    PT Time Calculation (min) 40 min    Activity Tolerance Patient tolerated treatment well    Behavior During Therapy [facility] for tasks assessed/performed           Past Medical History:  Diagnosis Date  . Anxiety   . Back pain   . Degeneration of lumbar intervertebral disc   . Hypertension   . Isthmic spondylolisthesis   . Joint pain   . Lumbar radiculopathy   . Numbness and tingling   . Sleep apnea    cpap @ [facility]    Past Surgical History:  Procedure Laterality Date  . ABDOMINAL EXPOSURE N/A 03/21/2020   Procedure: ABDOMINAL EXPOSURE;  Surgeon: Larina Earthly, MD;  Location: [facility] OR;  Service: Vascular;  Laterality: N/A;  . ANTERIOR LUMBAR FUSION N/A 03/21/2020   Procedure: ATTEMPTED ANTERIOR LUMBAR FUSION (ALIF) LUMBAR FIVE- SACRAL ONE, POSTERIOR SPINAL FUSION INTERBODY LUMBAR FIVE-SACRAL ONE;  Surgeon: Venita Lick, MD;  Location: [facility] OR;  Service: Orthopedics;  Laterality: N/A;  5 HRS Dr. Arbie Cookey to do approach Tap block with exparel  . SHOULDER ARTHROSCOPY  04/15/2007  . TRANSFORAMINAL LUMBAR INTERBODY FUSION (TLIF) WITH PEDICLE SCREW FIXATION 1 LEVEL N/A 05/02/2020   Procedure: TRANSFORAMINAL LUMBAR INTERBODY FUSION (TLIF) LUMBAR 5-SACRAL 1;  Surgeon: Venita Lick, MD;  Location: [facility] OR;  Service: Orthopedics;  Laterality: N/A;  4.5 hrs    There were no vitals filed for this visit.   Subjective Assessment - 08/13/20 1533    Subjective Pain right now is about a 2/10.     Currently in Pain? Yes    Pain Score 2     Pain Location Back    Pain Orientation Lower    Pain Descriptors / Indicators Dull;Aching    Multiple Pain Sites No                             [facility] Adult PT Treatment/Exercise - 08/13/20 0001      Lumbar Exercises: Aerobic   Nustep L4 x 10 min with discussion of status      Lumbar Exercises: Standing   Shoulder Extension Strengthening;Both;20 reps;Theraband   good/proper review   Theraband Level (Shoulder Extension) Level 4 (Blue)    Shoulder Extension Limitations marching both sets    Other Standing Lumbar Exercises 5# snatch to shoulder no overhead 10x right/left    Other Standing Lumbar Exercises green band bil press forward 2x 10   5# KB 2x10 partial deadlifts ( small ROM )     Lumbar Exercises: Seated   Other Seated Lumbar Exercises partial dead lifts 2 5# kettlebells 10x      Knee/Hip Exercises: Standing   Forward Step Up Right;Left;1 set;10 reps;Hand Hold: 0;Step Height: 6"    Forward Step Up Limitations holding 5# kettlebell    Other Standing Knee Exercises floor slider half arcs 10x right/left with UE support   then  no UE hip add/abd Bil 10x                   PT Short Term Goals - 08/07/20 1628      PT SHORT TERM GOAL #1   Title The patient will demonstrate knowledge of and compliance with initial HEP    Status Achieved      PT SHORT TERM GOAL #2   Title The patient will be able to walk 8-10 min with RW needed for community mobility    Status Achieved      PT SHORT TERM GOAL #3   Title The patient will be able to rise from a standard chair with minimal use of UE assist    Status Achieved      PT SHORT TERM GOAL #4   Title Get baseline TUG and 6 min walk tests    Status Achieved             PT Long Term Goals - 08/07/20 1628      PT LONG TERM GOAL #1   Title The patient will be independent with safe self progression of HEP    Time 8    Period Weeks    Status On-going    Target  Date 09/07/20      PT LONG TERM GOAL #2   Title The patient will report a 40% improvement in  left LE pain with home ADLs and return to work duties    Time 12    Period Weeks    Status On-going      PT LONG TERM GOAL #3   Title The patient will be able to ambulate household distances with single point cane    Status Achieved      PT LONG TERM GOAL #4   Title Improved gait speed indicated by TUG time to 18 sec or less and 6 min walk test to 800 feet    Status Achieved      PT LONG TERM GOAL #5   Title Bil LE strength improved to 4/5 to 4+/5 needed to ascend /descend steps at home, ease with sit to stand and to lift/carry light objects at home    Time 12    Period Weeks    Status On-going      PT LONG TERM GOAL #6   Title Modified Oswestry Score improved by at least 8 points    Time 12    Period Weeks    Status On-going                 Plan - 08/13/20 1543    Clinical Impression Statement Pt is planning on joining [employer] with co-worker to continue with his rehab and fitness. Pt reports low level of pain today and continues to tolerate higher levels of exercises. Pt states he may want this Friday to be his last in the pool and then DC to the gym. he will decided on Friday.    Personal Factors and Comorbidities Comorbidity 1;Comorbidity 2;Comorbidity 3+;Time since onset of injury/illness/exacerbation    Comorbidities DDD, HTN, sleep apnea; recent left LE DVT (on blood thinner); 2 recent surgeries: 12/8 and posterior lumbar fusion 1/19 with no lift >10#, no bending, no twisting    Examination-Activity Limitations Locomotion Level;Transfers;Lift;Hygiene/Grooming;Dressing;Stairs;Stand;Squat;Carry    Examination-Participation Restrictions Meal Prep;Occupation;Community Activity;Driving;Other    Stability/Clinical Decision Making Evolving/Moderate complexity    Rehab Potential Good    PT Frequency 2x / week    PT Duration 8  weeks    PT Treatment/Interventions ADLs/Self  Care Home Management;Aquatic Therapy;Cryotherapy;Electrical Stimulation;Moist Heat;Neuromuscular re-education;Therapeutic exercise;Therapeutic activities;Gait training;Stair training;Patient/family education;Manual techniques;Taping    PT Next Visit Plan Pt may want to DC this Friday since there is no aquatics next Friday.    PT Home Exercise Plan [phone]    Consulted and Agree with Plan of Care Patient           Patient will benefit from skilled therapeutic intervention in order to improve the following deficits and impairments:  Decreased range of motion,Difficulty walking,Decreased activity tolerance,Impaired perceived functional ability,Pain,Decreased strength,Decreased mobility,Impaired flexibility  Visit Diagnosis: Acute bilateral low back pain, unspecified whether sciatica present  Muscle weakness (generalized)  Difficulty in walking, not elsewhere classified  Pain in left leg     Problem List Patient Active Problem List   Diagnosis Date Noted  . Acute pulmonary embolism (HCC) 05/18/2020  . DVT (deep venous thrombosis) (HCC) 05/18/2020  . OSA (obstructive sleep apnea) 05/18/2020  . Fusion of lumbar spine 05/02/2020  . Degenerative disc disease at L5-S1 level 03/21/2020  . Hypertension 02/24/2020  . BPH with obstruction/lower urinary tract symptoms 05/12/2018  . Erectile dysfunction due to arterial insufficiency 05/12/2018  . Family history of prostate cancer in father 05/12/2018  . Constipation 12/14/2013  . Incomplete emptying of bladder 12/14/2013  . Organic impotence 12/14/2013  . Thrombosed external hemorrhoid 12/14/2013    Tavian Callander, PTA 08/13/2020, 4:12 PM  [facility] [address], [address] [place], [place], [address] Phone: [phone]   Fax:  [phone]  Name: CASTLE LAMONS MRN: [id number] Date of Birth: 1970/05/20

## 2020-08-17 ENCOUNTER — Encounter: Payer: Self-pay | Admitting: Physical Therapy

## 2020-08-17 ENCOUNTER — Ambulatory Visit: Payer: BC Managed Care – PPO | Admitting: Physical Therapy

## 2020-08-17 ENCOUNTER — Other Ambulatory Visit: Payer: Self-pay

## 2020-08-17 DIAGNOSIS — M545 Low back pain, unspecified: Secondary | ICD-10-CM | POA: Diagnosis not present

## 2020-08-17 DIAGNOSIS — M79605 Pain in left leg: Secondary | ICD-10-CM

## 2020-08-17 DIAGNOSIS — M6281 Muscle weakness (generalized): Secondary | ICD-10-CM | POA: Diagnosis not present

## 2020-08-17 DIAGNOSIS — R262 Difficulty in walking, not elsewhere classified: Secondary | ICD-10-CM | POA: Diagnosis not present

## 2020-08-17 NOTE — Therapy (Addendum)
[facility] [address], [place], [place], [address] Phone: [phone]   Fax:  [phone]  Physical Therapy Treatment/Discharge Summary   Patient Details  Name: Jason Moore MRN: [id number] Date of Birth: [birth year] Referring Provider (PT): Dr. Melina Schools   Encounter Date: 08/17/2020    Past Medical History:  Diagnosis Date  . Anxiety   . Back pain   . Degeneration of lumbar intervertebral disc   . Hypertension   . Isthmic spondylolisthesis   . Joint pain   . Lumbar radiculopathy   . Numbness and tingling   . Sleep apnea    cpap @ [facility]    Past Surgical History:  Procedure Laterality Date  . ABDOMINAL EXPOSURE N/A 03/21/2020   Procedure: ABDOMINAL EXPOSURE;  Surgeon: Rosetta Posner, MD;  Location: [facility] OR;  Service: Vascular;  Laterality: N/A;  . ANTERIOR LUMBAR FUSION N/A 03/21/2020   Procedure: ATTEMPTED ANTERIOR LUMBAR FUSION (ALIF) LUMBAR FIVE- SACRAL ONE, POSTERIOR SPINAL FUSION INTERBODY LUMBAR FIVE-SACRAL ONE;  Surgeon: Melina Schools, MD;  Location: [place];  Service: Orthopedics;  Laterality: N/A;  5 HRS Dr. Donnetta Hutching to do approach Tap block with exparel  . SHOULDER ARTHROSCOPY  04/15/2007  . TRANSFORAMINAL LUMBAR INTERBODY FUSION (TLIF) WITH PEDICLE SCREW FIXATION 1 LEVEL N/A 05/02/2020   Procedure: TRANSFORAMINAL LUMBAR INTERBODY FUSION (TLIF) LUMBAR 5-SACRAL 1;  Surgeon: Melina Schools, MD;  Location: [place];  Service: Orthopedics;  Laterality: N/A;  4.5 hrs    There were no vitals filed for this visit.  Treatment: Aquatics Patient seen for aquatic therapy today.  Treatment took place in water 3.5-4.5 feet deep depending upon activity.  Pt entered the pool via steps with heavy use of hand rails exiting pool.  Water walking: 4 lengths from spotlight to spotlight in each direction. High knee marching same length 4x. Wall exercises in mid thoracic depth: some ex holding on, some no UE: marching, hip abd,  add, flex, ext, circumduction 20x Bil, mini squats 20x, calf raises 20x fast speed, hip ER stretches Bil 20 sec 3x, Bil quad stretch 1x Bil, gastroc stretching bil 20 sec 3x   Hamstring stretching at second step Bil 20 sec 3x Decompression float with 3 floatation devices x8 min     [facility] PT Assessment - 08/31/20 0001      Observation/Other Assessments   Modified Oswestry 15%                                   PT Short Term Goals - 08/07/20 1628      PT SHORT TERM GOAL #1   Title The patient will demonstrate knowledge of and compliance with initial HEP    Status Achieved      PT SHORT TERM GOAL #2   Title The patient will be able to walk 8-10 min with RW needed for community mobility    Status Achieved      PT SHORT TERM GOAL #3   Title The patient will be able to rise from a standard chair with minimal use of UE assist    Status Achieved      PT SHORT TERM GOAL #4   Title Get baseline TUG and 6 min walk tests    Status Achieved             PT Long Term Goals - 08/17/20 1601      PT LONG TERM  GOAL #1   Title The patient will be independent with safe self progression of HEP    Time 8    Period Weeks    Status Achieved      PT LONG TERM GOAL #2   Title The patient will report a 40% improvement in  left LE pain with home ADLs and return to work duties    Time 12    Period Weeks    Status Achieved   75%     PT LONG TERM GOAL #3   Title The patient will be able to ambulate household distances with single point cane    Time 12    Status Achieved      PT LONG TERM GOAL #4   Title Improved gait speed indicated by TUG time to 18 sec or less and 6 min walk test to 800 feet    Time 12    Period Weeks    Status Achieved      PT LONG TERM GOAL #5   Title Bil LE strength improved to 4/5 to 4+/5 needed to ascend /descend steps at home, ease with sit to stand and to lift/carry light objects at home    Time 12    Period Weeks    Status Achieved       PT LONG TERM GOAL #6   Title Modified Oswestry Score improved by at least 8 points    Period Weeks    Status Achieved             PHYSICAL THERAPY DISCHARGE SUMMARY  Visits from Start of Care: 20  Current functional level related to goals / functional outcomes: See clinical impressions above.  Majority of rehab goals met.   Remaining deficits: As above   Education / Equipment: HEP Plan: Patient agrees to discharge.  Patient goals were met. Patient is being discharged due to meeting the stated rehab goals.  ?????          Patient will benefit from skilled therapeutic intervention in order to improve the following deficits and impairments:  Decreased range of motion,Difficulty walking,Decreased activity tolerance,Impaired perceived functional ability,Pain,Decreased strength,Decreased mobility,Impaired flexibility  Visit Diagnosis: Acute bilateral low back pain, unspecified whether sciatica present  Muscle weakness (generalized)  Difficulty in walking, not elsewhere classified  Pain in left leg     Problem List Patient Active Problem List   Diagnosis Date Noted  . Acute pulmonary embolism ([place]) 05/18/2020  . DVT (deep venous thrombosis) ([place]) 05/18/2020  . OSA (obstructive sleep apnea) 05/18/2020  . Fusion of lumbar spine 05/02/2020  . Degenerative disc disease at L5-S1 level 03/21/2020  . Hypertension 02/24/2020  . BPH with obstruction/lower urinary tract symptoms 05/12/2018  . Erectile dysfunction due to arterial insufficiency 05/12/2018  . Family history of prostate cancer in father 05/12/2018  . Constipation 12/14/2013  . Incomplete emptying of bladder 12/14/2013  . Organic impotence 12/14/2013  . Thrombosed external hemorrhoid 12/14/2013    Ruben Im, PT 08/31/20 10:14 AM Phone: [phone] Fax: [phone] Myrene Galas, PTA 08/31/20 9:39 AM  [facility] [address]  [place], [place], [address] Phone: [phone]   Fax:  [phone]  Name: Jason Moore MRN: [id number] Date of Birth: 04/22/1970

## 2020-08-20 ENCOUNTER — Encounter: Payer: BC Managed Care – PPO | Admitting: Physical Therapy

## 2020-08-28 ENCOUNTER — Encounter: Payer: BC Managed Care – PPO | Admitting: Physical Therapy

## 2020-08-31 ENCOUNTER — Ambulatory Visit: Payer: BC Managed Care – PPO | Admitting: Physical Therapy

## 2020-09-07 ENCOUNTER — Ambulatory Visit: Payer: BC Managed Care – PPO | Admitting: Physical Therapy

## 2020-09-11 DIAGNOSIS — U071 COVID-19: Secondary | ICD-10-CM | POA: Diagnosis not present

## 2020-09-14 ENCOUNTER — Ambulatory Visit: Payer: BC Managed Care – PPO | Admitting: Physical Therapy

## 2020-09-17 ENCOUNTER — Other Ambulatory Visit: Payer: Self-pay

## 2020-09-17 ENCOUNTER — Ambulatory Visit (INDEPENDENT_AMBULATORY_CARE_PROVIDER_SITE_OTHER): Payer: BC Managed Care – PPO

## 2020-09-17 ENCOUNTER — Encounter: Payer: Self-pay | Admitting: Vascular Surgery

## 2020-09-17 ENCOUNTER — Ambulatory Visit (INDEPENDENT_AMBULATORY_CARE_PROVIDER_SITE_OTHER): Payer: BC Managed Care – PPO | Admitting: Vascular Surgery

## 2020-09-17 ENCOUNTER — Ambulatory Visit: Payer: BC Managed Care – PPO | Admitting: Vascular Surgery

## 2020-09-17 VITALS — BP 150/93 | HR 62 | Temp 98.2°F | Resp 16 | Ht 71.5 in | Wt 263.0 lb

## 2020-09-17 DIAGNOSIS — I82412 Acute embolism and thrombosis of left femoral vein: Secondary | ICD-10-CM

## 2020-09-17 DIAGNOSIS — M5137 Other intervertebral disc degeneration, lumbosacral region: Secondary | ICD-10-CM

## 2020-09-17 NOTE — Progress Notes (Signed)
[facility] [place]  Patient name: Jason Moore MRN: [id number] DOB: 1970/10/11 Sex: male  REASON FOR VISIT: Follow-up extensive left leg DVT  HPI: Jason Moore is a 50 y.o. male here today for follow-up.  He has a complicated past history.  He underwent anterior exposure for planned interbody fusion with Dr. Shon Baton on 03/21/2020.  He suffered an intraoperative complication with major venous injury.  After control of venous injury and repair, it was felt unsafe to proceed with anterior fusion and the procedure was aborted.  Subsequently was returned to the operating room by Dr. Shon Baton on 05/02/2020 for transforaminal fusion.  He did well with this.  He presented to the hospital on 05/18/2020 with extensive swelling in his left leg.  He underwent venous duplex showing extensive DVT involving his left external iliac, common femoral vein, profundus femoral vein and femoral vein.  Also had involvement of the popliteal and tibial vessels.  He was not felt to be a candidate for mechanical thrombectomy due to his recent extensive venous repair.  He was placed on anticoagulation and elevation and thigh-high compression.  He has been extremely compliant with this.  He looks quite good today.  He has no appreciable swelling on his left leg versus his right.  He does report at the end of the day he can notice some swelling in his left leg.  He does report some restlessness in his left leg as well occasionally.  He has been very compliant with thigh-high graduated compression on his left leg and also elevation at night.  Current Outpatient Medications  Medication Sig Dispense Refill  . apixaban (ELIQUIS) 5 MG TABS tablet TAKE 1 TABLET (5 MG TOTAL) BY MOUTH TWO TIMES DAILY. 60 tablet 0  . escitalopram (LEXAPRO) 5 MG tablet Take 5 mg by mouth daily.    . nebivolol (BYSTOLIC) 5 MG tablet Take 5 mg by mouth daily.     No current facility-administered  medications for this visit.     PHYSICAL EXAM: Vitals:   09/17/20 1440  BP: (!) 150/93  Pulse: 62  Resp: 16  Temp: 98.2 F (36.8 C)  TempSrc: Other (Comment)  SpO2: 95%  Weight: 263 lb (119.3 kg)  Height: 5' 11.5" (1.816 m)    GENERAL: The patient is a well-nourished male, in no acute distress. The vital signs are documented above. Minimal swelling left leg versus right.  No pitting edema.  No changes of venous stasis disease.  Specifically does not have any hemosiderin deposits.  Duplex today shows patency of his femoral vein with resolution of all clot from the common femoral vein distally.  IVC is patent.  The iliac vein is somewhat difficult to visualize.  MEDICAL ISSUES: Excellent result with clearance of his DVT in his left leg.  He is currently on Eliquis and will continue this for a total of 6 months and then discontinue this in August.  He does not need any additional duplex studies unless he has new symptoms.  He will continue wearing his compression garment.  He has been extremely compliant and has had no difficulty with thigh-high compression.  I did explain that I felt comfortable with him dropping back to knee-high if he is having any difficulty with thigh-high.  He is pleased with his result and will see [place] again on an as-needed basis   Larina Earthly, MD FACS [facility] Tel [phone]  Note: Portions of this report  may have been transcribed using voice recognition software.  Every effort has been made to ensure accuracy; however, inadvertent computerized transcription errors may still be present.

## 2020-09-21 ENCOUNTER — Ambulatory Visit: Payer: BC Managed Care – PPO | Admitting: Physical Therapy

## 2020-09-24 DIAGNOSIS — Z4889 Encounter for other specified surgical aftercare: Secondary | ICD-10-CM | POA: Diagnosis not present

## 2020-11-26 DIAGNOSIS — I2699 Other pulmonary embolism without acute cor pulmonale: Secondary | ICD-10-CM | POA: Diagnosis not present

## 2020-11-26 DIAGNOSIS — Z0000 Encounter for general adult medical examination without abnormal findings: Secondary | ICD-10-CM | POA: Diagnosis not present

## 2020-11-26 DIAGNOSIS — F411 Generalized anxiety disorder: Secondary | ICD-10-CM | POA: Diagnosis not present

## 2020-11-26 DIAGNOSIS — Z13220 Encounter for screening for lipoid disorders: Secondary | ICD-10-CM | POA: Diagnosis not present

## 2020-11-26 DIAGNOSIS — L918 Other hypertrophic disorders of the skin: Secondary | ICD-10-CM | POA: Diagnosis not present

## 2020-11-26 DIAGNOSIS — Z125 Encounter for screening for malignant neoplasm of prostate: Secondary | ICD-10-CM | POA: Diagnosis not present

## 2020-11-26 DIAGNOSIS — I10 Essential (primary) hypertension: Secondary | ICD-10-CM | POA: Diagnosis not present

## 2021-01-08 ENCOUNTER — Other Ambulatory Visit (HOSPITAL_COMMUNITY): Payer: Self-pay

## 2021-01-29 DIAGNOSIS — L918 Other hypertrophic disorders of the skin: Secondary | ICD-10-CM | POA: Diagnosis not present

## 2021-02-01 DIAGNOSIS — R351 Nocturia: Secondary | ICD-10-CM | POA: Diagnosis not present

## 2021-02-01 DIAGNOSIS — N401 Enlarged prostate with lower urinary tract symptoms: Secondary | ICD-10-CM | POA: Diagnosis not present

## 2021-02-01 DIAGNOSIS — N5201 Erectile dysfunction due to arterial insufficiency: Secondary | ICD-10-CM | POA: Diagnosis not present

## 2021-02-01 DIAGNOSIS — Z125 Encounter for screening for malignant neoplasm of prostate: Secondary | ICD-10-CM | POA: Diagnosis not present

## 2021-03-01 DIAGNOSIS — Z4889 Encounter for other specified surgical aftercare: Secondary | ICD-10-CM | POA: Diagnosis not present

## 2021-05-03 DIAGNOSIS — Z4889 Encounter for other specified surgical aftercare: Secondary | ICD-10-CM | POA: Diagnosis not present

## 2021-05-27 DIAGNOSIS — I10 Essential (primary) hypertension: Secondary | ICD-10-CM | POA: Diagnosis not present

## 2021-05-27 DIAGNOSIS — J069 Acute upper respiratory infection, unspecified: Secondary | ICD-10-CM | POA: Diagnosis not present

## 2021-05-27 DIAGNOSIS — F411 Generalized anxiety disorder: Secondary | ICD-10-CM | POA: Diagnosis not present

## 2021-06-26 DIAGNOSIS — Z03818 Encounter for observation for suspected exposure to other biological agents ruled out: Secondary | ICD-10-CM | POA: Diagnosis not present

## 2021-06-26 DIAGNOSIS — R509 Fever, unspecified: Secondary | ICD-10-CM | POA: Diagnosis not present

## 2021-06-26 DIAGNOSIS — R197 Diarrhea, unspecified: Secondary | ICD-10-CM | POA: Diagnosis not present

## 2021-06-26 DIAGNOSIS — R5381 Other malaise: Secondary | ICD-10-CM | POA: Diagnosis not present

## 2021-07-19 DIAGNOSIS — Z03818 Encounter for observation for suspected exposure to other biological agents ruled out: Secondary | ICD-10-CM | POA: Diagnosis not present

## 2021-07-19 DIAGNOSIS — J019 Acute sinusitis, unspecified: Secondary | ICD-10-CM | POA: Diagnosis not present

## 2021-08-02 DIAGNOSIS — J019 Acute sinusitis, unspecified: Secondary | ICD-10-CM | POA: Diagnosis not present

## 2021-08-02 DIAGNOSIS — J209 Acute bronchitis, unspecified: Secondary | ICD-10-CM | POA: Diagnosis not present

## 2021-09-13 DIAGNOSIS — Z1211 Encounter for screening for malignant neoplasm of colon: Secondary | ICD-10-CM | POA: Diagnosis not present

## 2021-09-13 DIAGNOSIS — D125 Benign neoplasm of sigmoid colon: Secondary | ICD-10-CM | POA: Diagnosis not present

## 2021-09-13 DIAGNOSIS — K648 Other hemorrhoids: Secondary | ICD-10-CM | POA: Diagnosis not present

## 2021-11-14 IMAGING — CR DG CHEST 2V
2 series · 2 of 2 positions shown · non-contrast
Comparison: None.

CLINICAL DATA: Preop.

EXAM:
CHEST - 2 VIEW

[w chest pa]
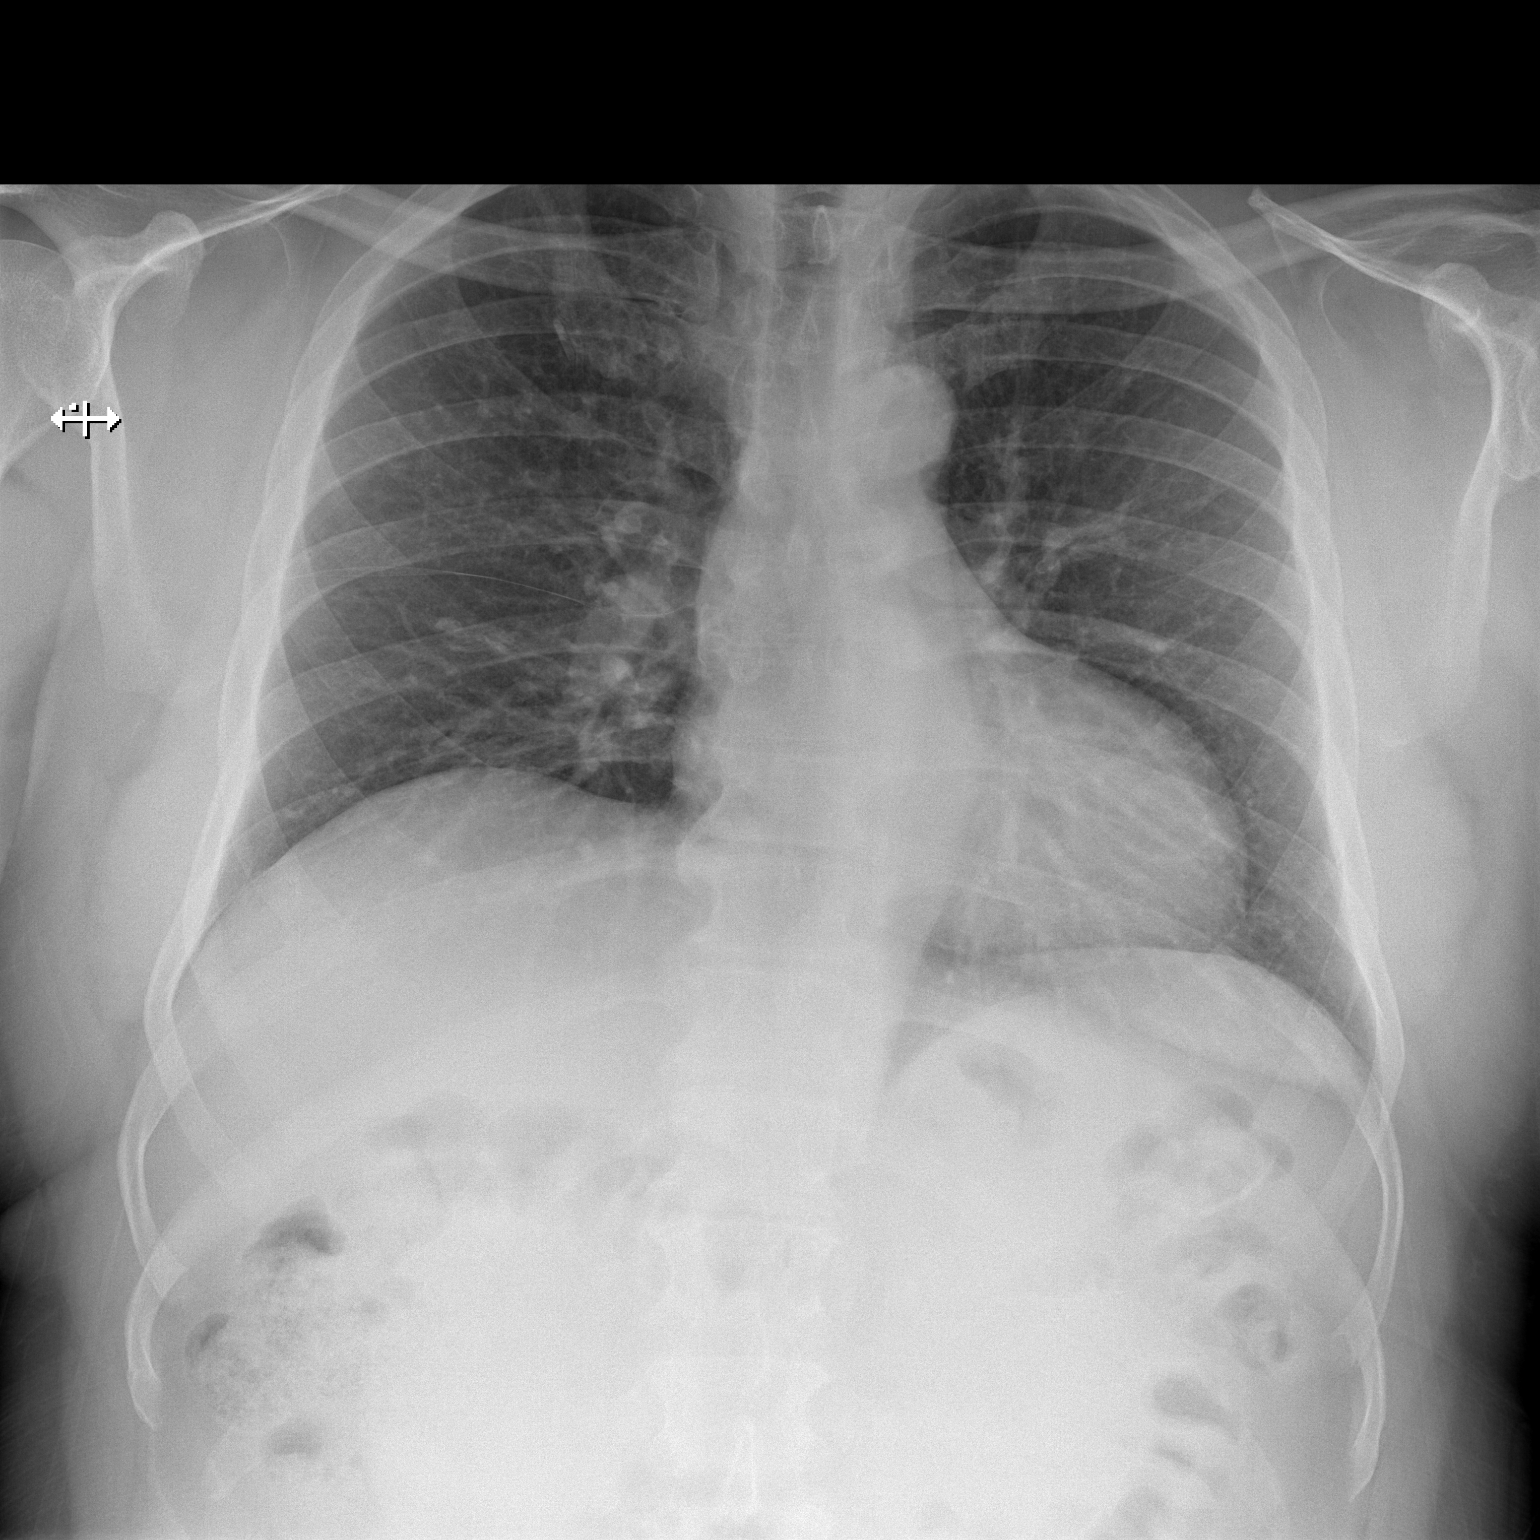

[w chest lat]
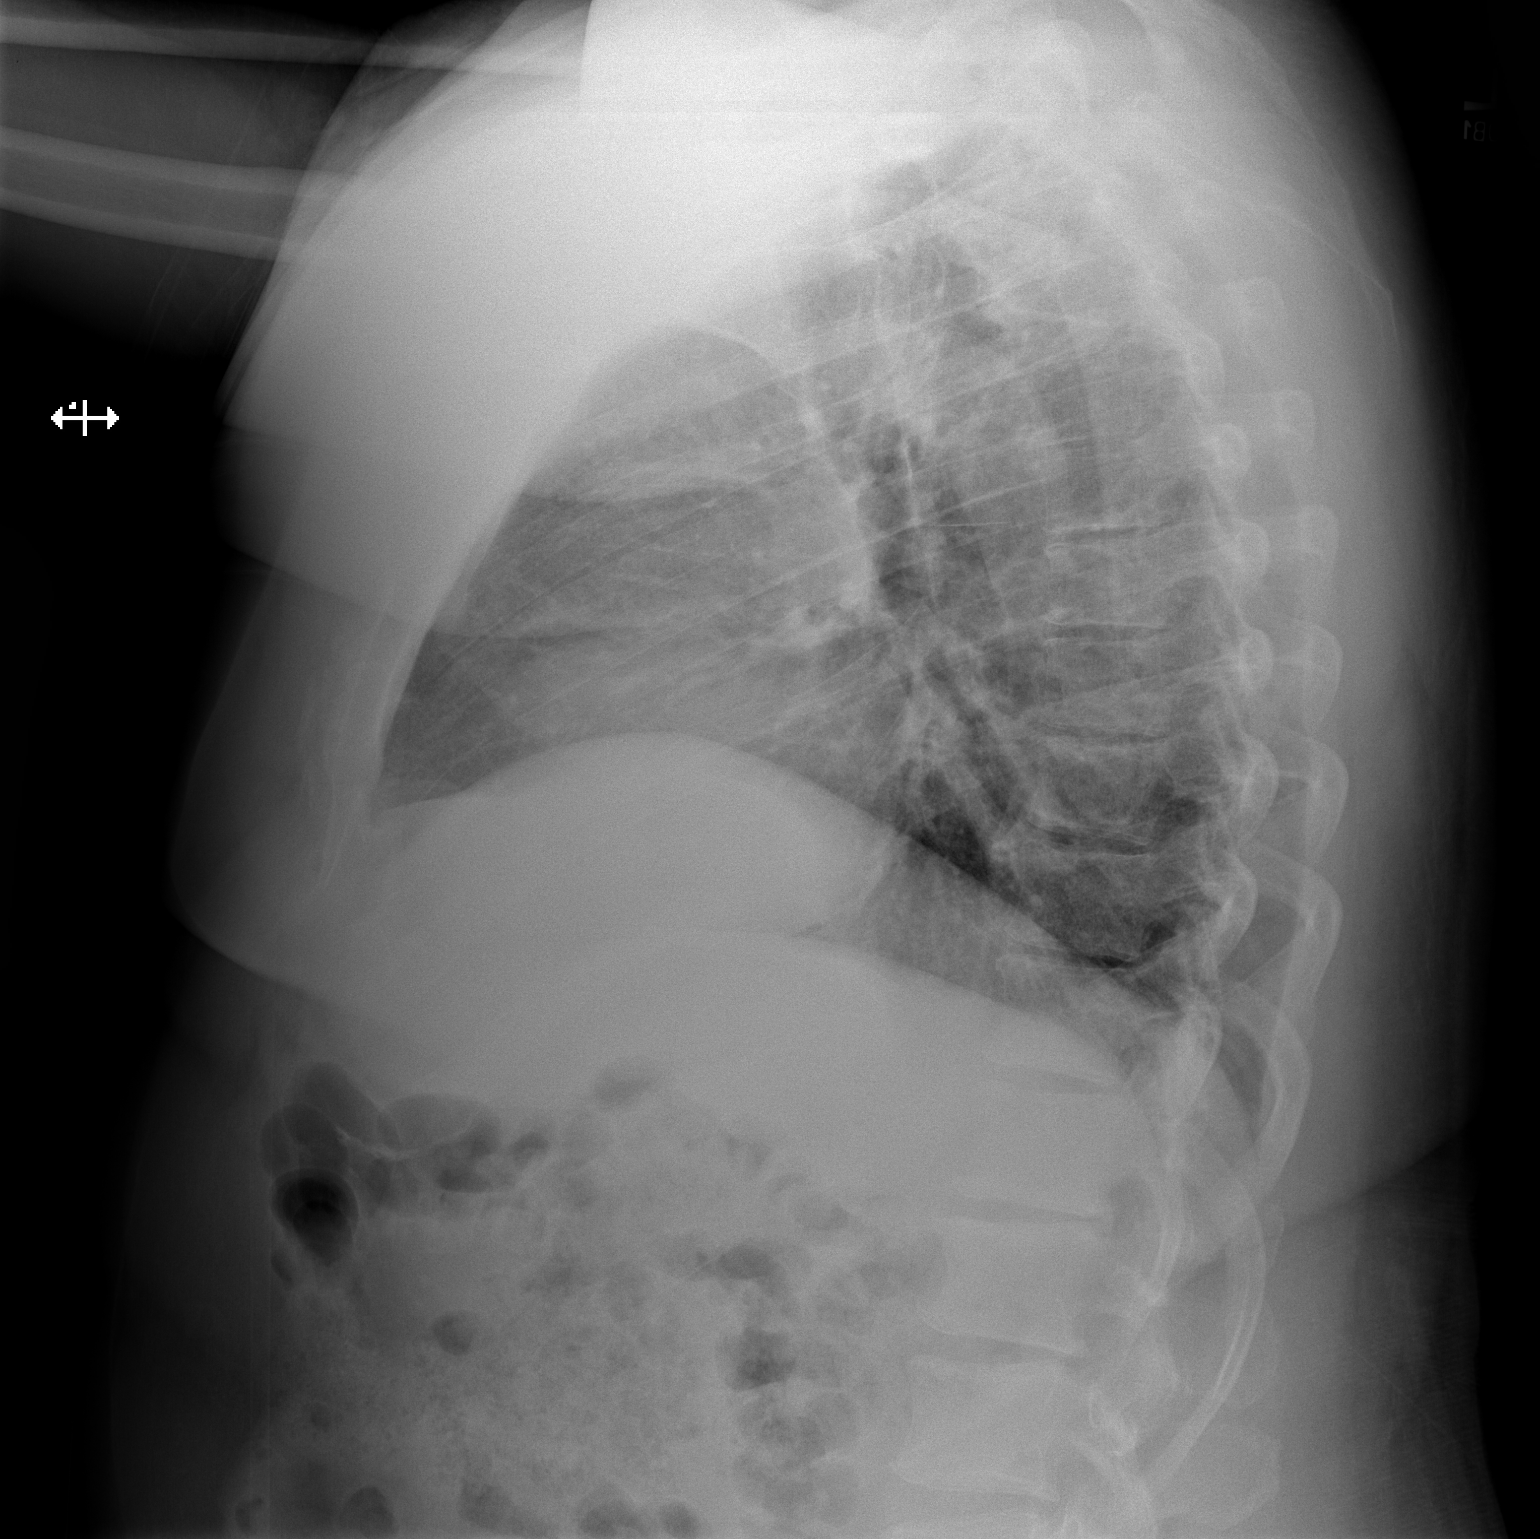

[2 of 2 positions shown; findings below may reference images not displayed]

FINDINGS: The heart size and mediastinal contours are within normal limits.
Both lungs are clear. The visualized skeletal structures are
unremarkable.
IMPRESSION: No active cardiopulmonary disease.

## 2021-11-27 ENCOUNTER — Other Ambulatory Visit (HOSPITAL_COMMUNITY): Payer: Self-pay

## 2021-12-09 DIAGNOSIS — R600 Localized edema: Secondary | ICD-10-CM | POA: Diagnosis not present

## 2021-12-09 DIAGNOSIS — I10 Essential (primary) hypertension: Secondary | ICD-10-CM | POA: Diagnosis not present

## 2021-12-09 DIAGNOSIS — Z13220 Encounter for screening for lipoid disorders: Secondary | ICD-10-CM | POA: Diagnosis not present

## 2021-12-09 DIAGNOSIS — Z0000 Encounter for general adult medical examination without abnormal findings: Secondary | ICD-10-CM | POA: Diagnosis not present

## 2021-12-09 DIAGNOSIS — F411 Generalized anxiety disorder: Secondary | ICD-10-CM | POA: Diagnosis not present

## 2021-12-09 DIAGNOSIS — Z0184 Encounter for antibody response examination: Secondary | ICD-10-CM | POA: Diagnosis not present

## 2021-12-28 IMAGING — RF DG C-ARM 1-60 MIN
1 series · 2 of 2 positions shown · non-contrast
Comparison: 02/08/2009 lumbar spine radiographs

CLINICAL DATA: L5-S1 TLIF

EXAM:
DG C-ARM 1-60 MIN; LUMBAR SPINE - 2-3 VIEW

[Series 1: run · 2 of 2 slices shown]
[im 1/2]
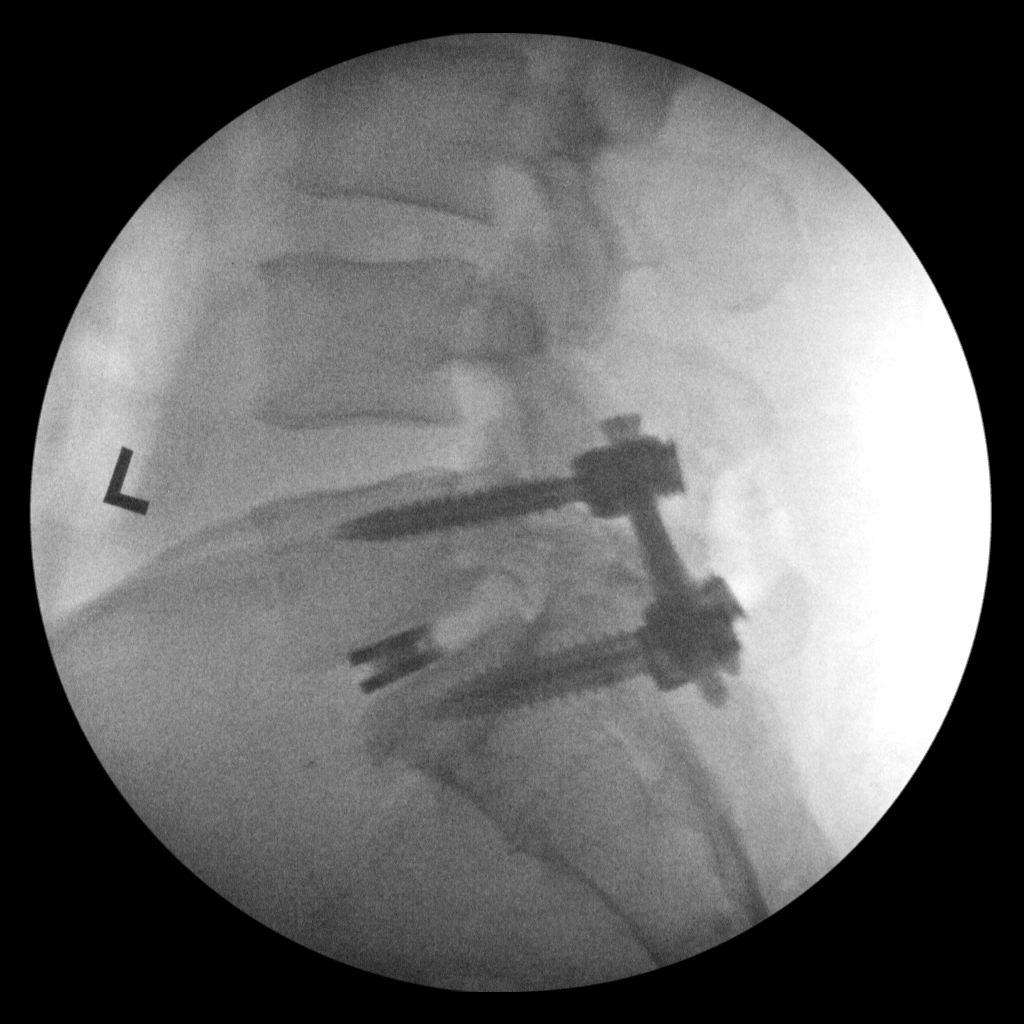
[im 2/2]
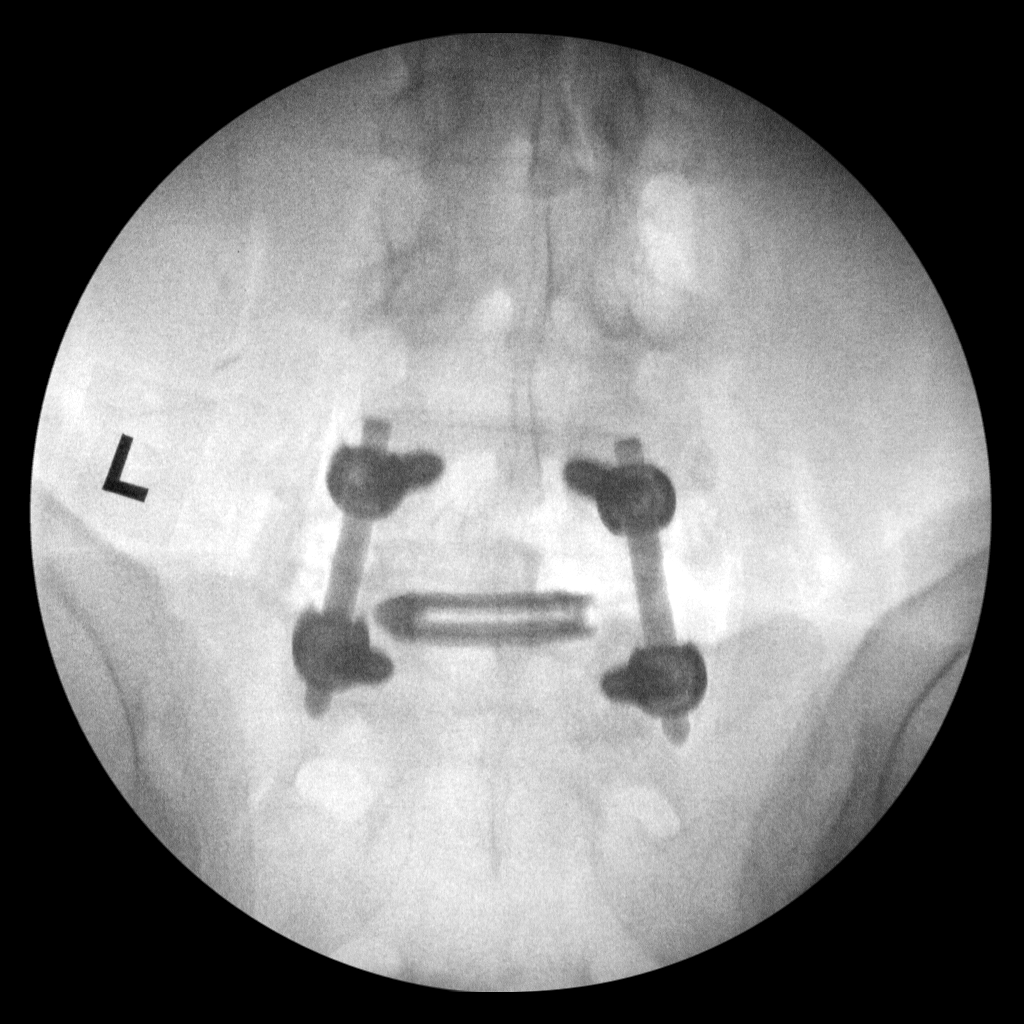

[2 of 2 positions shown; findings below may reference images not displayed]

FLUOROSCOPY TIME:  Fluoroscopy Time:  2 minutes 47 seconds

Radiation Exposure Index (if provided by the fluoroscopic device):
Sub 144 mGy

Number of Acquired Spot Images: 2
FINDINGS: Nondiagnostic spot fluoroscopic intraoperative lateral and AP views
of the lumbosacral junction demonstrate bilateral pedicle screws at
L5 and S1 with interbody spacer in the L5-S1 disc space.
IMPRESSION: Intraoperative fluoroscopic guidance for L5-S1 fusion.

## 2022-01-03 DIAGNOSIS — R351 Nocturia: Secondary | ICD-10-CM | POA: Diagnosis not present

## 2022-01-03 DIAGNOSIS — Z3009 Encounter for other general counseling and advice on contraception: Secondary | ICD-10-CM | POA: Diagnosis not present

## 2022-01-03 DIAGNOSIS — N401 Enlarged prostate with lower urinary tract symptoms: Secondary | ICD-10-CM | POA: Diagnosis not present

## 2022-01-03 DIAGNOSIS — N5201 Erectile dysfunction due to arterial insufficiency: Secondary | ICD-10-CM | POA: Diagnosis not present

## 2022-01-18 IMAGING — DX DG LUMBAR SPINE 2-3V
3 series · 3 of 3 positions shown · non-contrast
Comparison: 05/02/2020

CLINICAL DATA: Increasing low back pain radiating into the left hip

EXAM:
LUMBAR SPINE - 3 VIEW

[t lumbar spine lat]
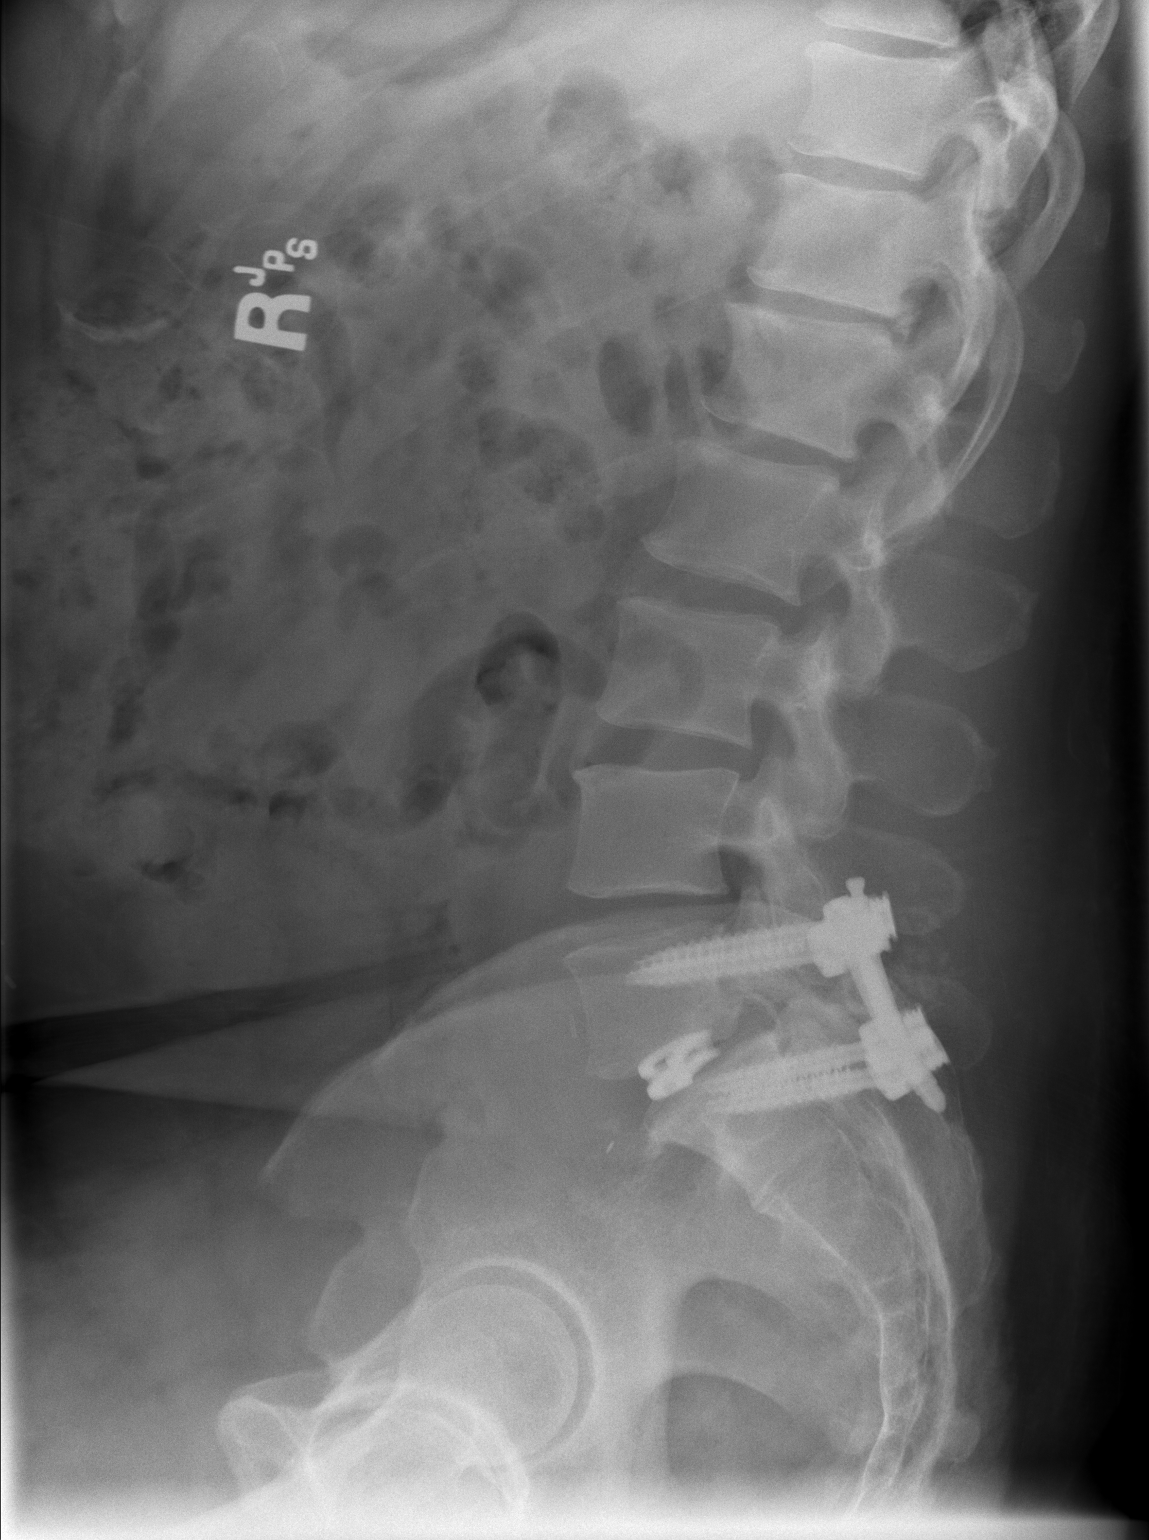

[t lumbar l-5 s-1 spot]
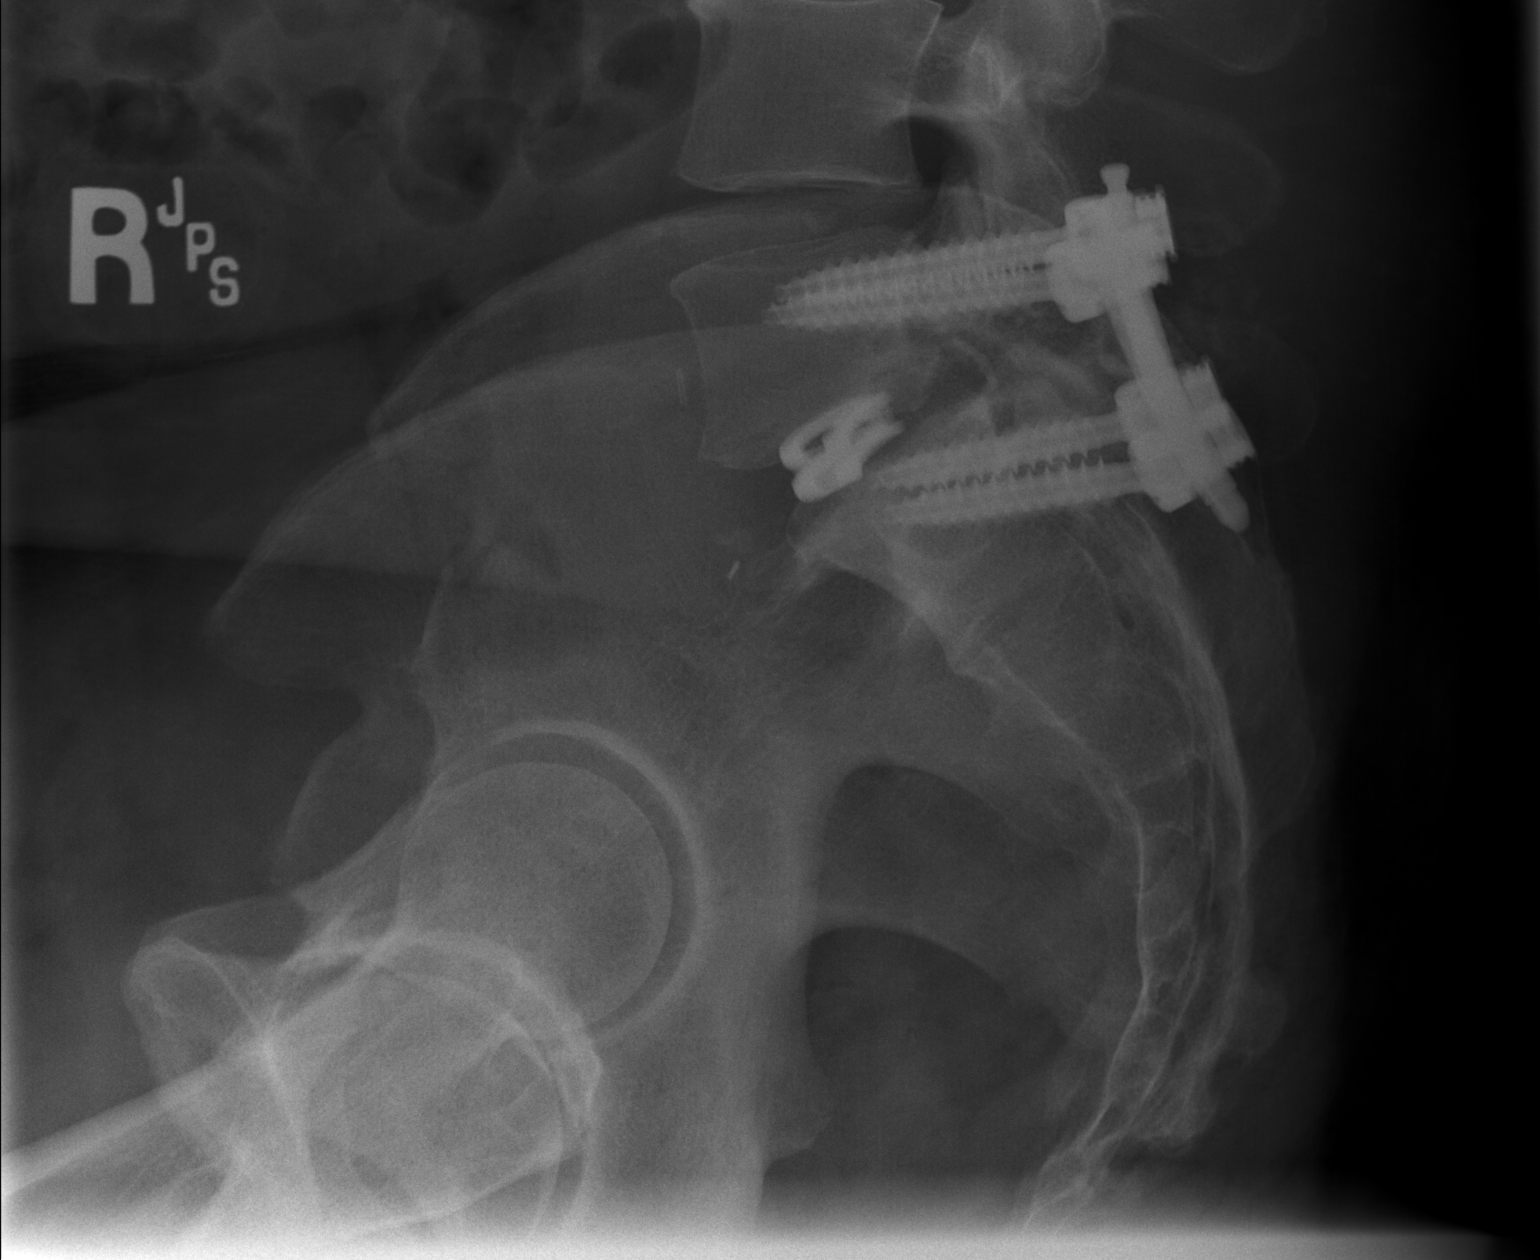

[t lumbar spine ap]
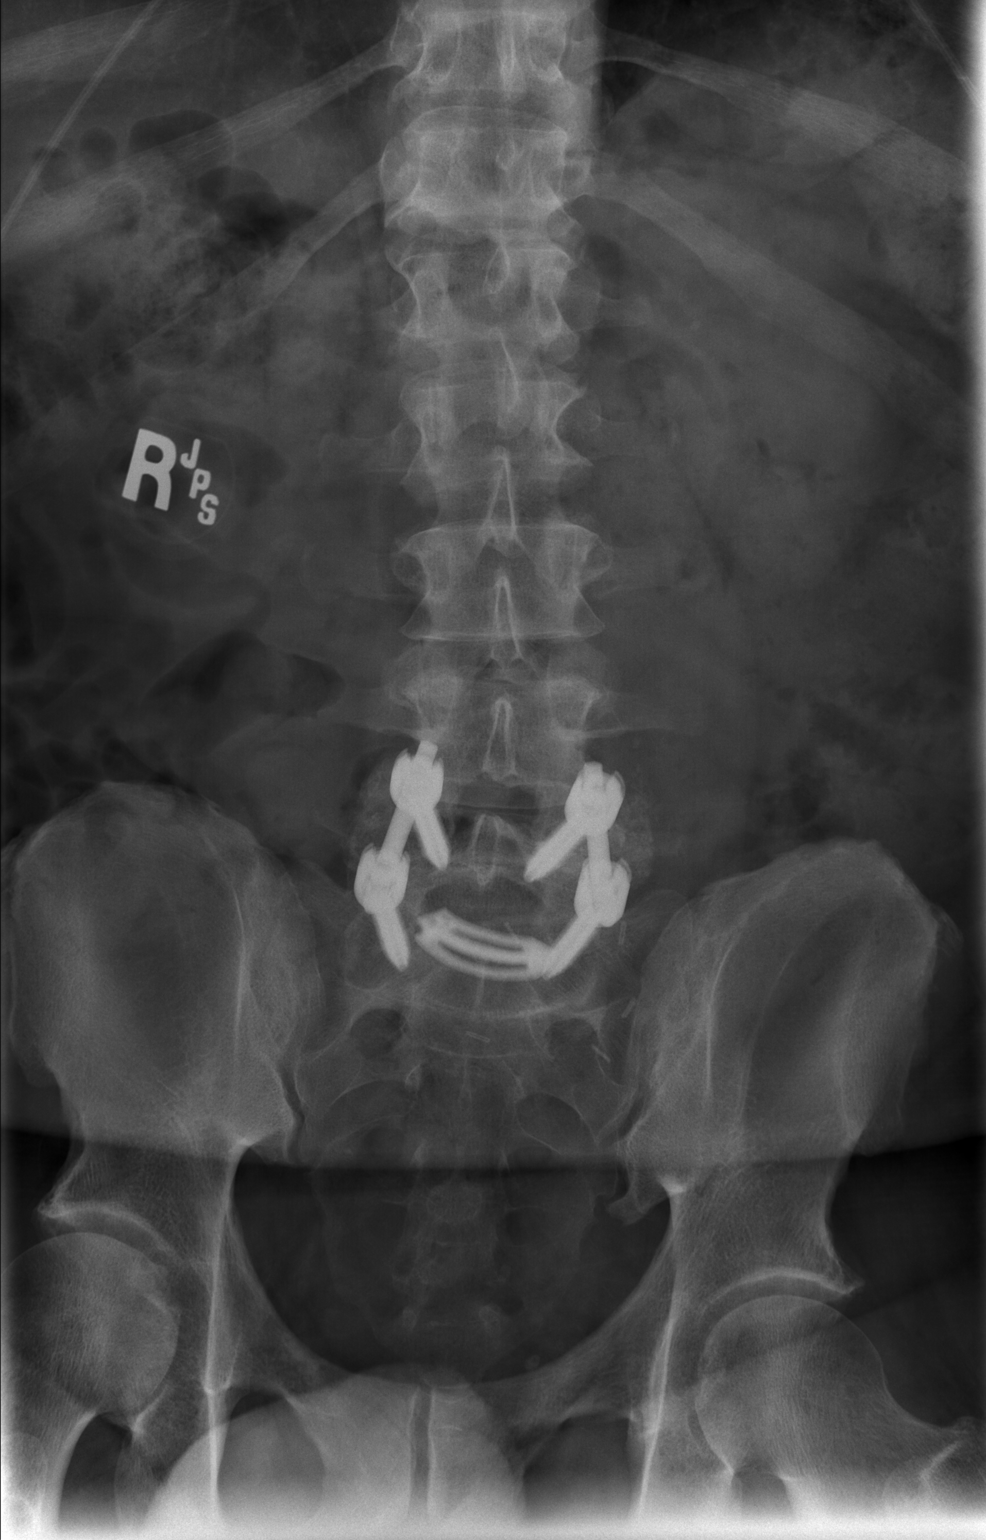

[3 of 3 positions shown; findings below may reference images not displayed]

FINDINGS: Postsurgical changes are seen at L5-S1 with pedicle screw placement
and interbody fusion. The overall appearance is stable. Vertebral
body height is well maintained. No acute bony abnormality is seen.
IMPRESSION: Stable postoperative changes as described.

## 2022-03-21 DIAGNOSIS — Z302 Encounter for sterilization: Secondary | ICD-10-CM | POA: Diagnosis not present

## 2022-06-11 DIAGNOSIS — I10 Essential (primary) hypertension: Secondary | ICD-10-CM | POA: Diagnosis not present

## 2022-06-11 DIAGNOSIS — F411 Generalized anxiety disorder: Secondary | ICD-10-CM | POA: Diagnosis not present

## 2022-07-17 DIAGNOSIS — N50811 Right testicular pain: Secondary | ICD-10-CM | POA: Diagnosis not present

## 2022-12-03 DIAGNOSIS — M5451 Vertebrogenic low back pain: Secondary | ICD-10-CM | POA: Diagnosis not present

## 2022-12-17 DIAGNOSIS — G4733 Obstructive sleep apnea (adult) (pediatric): Secondary | ICD-10-CM | POA: Diagnosis not present

## 2022-12-22 DIAGNOSIS — Z13220 Encounter for screening for lipoid disorders: Secondary | ICD-10-CM | POA: Diagnosis not present

## 2022-12-22 DIAGNOSIS — I10 Essential (primary) hypertension: Secondary | ICD-10-CM | POA: Diagnosis not present

## 2022-12-22 DIAGNOSIS — G4733 Obstructive sleep apnea (adult) (pediatric): Secondary | ICD-10-CM | POA: Diagnosis not present

## 2022-12-22 DIAGNOSIS — F411 Generalized anxiety disorder: Secondary | ICD-10-CM | POA: Diagnosis not present

## 2022-12-22 DIAGNOSIS — R600 Localized edema: Secondary | ICD-10-CM | POA: Diagnosis not present

## 2022-12-22 DIAGNOSIS — Z0000 Encounter for general adult medical examination without abnormal findings: Secondary | ICD-10-CM | POA: Diagnosis not present

## 2022-12-22 DIAGNOSIS — Z125 Encounter for screening for malignant neoplasm of prostate: Secondary | ICD-10-CM | POA: Diagnosis not present

## 2023-01-07 DIAGNOSIS — G4733 Obstructive sleep apnea (adult) (pediatric): Secondary | ICD-10-CM | POA: Diagnosis not present

## 2023-01-08 DIAGNOSIS — M5451 Vertebrogenic low back pain: Secondary | ICD-10-CM | POA: Diagnosis not present

## 2023-01-16 DIAGNOSIS — N401 Enlarged prostate with lower urinary tract symptoms: Secondary | ICD-10-CM | POA: Diagnosis not present

## 2023-01-16 DIAGNOSIS — R351 Nocturia: Secondary | ICD-10-CM | POA: Diagnosis not present

## 2023-01-23 DIAGNOSIS — N50811 Right testicular pain: Secondary | ICD-10-CM | POA: Diagnosis not present

## 2023-01-23 DIAGNOSIS — R351 Nocturia: Secondary | ICD-10-CM | POA: Diagnosis not present

## 2023-01-23 DIAGNOSIS — N401 Enlarged prostate with lower urinary tract symptoms: Secondary | ICD-10-CM | POA: Diagnosis not present

## 2023-01-23 DIAGNOSIS — Z125 Encounter for screening for malignant neoplasm of prostate: Secondary | ICD-10-CM | POA: Diagnosis not present

## 2023-01-30 DIAGNOSIS — R6882 Decreased libido: Secondary | ICD-10-CM | POA: Diagnosis not present

## 2023-02-06 DIAGNOSIS — G4733 Obstructive sleep apnea (adult) (pediatric): Secondary | ICD-10-CM | POA: Diagnosis not present

## 2023-03-09 DIAGNOSIS — G4733 Obstructive sleep apnea (adult) (pediatric): Secondary | ICD-10-CM | POA: Diagnosis not present

## 2023-03-20 DIAGNOSIS — E349 Endocrine disorder, unspecified: Secondary | ICD-10-CM | POA: Diagnosis not present

## 2023-03-20 DIAGNOSIS — N5201 Erectile dysfunction due to arterial insufficiency: Secondary | ICD-10-CM | POA: Diagnosis not present

## 2023-03-20 DIAGNOSIS — N401 Enlarged prostate with lower urinary tract symptoms: Secondary | ICD-10-CM | POA: Diagnosis not present

## 2023-03-20 DIAGNOSIS — R351 Nocturia: Secondary | ICD-10-CM | POA: Diagnosis not present

## 2023-03-30 DIAGNOSIS — R6882 Decreased libido: Secondary | ICD-10-CM | POA: Diagnosis not present

## 2023-04-02 DIAGNOSIS — G4733 Obstructive sleep apnea (adult) (pediatric): Secondary | ICD-10-CM | POA: Diagnosis not present

## 2023-04-06 DIAGNOSIS — R6882 Decreased libido: Secondary | ICD-10-CM | POA: Diagnosis not present

## 2023-04-08 DIAGNOSIS — G4733 Obstructive sleep apnea (adult) (pediatric): Secondary | ICD-10-CM | POA: Diagnosis not present

## 2023-04-23 DIAGNOSIS — E349 Endocrine disorder, unspecified: Secondary | ICD-10-CM | POA: Diagnosis not present

## 2023-06-22 DIAGNOSIS — I10 Essential (primary) hypertension: Secondary | ICD-10-CM | POA: Diagnosis not present

## 2023-06-22 DIAGNOSIS — E291 Testicular hypofunction: Secondary | ICD-10-CM | POA: Diagnosis not present

## 2023-06-22 DIAGNOSIS — F411 Generalized anxiety disorder: Secondary | ICD-10-CM | POA: Diagnosis not present

## 2023-07-27 DIAGNOSIS — E349 Endocrine disorder, unspecified: Secondary | ICD-10-CM | POA: Diagnosis not present

## 2023-08-05 DIAGNOSIS — N5201 Erectile dysfunction due to arterial insufficiency: Secondary | ICD-10-CM | POA: Diagnosis not present

## 2023-08-05 DIAGNOSIS — E349 Endocrine disorder, unspecified: Secondary | ICD-10-CM | POA: Diagnosis not present

## 2023-08-05 DIAGNOSIS — N401 Enlarged prostate with lower urinary tract symptoms: Secondary | ICD-10-CM | POA: Diagnosis not present

## 2023-08-05 DIAGNOSIS — R351 Nocturia: Secondary | ICD-10-CM | POA: Diagnosis not present

## 2023-08-14 DIAGNOSIS — E349 Endocrine disorder, unspecified: Secondary | ICD-10-CM | POA: Diagnosis not present

## 2023-08-21 DIAGNOSIS — E349 Endocrine disorder, unspecified: Secondary | ICD-10-CM | POA: Diagnosis not present

## 2023-09-30 DIAGNOSIS — E349 Endocrine disorder, unspecified: Secondary | ICD-10-CM | POA: Diagnosis not present

## 2023-11-30 DIAGNOSIS — G4733 Obstructive sleep apnea (adult) (pediatric): Secondary | ICD-10-CM | POA: Diagnosis not present

## 2024-01-07 NOTE — Progress Notes (Signed)
 Jason Moore                                          MRN: [id number]   01/07/2024   The [facility] Quality Team Specialist reviewed this patient medical record for the purposes of chart review for care gap closure. The following were reviewed: chart review for care gap closure-controlling blood pressure.    [facility] Quality Team

## 2024-01-12 DIAGNOSIS — E6609 Other obesity due to excess calories: Secondary | ICD-10-CM | POA: Diagnosis not present

## 2024-01-12 DIAGNOSIS — I10 Essential (primary) hypertension: Secondary | ICD-10-CM | POA: Diagnosis not present

## 2024-01-12 DIAGNOSIS — Z13220 Encounter for screening for lipoid disorders: Secondary | ICD-10-CM | POA: Diagnosis not present

## 2024-01-12 DIAGNOSIS — Z0000 Encounter for general adult medical examination without abnormal findings: Secondary | ICD-10-CM | POA: Diagnosis not present

## 2024-01-12 DIAGNOSIS — Z23 Encounter for immunization: Secondary | ICD-10-CM | POA: Diagnosis not present

## 2024-01-12 DIAGNOSIS — F411 Generalized anxiety disorder: Secondary | ICD-10-CM | POA: Diagnosis not present

## 2024-01-12 DIAGNOSIS — R600 Localized edema: Secondary | ICD-10-CM | POA: Diagnosis not present

## 2024-01-27 DIAGNOSIS — E349 Endocrine disorder, unspecified: Secondary | ICD-10-CM | POA: Diagnosis not present

## 2024-02-01 DIAGNOSIS — E291 Testicular hypofunction: Secondary | ICD-10-CM | POA: Diagnosis not present

## 2024-02-01 DIAGNOSIS — N5203 Combined arterial insufficiency and corporo-venous occlusive erectile dysfunction: Secondary | ICD-10-CM | POA: Diagnosis not present

## 2024-02-05 DIAGNOSIS — E291 Testicular hypofunction: Secondary | ICD-10-CM | POA: Diagnosis not present

## 2024-03-28 DIAGNOSIS — G4733 Obstructive sleep apnea (adult) (pediatric): Secondary | ICD-10-CM | POA: Diagnosis not present
